# Patient Record
Sex: Female | Born: 1955
Health system: Southern US, Community
[De-identification: ages and names within clinical notes are randomized; demographics above are authoritative.]

## PROBLEM LIST (undated history)

## (undated) DIAGNOSIS — Z803 Family history of malignant neoplasm of breast: Secondary | ICD-10-CM

## (undated) DIAGNOSIS — Q211 Atrial septal defect: Secondary | ICD-10-CM

## (undated) DIAGNOSIS — J321 Chronic frontal sinusitis: Secondary | ICD-10-CM

## (undated) DIAGNOSIS — L409 Psoriasis, unspecified: Secondary | ICD-10-CM

## (undated) DIAGNOSIS — I10 Essential (primary) hypertension: Secondary | ICD-10-CM

## (undated) DIAGNOSIS — E2839 Other primary ovarian failure: Secondary | ICD-10-CM

## (undated) DIAGNOSIS — Z923 Personal history of irradiation: Secondary | ICD-10-CM

## (undated) DIAGNOSIS — K5010 Crohn's disease of large intestine without complications: Secondary | ICD-10-CM

## (undated) DIAGNOSIS — G4730 Sleep apnea, unspecified: Secondary | ICD-10-CM

## (undated) DIAGNOSIS — N952 Postmenopausal atrophic vaginitis: Secondary | ICD-10-CM

## (undated) DIAGNOSIS — F32A Depression, unspecified: Secondary | ICD-10-CM

## (undated) DIAGNOSIS — F909 Attention-deficit hyperactivity disorder, unspecified type: Secondary | ICD-10-CM

## (undated) DIAGNOSIS — K589 Irritable bowel syndrome without diarrhea: Secondary | ICD-10-CM

## (undated) DIAGNOSIS — F324 Major depressive disorder, single episode, in partial remission: Secondary | ICD-10-CM

## (undated) DIAGNOSIS — R42 Dizziness and giddiness: Secondary | ICD-10-CM

## (undated) DIAGNOSIS — I2510 Atherosclerotic heart disease of native coronary artery without angina pectoris: Secondary | ICD-10-CM

## (undated) DIAGNOSIS — Z853 Personal history of malignant neoplasm of breast: Secondary | ICD-10-CM

## (undated) DIAGNOSIS — E785 Hyperlipidemia, unspecified: Secondary | ICD-10-CM

## (undated) DIAGNOSIS — G8929 Other chronic pain: Secondary | ICD-10-CM

## (undated) DIAGNOSIS — Q2112 Patent foramen ovale: Secondary | ICD-10-CM

## (undated) DIAGNOSIS — K219 Gastro-esophageal reflux disease without esophagitis: Secondary | ICD-10-CM

## (undated) DIAGNOSIS — R0602 Shortness of breath: Secondary | ICD-10-CM

## (undated) DIAGNOSIS — I700 Atherosclerosis of aorta: Secondary | ICD-10-CM

## (undated) DIAGNOSIS — R931 Abnormal findings on diagnostic imaging of heart and coronary circulation: Secondary | ICD-10-CM

## (undated) DIAGNOSIS — M8580 Other specified disorders of bone density and structure, unspecified site: Secondary | ICD-10-CM

## (undated) DIAGNOSIS — R059 Cough, unspecified: Secondary | ICD-10-CM

## (undated) DIAGNOSIS — I4891 Unspecified atrial fibrillation: Secondary | ICD-10-CM

## (undated) DIAGNOSIS — G4733 Obstructive sleep apnea (adult) (pediatric): Secondary | ICD-10-CM

## (undated) DIAGNOSIS — D649 Anemia, unspecified: Secondary | ICD-10-CM

## (undated) DIAGNOSIS — J8489 Other specified interstitial pulmonary diseases: Secondary | ICD-10-CM

## (undated) DIAGNOSIS — J849 Interstitial pulmonary disease, unspecified: Secondary | ICD-10-CM

## (undated) DIAGNOSIS — M545 Low back pain, unspecified: Secondary | ICD-10-CM

## (undated) DIAGNOSIS — E039 Hypothyroidism, unspecified: Secondary | ICD-10-CM

## (undated) DIAGNOSIS — R202 Paresthesia of skin: Secondary | ICD-10-CM

## (undated) DIAGNOSIS — K220 Achalasia of cardia: Secondary | ICD-10-CM

## (undated) DIAGNOSIS — E7800 Pure hypercholesterolemia, unspecified: Secondary | ICD-10-CM

## (undated) DIAGNOSIS — J479 Bronchiectasis, uncomplicated: Secondary | ICD-10-CM

## (undated) DIAGNOSIS — M5416 Radiculopathy, lumbar region: Secondary | ICD-10-CM

## (undated) DIAGNOSIS — C50919 Malignant neoplasm of unspecified site of unspecified female breast: Secondary | ICD-10-CM

## (undated) DIAGNOSIS — F439 Reaction to severe stress, unspecified: Secondary | ICD-10-CM

## (undated) DIAGNOSIS — F419 Anxiety disorder, unspecified: Secondary | ICD-10-CM

## (undated) HISTORY — DX: Obstructive sleep apnea (adult) (pediatric): G47.33

## (undated) HISTORY — DX: Other specified interstitial pulmonary diseases: J84.89

## (undated) HISTORY — DX: Postmenopausal atrophic vaginitis: N95.2

## (undated) HISTORY — DX: Irritable bowel syndrome without diarrhea: K58.9

## (undated) HISTORY — DX: Reaction to severe stress, unspecified: F43.9

## (undated) HISTORY — DX: Bronchiectasis, uncomplicated: J47.9

## (undated) HISTORY — DX: Depression, unspecified: F32.A

## (undated) HISTORY — DX: Interstitial pulmonary disease, unspecified: J84.9

## (undated) HISTORY — PX: FACIAL COSMETIC SURGERY: SHX629

## (undated) HISTORY — DX: Other specified disorders of bone density and structure, unspecified site: M85.80

## (undated) HISTORY — PX: BREAST BIOPSY: SHX20

## (undated) HISTORY — DX: Low back pain, unspecified: M54.50

## (undated) HISTORY — DX: Gastro-esophageal reflux disease without esophagitis: K21.9

## (undated) HISTORY — DX: Family history of malignant neoplasm of breast: Z80.3

## (undated) HISTORY — DX: Anemia, unspecified: D64.9

## (undated) HISTORY — PX: BREAST LUMPECTOMY: SHX2

## (undated) HISTORY — DX: Chronic frontal sinusitis: J32.1

## (undated) HISTORY — DX: Radiculopathy, lumbar region: M54.16

## (undated) HISTORY — DX: Shortness of breath: R06.02

## (undated) HISTORY — DX: Other primary ovarian failure: E28.39

## (undated) HISTORY — DX: Achalasia of cardia: K22.0

## (undated) HISTORY — DX: Hyperlipidemia, unspecified: E78.5

## (undated) HISTORY — DX: Cough, unspecified: R05.9

## (undated) HISTORY — DX: Major depressive disorder, single episode, in partial remission: F32.4

## (undated) HISTORY — DX: Other chronic pain: G89.29

## (undated) HISTORY — DX: Essential (primary) hypertension: I10

## (undated) HISTORY — DX: Psoriasis, unspecified: L40.9

## (undated) HISTORY — DX: Crohn's disease of large intestine without complications: K50.10

## (undated) HISTORY — DX: Paresthesia of skin: R20.2

## (undated) HISTORY — DX: Pure hypercholesterolemia, unspecified: E78.00

## (undated) HISTORY — DX: Hypothyroidism, unspecified: E03.9

## (undated) HISTORY — PX: TUBAL LIGATION: SHX77

## (undated) HISTORY — PX: REDUCTION MAMMAPLASTY: SUR839

## (undated) HISTORY — DX: Malignant neoplasm of unspecified site of unspecified female breast: C50.919

## (undated) HISTORY — DX: Unspecified atrial fibrillation: I48.91

## (undated) HISTORY — DX: Personal history of malignant neoplasm of breast: Z85.3

## (undated) HISTORY — DX: Dizziness and giddiness: R42

---
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
## 1998-09-25 ENCOUNTER — Encounter: Payer: Self-pay | Admitting: Pulmonary Disease

## 1998-09-25 ENCOUNTER — Ambulatory Visit (HOSPITAL_COMMUNITY): Admission: RE | Admit: 1998-09-25 | Discharge: 1998-09-25 | Payer: Self-pay | Admitting: Pulmonary Disease

## 1998-09-28 ENCOUNTER — Ambulatory Visit (HOSPITAL_COMMUNITY): Admission: RE | Admit: 1998-09-28 | Discharge: 1998-09-28 | Payer: Self-pay | Admitting: Pulmonary Disease

## 1999-01-10 ENCOUNTER — Encounter: Payer: Self-pay | Admitting: Pulmonary Disease

## 1999-01-10 ENCOUNTER — Encounter (INDEPENDENT_AMBULATORY_CARE_PROVIDER_SITE_OTHER): Payer: Self-pay

## 1999-01-10 ENCOUNTER — Ambulatory Visit (HOSPITAL_COMMUNITY): Admission: RE | Admit: 1999-01-10 | Discharge: 1999-01-10 | Payer: Self-pay | Admitting: Pulmonary Disease

## 1999-04-23 HISTORY — PX: BREAST LUMPECTOMY: SHX2

## 2000-01-16 ENCOUNTER — Encounter: Admission: RE | Admit: 2000-01-16 | Discharge: 2000-01-16 | Payer: Self-pay | Admitting: Gynecology

## 2000-01-16 ENCOUNTER — Encounter: Payer: Self-pay | Admitting: Gynecology

## 2000-01-21 ENCOUNTER — Other Ambulatory Visit: Admission: RE | Admit: 2000-01-21 | Discharge: 2000-01-21 | Payer: Self-pay | Admitting: Gynecology

## 2000-01-21 ENCOUNTER — Encounter: Payer: Self-pay | Admitting: Gynecology

## 2000-01-21 ENCOUNTER — Encounter: Admission: RE | Admit: 2000-01-21 | Discharge: 2000-01-21 | Payer: Self-pay | Admitting: Gynecology

## 2000-01-21 ENCOUNTER — Encounter (INDEPENDENT_AMBULATORY_CARE_PROVIDER_SITE_OTHER): Payer: Self-pay | Admitting: *Deleted

## 2000-01-24 ENCOUNTER — Ambulatory Visit (HOSPITAL_BASED_OUTPATIENT_CLINIC_OR_DEPARTMENT_OTHER): Admission: RE | Admit: 2000-01-24 | Discharge: 2000-01-24 | Payer: Self-pay

## 2000-01-30 ENCOUNTER — Encounter: Admission: RE | Admit: 2000-01-30 | Discharge: 2000-04-29 | Payer: Self-pay | Admitting: Radiation Oncology

## 2000-05-30 ENCOUNTER — Ambulatory Visit (HOSPITAL_COMMUNITY): Admission: RE | Admit: 2000-05-30 | Discharge: 2000-05-30 | Payer: Self-pay | Admitting: Gynecology

## 2000-07-10 ENCOUNTER — Encounter: Admission: RE | Admit: 2000-07-10 | Discharge: 2000-07-10 | Payer: Self-pay

## 2000-11-18 ENCOUNTER — Other Ambulatory Visit: Admission: RE | Admit: 2000-11-18 | Discharge: 2000-11-18 | Payer: Self-pay | Admitting: Gynecology

## 2001-03-27 ENCOUNTER — Encounter: Admission: RE | Admit: 2001-03-27 | Discharge: 2001-03-27 | Payer: Self-pay | Admitting: Family Medicine

## 2001-03-27 ENCOUNTER — Encounter: Payer: Self-pay | Admitting: Family Medicine

## 2002-01-18 ENCOUNTER — Other Ambulatory Visit: Admission: RE | Admit: 2002-01-18 | Discharge: 2002-01-18 | Payer: Self-pay | Admitting: Gynecology

## 2002-04-01 ENCOUNTER — Encounter: Payer: Self-pay | Admitting: Family Medicine

## 2002-04-01 ENCOUNTER — Encounter: Admission: RE | Admit: 2002-04-01 | Discharge: 2002-04-01 | Payer: Self-pay | Admitting: Family Medicine

## 2003-01-24 ENCOUNTER — Other Ambulatory Visit: Admission: RE | Admit: 2003-01-24 | Discharge: 2003-01-24 | Payer: Self-pay | Admitting: Gynecology

## 2003-04-04 ENCOUNTER — Encounter: Admission: RE | Admit: 2003-04-04 | Discharge: 2003-04-04 | Payer: Self-pay | Admitting: Gynecology

## 2003-07-18 ENCOUNTER — Encounter: Admission: RE | Admit: 2003-07-18 | Discharge: 2003-07-18 | Payer: Self-pay | Admitting: Family Medicine

## 2004-01-27 ENCOUNTER — Other Ambulatory Visit: Admission: RE | Admit: 2004-01-27 | Discharge: 2004-01-27 | Payer: Self-pay | Admitting: Gynecology

## 2004-04-06 ENCOUNTER — Ambulatory Visit (HOSPITAL_BASED_OUTPATIENT_CLINIC_OR_DEPARTMENT_OTHER): Admission: RE | Admit: 2004-04-06 | Discharge: 2004-04-06 | Payer: Self-pay | Admitting: Family Medicine

## 2004-04-17 ENCOUNTER — Encounter: Admission: RE | Admit: 2004-04-17 | Discharge: 2004-04-17 | Payer: Self-pay | Admitting: Family Medicine

## 2004-04-18 ENCOUNTER — Encounter: Admission: RE | Admit: 2004-04-18 | Discharge: 2004-04-18 | Payer: Self-pay | Admitting: Family Medicine

## 2005-02-06 ENCOUNTER — Other Ambulatory Visit: Admission: RE | Admit: 2005-02-06 | Discharge: 2005-02-06 | Payer: Self-pay | Admitting: Gynecology

## 2005-04-29 ENCOUNTER — Encounter: Admission: RE | Admit: 2005-04-29 | Discharge: 2005-04-29 | Payer: Self-pay | Admitting: Family Medicine

## 2005-10-04 ENCOUNTER — Ambulatory Visit: Payer: Self-pay | Admitting: Internal Medicine

## 2006-03-31 ENCOUNTER — Other Ambulatory Visit: Admission: RE | Admit: 2006-03-31 | Discharge: 2006-03-31 | Payer: Self-pay | Admitting: Gynecology

## 2006-05-23 ENCOUNTER — Ambulatory Visit (HOSPITAL_COMMUNITY): Admission: RE | Admit: 2006-05-23 | Discharge: 2006-05-23 | Payer: Self-pay | Admitting: Gastroenterology

## 2006-06-03 ENCOUNTER — Encounter: Admission: RE | Admit: 2006-06-03 | Discharge: 2006-06-03 | Payer: Self-pay | Admitting: Gynecology

## 2007-02-25 ENCOUNTER — Encounter: Admission: RE | Admit: 2007-02-25 | Discharge: 2007-02-25 | Payer: Self-pay | Admitting: Family Medicine

## 2007-06-10 ENCOUNTER — Encounter: Admission: RE | Admit: 2007-06-10 | Discharge: 2007-06-10 | Payer: Self-pay | Admitting: Family Medicine

## 2008-06-10 ENCOUNTER — Encounter: Admission: RE | Admit: 2008-06-10 | Discharge: 2008-06-10 | Payer: Self-pay | Admitting: Gynecology

## 2009-06-12 ENCOUNTER — Encounter: Admission: RE | Admit: 2009-06-12 | Discharge: 2009-06-12 | Payer: Self-pay | Admitting: Family Medicine

## 2010-05-13 ENCOUNTER — Encounter: Payer: Self-pay | Admitting: Gynecology

## 2010-06-07 ENCOUNTER — Other Ambulatory Visit: Payer: Self-pay | Admitting: Gynecology

## 2010-06-21 ENCOUNTER — Other Ambulatory Visit: Payer: Self-pay | Admitting: Family Medicine

## 2010-06-21 DIAGNOSIS — Z1231 Encounter for screening mammogram for malignant neoplasm of breast: Secondary | ICD-10-CM

## 2010-07-11 ENCOUNTER — Ambulatory Visit: Payer: Self-pay

## 2010-08-30 ENCOUNTER — Ambulatory Visit
Admission: RE | Admit: 2010-08-30 | Discharge: 2010-08-30 | Disposition: A | Discharge status: Home / Self Care | Payer: BC Managed Care – PPO | Source: Ambulatory Visit | Attending: Family Medicine | Admitting: Family Medicine

## 2010-08-30 DIAGNOSIS — Z1231 Encounter for screening mammogram for malignant neoplasm of breast: Secondary | ICD-10-CM

## 2010-09-03 ENCOUNTER — Other Ambulatory Visit: Payer: Self-pay | Admitting: Gynecology

## 2010-09-07 NOTE — Op Note (Signed)
[place]  Patient:    Amber Ross, Amber Ross                       MRN: [id number] Proc. Date: 01/24/00 Adm. Date:  [id number] Attending:  Gennie Alma CC:         Gretta Cool, M.D.  Desma Maxim, M.D.  Fabio Asa, M.D.   Operative Report  CCS# [id number]  PREOPERATIVE DIAGNOSIS:  Intraductal carcinoma of left breast associated with abnormal calcifications--12 oclock radial.  POSTOPERATIVE DIAGNOSIS:  Intraductal carcinoma of left breast associated with abnormal calcifications--12 oclock radial.  OPERATION PERFORMED:  Needle localized left breast biopsy.  SURGEON:  Milus Mallick, M.D.  ANESTHESIA:  Local infiltration with 1% Xylocaine 25 cc and monitored anesthesia care.  DESCRIPTION OF PROCEDURE:  Under adequate perioperative intravenous sedation, the patients left breast was prepped and draped in the usual fashion.  There was a skinny wire protruding from the 12 oclock radial of the left breast having been placed there by Dr. Fabio Asa for the purposes of localizing some abnormal calcifications that had been biopsied by her and found to contain intraductal carcinoma.  Just adjacent to the wire was a large core needle tract of the previous biopsy.  An elliptical incision was made to include the wire and the needle tract and it was approximately 6 cm in length. Bleeders were electrocoagulated.  Superior and inferior skin flaps were developed using scalpel.  Next, a segment of breast tissue was excised to include the wire and surrounding tissue.  Bleeders were electrocoagulated and in one incidence suture ligated with 3-0 chromic catgut.  The specimen was sent for speciman mammography which confirmed the presence of all the calcifications within the biopsy site.  Hemostasis was ascertained. Subcuticular layer was reapproximated with continuous suture of 5-0 Vicryl. Half-inch Steri-Strips were applied to the skin,  sterile dressing was applied. Estimated blood loss for this procedure was approximately 100 cc.  The patient tolerated the procedure well and left the operating room in satisfactory condition. DD:  01/24/00 TD:  01/25/00 Job: [id number]

## 2010-09-07 NOTE — Op Note (Signed)
[facility]  Patient:    Amber Ross, Amber Ross                       MRN: [id number] Proc. Date: 05/30/00 Adm. Date:  [id number] Disc. Date: [id number] Attending:  Katrina Stack CC:         Desma Maxim, M.D.   Operative Report  PREOPERATIVE DIAGNOSES: 1. Desires sterilization. 2. Breast cancer, no longer candidate for birth control pills.  POSTOPERATIVE DIAGNOSES: 1. Desires sterilization. 2. Breast cancer, no longer candidate for birth control pills.  PROCEDURE:  Filches clip sterilization.  SURGEON:  Gretta Cool, M.D.  ANESTHESIA:  IV sedation plus topical and local infiltration with Marcaine 0.5%.  DESCRIPTION OF PROCEDURE:  Under excellent IV sedation with local infiltration and application as above the patients abdomen was prepped and draped as a sterile field.  Allen stirrups were used.  Her bladder was emptied.  Hulka tenaculum was applied to her cervix and patient prepped and draped for laparoscopy.  The skin was then infiltrated with Marcaine 0.5%.  The infiltration was taken to the level of the peritoneum.  Veress cannula was then introduced and adequate pneumoperitoneum obtained with nitrous oxide. The abdominal peritoneum was then entered by the laparoscope trocar and pelvic organs visualized.  There were some significant adhesions over the right ovary, filmy in nature.  No evidence of endometriosis, ovarian disease, or any other intra-abdominal pathology was identified.  At this point the tubes were traced to the fimbriated end.  They were then grasped approximately 1 cm lateral to the uterine insertion and filches clip band sterilization clip applied across the mesosalpinx so that the full width of fallopian tube was included in the clip.  Photographs were made.  At this point the gas was allowed to escape and the incision closed with deep suture of 5-0 Dexon, skin closed with Steri-Strips.  At the end of the procedure  sponge and lap counts were correct.  No complications.  Patient returned to recovery room in excellent condition.DD:  05/30/00 TD:  06/01/00 Job: [id number]

## 2010-09-07 NOTE — Procedures (Signed)
NAME:  Amber Ross, Amber Ross                ACCOUNT NO.:  [id number]   MEDICAL RECORD NO.:  [id number]          PATIENT TYPE:  OUT   LOCATION:  SLEEP CENTER                 FACILITY:  [facility]   PHYSICIAN:  Clinton D. Maple Hudson, M.D. DATE OF BIRTH:  March 11, 1956   DATE OF STUDY:  04/06/2004                              NOCTURNAL POLYSOMNOGRAM   STUDY DATE:  April 06, 2004   REFERRING PHYSICIAN:  Donia Guiles, M.D.   INDICATION FOR STUDY:  Hypersomnia with sleep apnea.   NECK SIZE:  14-1/2 inches   BODY MASS INDEX:  28.7   WEIGHT:  190 pounds   EPWORTH SLEEPINESS SCORE:  12/24   SLEEP ARCHITECTURE:  Total sleep time 302 minutes with sleep efficiency 67%.  Stage I was 17%, stage II 77%, stages III and IV were absent, REM was 7% of  total sleep time.  Sleep latency was 19 minutes, REM latency was 405  minutes, awake after sleep onset 129 minutes, arousal index 51.2 which is  increased.   RESPIRATORY DATA:  Split-study protocol.  RDI 7.3/hr indicating mild  obstructive sleep apnea/hypopnea syndrome before CPAP.  This reflected 15  hypopneas before CPAP.  Events were not positional.  REM RDI was 5.9/hr.  CPAP was titrated to 9 CWP, RDI 2.3/hr using a small Respironics ComfortGel  Mask.  The technician suggested use of a chin strap or full face mask  because of leaks during REM but it may be that a lower pressure would be a  better approach, 8 CWP gave an RDI of 2.7/hr.   OXYGEN DATA:  Moderate snoring with oxygen desaturation to a nadir of 92%  during events.  Mean oxygen saturation through the study was 97%.   CARDIAC DATA:  Normal sinus rhythm.   MOVEMENT/PARASOMNIA:  A total of 93 limb jerks were recorded of which 33  were associated with arousal or awakening for a periodic limb movement with  arousal index of 6.5/hr which is mildly increased and possibly clinically  significant.   IMPRESSION/RECOMMENDATION:  1.  Very mild obstructive sleep apnea/hypopnea syndrome, respiratory  disturbance index 7.3/hr reflecting nonpositional hypopneas with normal      oxygenation.  Continuous positive airway pressure was titrated to 9 CWP,      respiratory disturbance index of 2.3/hr.  There were a few air leaks      reported at this pressure and it may be best to start at home with 8 CWP      if continuous positive airway pressure therapy is to be utilized.  The      technician used a small Respironics ComfortGel Mask.  2.  Periodic limb movement with arousal, PLMA 6.5/hr.                                                           Joni Fears D. Maple Hudson, M.D.  Diplomate, [employer] Board   CDY/MEDQ  D:  04/15/2004 56:43:32  T:  04/15/2004 18:45:46  Job:  [id number] 

## 2011-09-09 ENCOUNTER — Other Ambulatory Visit: Payer: Self-pay | Admitting: Family Medicine

## 2011-09-09 DIAGNOSIS — Z1231 Encounter for screening mammogram for malignant neoplasm of breast: Secondary | ICD-10-CM

## 2011-09-11 ENCOUNTER — Other Ambulatory Visit: Payer: Self-pay | Admitting: Gynecology

## 2011-09-17 ENCOUNTER — Ambulatory Visit
Admission: RE | Admit: 2011-09-17 | Discharge: 2011-09-17 | Disposition: A | Discharge status: Home / Self Care | Payer: BC Managed Care – PPO | Source: Ambulatory Visit | Attending: Family Medicine | Admitting: Family Medicine

## 2011-09-17 DIAGNOSIS — Z1231 Encounter for screening mammogram for malignant neoplasm of breast: Secondary | ICD-10-CM

## 2012-11-20 ENCOUNTER — Other Ambulatory Visit: Payer: Self-pay

## 2012-11-20 DIAGNOSIS — Z1231 Encounter for screening mammogram for malignant neoplasm of breast: Secondary | ICD-10-CM

## 2012-12-09 ENCOUNTER — Ambulatory Visit
Admission: RE | Admit: 2012-12-09 | Discharge: 2012-12-09 | Disposition: A | Discharge status: Home / Self Care | Payer: BC Managed Care – PPO | Source: Ambulatory Visit

## 2012-12-09 DIAGNOSIS — Z1231 Encounter for screening mammogram for malignant neoplasm of breast: Secondary | ICD-10-CM

## 2013-11-17 ENCOUNTER — Ambulatory Visit (INDEPENDENT_AMBULATORY_CARE_PROVIDER_SITE_OTHER)
Admission: RE | Admit: 2013-11-17 | Discharge: 2013-11-17 | Disposition: A | Discharge status: Home / Self Care | Payer: BC Managed Care – PPO | Source: Ambulatory Visit | Attending: Internal Medicine | Admitting: Internal Medicine

## 2013-11-17 ENCOUNTER — Encounter: Payer: Self-pay | Admitting: Internal Medicine

## 2013-11-17 ENCOUNTER — Ambulatory Visit (INDEPENDENT_AMBULATORY_CARE_PROVIDER_SITE_OTHER): Payer: BC Managed Care – PPO | Admitting: Internal Medicine

## 2013-11-17 ENCOUNTER — Encounter (INDEPENDENT_AMBULATORY_CARE_PROVIDER_SITE_OTHER): Payer: Self-pay

## 2013-11-17 VITALS — BP 126/90 | HR 80 | Temp 98.2°F | Ht 68.0 in | Wt 187.0 lb

## 2013-11-17 DIAGNOSIS — R059 Cough, unspecified: Secondary | ICD-10-CM

## 2013-11-17 DIAGNOSIS — R05 Cough: Secondary | ICD-10-CM

## 2013-11-17 DIAGNOSIS — R058 Other specified cough: Secondary | ICD-10-CM

## 2013-11-17 MED ORDER — FAMOTIDINE 20 MG PO TABS: ORAL_TABLET | ORAL | Status: DC

## 2013-11-17 MED ORDER — PANTOPRAZOLE SODIUM 40 MG PO TBEC: 40.0000 mg | DELAYED_RELEASE_TABLET | Freq: Every day | ORAL | Status: DC

## 2013-11-17 MED ORDER — GABAPENTIN 100 MG PO CAPS: 100.0000 mg | ORAL_CAPSULE | Freq: Three times a day (TID) | ORAL | Status: DC

## 2013-11-17 NOTE — Patient Instructions (Addendum)
Neurontin (gabapentin) 100 mg three times daily  with meals   Pantoprazole (protonix) 40 mg   Take 30-60 min before first meal of the day and Pepcid 20 mg one bedtime until return to office - this is the best way to tell whether stomach acid is contributing to your problem.    For drainage/ tickle take chlortrimeton (chlorpheniramine) 4 mg every 4 hours available over the counter (may cause drowsiness)   GERD (REFLUX)  is an extremely common cause of respiratory symptoms, many times with no significant heartburn at all.    It can be treated with medication, but also with lifestyle changes including avoidance of late meals, excessive alcohol, smoking cessation, and avoid fatty foods, chocolate, peppermint, colas, red wine, and acidic juices such as orange juice.  NO MINT OR MENTHOL PRODUCTS SO NO COUGH DROPS  USE SUGARLESS CANDY INSTEAD (jolley ranchers or Stover's)  NO OIL BASED VITAMINS - use powdered substitutes.    Please remember to go to the x-ray department downstairs for your tests - we will call you with the results when they are available.  Please schedule a follow up office visit in 6 weeks, call sooner if needed

## 2013-11-17 NOTE — Progress Notes (Signed)
Subjective:    Patient ID: Amber Ross, female    DOB: Nov 14, 1955   MRN: [id number]  HPI  68 yowf quit smoking 1991 started cough in 1995 and ever since daily eval in pulmonary clinic around 2005 rx for UACS "never better"even transiently referred back to pulmonary clinic by Dr Derrill Memo on 11/17/2013     11/17/2013 1st [facility] office visit/ Amber Ross  Chief Complaint  Patient presents with  . Pulmonary Consult    Self referral. Pt seen here approx 10 yrs ago. She c/o cough for the past 22 yrs. She states that cough is non prod and "constant" but seems worse when she is working during the day.    dry cough is daily x 22 y, not present while supine or sleep Not limited by breathing from desired activities   ? Hb from excessive cough, also urinary incontinence    Kouffman Reflux v Neurogenic Cough Differentiator Reflux Comments  Do you awaken from a sound sleep coughing violently?                            With trouble breathing? no   Do you have choking episodes when you cannot  Get enough air, gasping for air ?              Yes   Do you usually cough when you lie down into  The bed, or when you just lie down to rest ?                          no   Do you usually cough after meals or eating?         no   Do you cough when (or after) you bend over?    no   GERD SCORE     Kouffman Reflux v Neurogenic Cough Differentiator Neurogenic   Do you more-or-less cough all day long? yes   Does change of temperature make you cough? yes   Does laughing or chuckling cause you to cough? yes   Do fumes (perfume, automobile fumes, burned  Toast, etc.,) cause you to cough ?      yes   Does speaking, singing, or talking on the phone cause you to cough   ?               Yes    Neurogenic/Airway score       No obvious other patterns in day to day or daytime variabilty or assoc  cp or chest tightness, subjective wheeze overt sinus  symptoms. No unusual exp hx or h/o childhood pna/ asthma or  knowledge of premature birth.  Sleeping ok without nocturnal  or early am exacerbation  of respiratory  c/o's or need for noct saba. Also denies any obvious fluctuation of symptoms with weather or environmental changes or other aggravating or alleviating factors except as outlined above   Current Medications, Allergies, Complete Past Medical History, Past Surgical History, Family History, and Social History were reviewed in [employer] record.           Review of Systems  Constitutional: Negative for fever, chills and unexpected weight change.  HENT: Negative for congestion, dental problem, ear pain, nosebleeds, postnasal drip, rhinorrhea, sinus pressure, sneezing, sore throat, trouble swallowing and voice change.   Eyes: Negative for visual disturbance.  Respiratory: Positive for cough. Negative for choking and shortness  of breath.   Cardiovascular: Negative for chest pain and leg swelling.  Gastrointestinal: Negative for vomiting, abdominal pain and diarrhea.  Genitourinary: Negative for difficulty urinating.       Acid heartburn  Musculoskeletal: Negative for arthralgias.  Skin: Negative for rash.  Neurological: Negative for tremors, syncope and headaches.  Hematological: Does not bruise/bleed easily.       Objective:   Physical Exam  amb wm   Wt Readings from Last 3 Encounters:  11/17/13 187 lb (84.823 kg)      HEENT: nl dentition, turbinates, and orophanx. Nl external ear canals without cough reflex   NECK :  without JVD/Nodes/TM/ nl carotid upstrokes bilaterally   LUNGS: no acc muscle use, clear to A and P bilaterally without cough on insp or exp maneuvers   CV:  RRR  no s3 or murmur or increase in P2, no edema   ABD:  soft and nontender with nl excursion in the supine position. No bruits or organomegaly, bowel sounds nl  MS:  warm without deformities, calf tenderness, cyanosis or clubbing  SKIN: warm and dry without lesions    NEURO:   alert, approp, no deficits    CXR  11/17/2013 :  No active cardiopulmonary disease.        Assessment & Plan:

## 2013-11-17 NOTE — Progress Notes (Signed)
Quick Note:  Spoke with pt and notified of results per Dr. Wert. Pt verbalized understanding and denied any questions.  ______ 

## 2013-11-18 NOTE — Assessment & Plan Note (Signed)
The most common causes of chronic cough in immunocompetent adults include the following: upper airway cough syndrome (UACS), previously referred to as postnasal drip syndrome (PNDS), which is caused by variety of rhinosinus conditions; (2) asthma; (3) GERD; (4) chronic bronchitis from cigarette smoking or other inhaled environmental irritants; (5) nonasthmatic eosinophilic bronchitis; and (6) bronchiectasis.   These conditions, singly or in combination, have accounted for up to 94% of the causes of chronic cough in prospective studies.   Other conditions have constituted no >6% of the causes in prospective studies These have included bronchogenic carcinoma, chronic interstitial pneumonia, sarcoidosis, left ventricular failure, ACEI-induced cough, and aspiration from a condition associated with pharyngeal dysfunction.    Chronic cough is often simultaneously caused by more than one condition. A single cause has been found from 38 to 82% of the time, multiple causes from 18 to 62%. Multiply caused cough has been the result of three diseases up to 42% of the time.       Based on hx and exam, this is most likely:  Classic Upper airway cough syndrome, so named because it's frequently impossible to sort out how much is  CR/sinusitis with freq throat clearing (which can be related to primary GERD)   vs  causing  secondary (" extra esophageal")  GERD from wide swings in gastric pressure that occur with throat clearing, often  promoting self use of mint and menthol lozenges that reduce the lower esophageal sphincter tone and exacerbate the problem further in a cyclical fashion.   These are the same pts (now being labeled as having "irritable larynx syndrome" by some cough centers) who not infrequently have a history of having failed to tolerate ace inhibitors,  dry powder inhalers or biphosphonates or report having atypical reflux symptoms that don't respond to standard doses of PPI , and are easily confused as  having aecopd or asthma flares by even experienced allergists/ pulmonologists.   The first step is to maximize acid suppression and eliminate cyclical coughing with neurontin  then regroup in 4 weeks.  See instructions for specific recommendations which were reviewed directly with the patient who was given a copy with highlighter outlining the key components.

## 2013-12-15 ENCOUNTER — Other Ambulatory Visit: Payer: Self-pay

## 2013-12-15 DIAGNOSIS — Z1231 Encounter for screening mammogram for malignant neoplasm of breast: Secondary | ICD-10-CM

## 2013-12-29 ENCOUNTER — Ambulatory Visit: Payer: BC Managed Care – PPO | Admitting: Internal Medicine

## 2013-12-30 ENCOUNTER — Ambulatory Visit: Payer: BC Managed Care – PPO

## 2014-01-04 ENCOUNTER — Ambulatory Visit
Admission: RE | Admit: 2014-01-04 | Discharge: 2014-01-04 | Disposition: A | Discharge status: Home / Self Care | Payer: BC Managed Care – PPO | Source: Ambulatory Visit

## 2014-01-04 DIAGNOSIS — Z1231 Encounter for screening mammogram for malignant neoplasm of breast: Secondary | ICD-10-CM

## 2015-02-20 ENCOUNTER — Other Ambulatory Visit: Payer: Self-pay

## 2015-02-20 DIAGNOSIS — Z1231 Encounter for screening mammogram for malignant neoplasm of breast: Secondary | ICD-10-CM

## 2015-03-24 ENCOUNTER — Ambulatory Visit
Admission: RE | Admit: 2015-03-24 | Discharge: 2015-03-24 | Disposition: A | Discharge status: Home / Self Care | Payer: BLUE CROSS/BLUE SHIELD | Source: Ambulatory Visit

## 2015-03-24 DIAGNOSIS — Z1231 Encounter for screening mammogram for malignant neoplasm of breast: Secondary | ICD-10-CM

## 2015-08-10 ENCOUNTER — Ambulatory Visit (INDEPENDENT_AMBULATORY_CARE_PROVIDER_SITE_OTHER): Payer: BLUE CROSS/BLUE SHIELD | Admitting: Physician Assistant

## 2015-08-10 VITALS — BP 130/80 | HR 80 | Temp 97.8°F | Resp 16 | Ht 66.75 in | Wt 153.0 lb

## 2015-08-10 DIAGNOSIS — Z853 Personal history of malignant neoplasm of breast: Secondary | ICD-10-CM | POA: Diagnosis not present

## 2015-08-10 DIAGNOSIS — D509 Iron deficiency anemia, unspecified: Secondary | ICD-10-CM

## 2015-08-10 DIAGNOSIS — Z130 Encounter for screening for diseases of the blood and blood-forming organs and certain disorders involving the immune mechanism: Secondary | ICD-10-CM

## 2015-08-10 LAB — POCT CBC
GRANULOCYTE PERCENT: 61.8 % (ref 37–80)
HCT, POC: 41.5 % (ref 37.7–47.9)
Hemoglobin: 14.6 g/dL (ref 12.2–16.2)
Lymph, poc: 2.1 (ref 0.6–3.4)
MCH, POC: 29.1 pg (ref 27–31.2)
MCHC: 35.1 g/dL (ref 31.8–35.4)
MCV: 83 fL (ref 80–97)
MID (cbc): 0.4 (ref 0–0.9)
MPV: 9.6 fL (ref 0–99.8)
PLATELET COUNT, POC: 134 10*3/uL — AB (ref 142–424)
POC Granulocyte: 4.1 (ref 2–6.9)
POC LYMPH %: 31.6 % (ref 10–50)
POC MID %: 6.6 %M (ref 0–12)
RBC: 5 M/uL (ref 4.04–5.48)
RDW, POC: 13.9 %
WBC: 6.7 10*3/uL (ref 4.6–10.2)

## 2015-08-10 NOTE — Progress Notes (Signed)
   Amber Ross  MRN: [id number] DOB: 03-16-1956  Subjective:  Pt presents to clinic for lab work prior to an elective face lift.  She needs to have a CBC prior to her surgery.  Surgery is schedule for 5/8.  Patient Active Problem List   Diagnosis Date Noted  . History of breast cancer in female 08/10/2015  . Upper airway cough syndrome 11/17/2013    Current Outpatient Prescriptions on File Prior to Visit  Medication Sig Dispense Refill  . venlafaxine XR (EFFEXOR XR) 150 MG 24 hr capsule 1 capsule with food     No current facility-administered medications on file prior to visit.    No Known Allergies  Review of Systems  Constitutional: Negative for fever and chills.   Objective:  BP 130/80 mmHg  Pulse 80  Temp(Src) 97.8 F (36.6 C)  Resp 16  Ht 5' 6.75" (1.695 m)  Wt 153 lb (69.4 kg)  BMI 24.16 kg/m2  Physical Exam  Constitutional: She is oriented to person, place, and time and well-developed, well-nourished, and in no distress.  HENT:  Head: Normocephalic and atraumatic.  Right Ear: Hearing and external ear normal.  Left Ear: Hearing and external ear normal.  Eyes: Conjunctivae are normal.  Neck: Normal range of motion.  Pulmonary/Chest: Effort normal.  Neurological: She is alert and oriented to person, place, and time. Gait normal.  Skin: Skin is warm and dry.  Psychiatric: Mood, memory, affect and judgment normal.  Vitals reviewed.  Results for orders placed or performed in visit on 08/10/15  POCT CBC  Result Value Ref Range   WBC 6.7 4.6 - 10.2 K/uL   Lymph, poc 2.1 0.6 - 3.4   POC LYMPH PERCENT 31.6 10 - 50 %L   MID (cbc) 0.4 0 - 0.9   POC MID % 6.6 0 - 12 %M   POC Granulocyte 4.1 2 - 6.9   Granulocyte percent 61.8 37 - 80 %G   RBC 5.00 4.04 - 5.48 M/uL   Hemoglobin 14.6 12.2 - 16.2 g/dL   HCT, POC 41.5 37.7 - 47.9 %   MCV 83.0 80 - 97 fL   MCH, POC 29.1 27 - 31.2 pg   MCHC 35.1 31.8 - 35.4 g/dL   RDW, POC 13.9 %   Platelet Count, POC 134 (A)  142 - 424 K/uL   MPV 9.6 0 - 99.8 fL    Assessment and Plan :  Iron deficiency anemia, unspecified  History of breast cancer in female  Screening for deficiency anemia - Plan: POCT CBC   Gave copy of lab to patient as well as faxed to Dr Stephanie Coup.  Windell Hummingbird PA-C  [facility] 08/10/2015 9:28 AM

## 2015-08-10 NOTE — Patient Instructions (Signed)
     IF you received an x-ray today, you will receive an invoice from [facility]. Please contact [facility] at [phone] with questions or concerns regarding your invoice.   IF you received labwork today, you will receive an invoice from [facility]. Please contact [facility] at [phone] with questions or concerns regarding your invoice.   Our billing staff will not be able to assist you with questions regarding bills from these companies.  You will be contacted with the lab results as soon as they are available. The fastest way to get your results is to activate your My Chart account. Instructions are located on the last page of this paperwork. If you have not heard from us regarding the results in 2 weeks, please contact this office.      

## 2016-05-03 ENCOUNTER — Other Ambulatory Visit: Payer: Self-pay | Admitting: Gynecology

## 2016-05-03 DIAGNOSIS — Z1231 Encounter for screening mammogram for malignant neoplasm of breast: Secondary | ICD-10-CM

## 2016-05-10 ENCOUNTER — Ambulatory Visit: Payer: BLUE CROSS/BLUE SHIELD

## 2016-05-31 ENCOUNTER — Ambulatory Visit
Admission: RE | Admit: 2016-05-31 | Discharge: 2016-05-31 | Disposition: A | Discharge status: Home / Self Care | Payer: BLUE CROSS/BLUE SHIELD | Source: Ambulatory Visit | Attending: Gynecology | Admitting: Gynecology

## 2016-05-31 DIAGNOSIS — Z1231 Encounter for screening mammogram for malignant neoplasm of breast: Secondary | ICD-10-CM

## 2017-03-04 DIAGNOSIS — R6884 Jaw pain: Secondary | ICD-10-CM | POA: Diagnosis not present

## 2017-03-04 DIAGNOSIS — R1013 Epigastric pain: Secondary | ICD-10-CM | POA: Diagnosis not present

## 2017-03-04 DIAGNOSIS — K219 Gastro-esophageal reflux disease without esophagitis: Secondary | ICD-10-CM | POA: Diagnosis not present

## 2017-05-27 ENCOUNTER — Other Ambulatory Visit: Payer: Self-pay | Admitting: Gynecology

## 2017-05-27 DIAGNOSIS — Z1231 Encounter for screening mammogram for malignant neoplasm of breast: Secondary | ICD-10-CM

## 2017-06-13 ENCOUNTER — Ambulatory Visit
Admission: RE | Admit: 2017-06-13 | Discharge: 2017-06-13 | Disposition: A | Discharge status: Home / Self Care | Payer: BLUE CROSS/BLUE SHIELD | Source: Ambulatory Visit | Attending: Gynecology | Admitting: Gynecology

## 2017-06-13 DIAGNOSIS — Z1231 Encounter for screening mammogram for malignant neoplasm of breast: Secondary | ICD-10-CM | POA: Diagnosis not present

## 2017-06-13 HISTORY — DX: Personal history of irradiation: Z92.3

## 2017-06-24 DIAGNOSIS — Z1211 Encounter for screening for malignant neoplasm of colon: Secondary | ICD-10-CM | POA: Diagnosis not present

## 2017-06-24 DIAGNOSIS — K5900 Constipation, unspecified: Secondary | ICD-10-CM | POA: Diagnosis not present

## 2017-07-01 DIAGNOSIS — M9903 Segmental and somatic dysfunction of lumbar region: Secondary | ICD-10-CM | POA: Diagnosis not present

## 2017-07-01 DIAGNOSIS — M9901 Segmental and somatic dysfunction of cervical region: Secondary | ICD-10-CM | POA: Diagnosis not present

## 2017-07-01 DIAGNOSIS — M9902 Segmental and somatic dysfunction of thoracic region: Secondary | ICD-10-CM | POA: Diagnosis not present

## 2017-07-01 DIAGNOSIS — M47812 Spondylosis without myelopathy or radiculopathy, cervical region: Secondary | ICD-10-CM | POA: Diagnosis not present

## 2017-07-02 DIAGNOSIS — M9902 Segmental and somatic dysfunction of thoracic region: Secondary | ICD-10-CM | POA: Diagnosis not present

## 2017-07-02 DIAGNOSIS — M9903 Segmental and somatic dysfunction of lumbar region: Secondary | ICD-10-CM | POA: Diagnosis not present

## 2017-07-02 DIAGNOSIS — M9901 Segmental and somatic dysfunction of cervical region: Secondary | ICD-10-CM | POA: Diagnosis not present

## 2017-07-02 DIAGNOSIS — M47812 Spondylosis without myelopathy or radiculopathy, cervical region: Secondary | ICD-10-CM | POA: Diagnosis not present

## 2017-07-03 DIAGNOSIS — M9901 Segmental and somatic dysfunction of cervical region: Secondary | ICD-10-CM | POA: Diagnosis not present

## 2017-07-03 DIAGNOSIS — M9903 Segmental and somatic dysfunction of lumbar region: Secondary | ICD-10-CM | POA: Diagnosis not present

## 2017-07-03 DIAGNOSIS — M47812 Spondylosis without myelopathy or radiculopathy, cervical region: Secondary | ICD-10-CM | POA: Diagnosis not present

## 2017-07-03 DIAGNOSIS — M9902 Segmental and somatic dysfunction of thoracic region: Secondary | ICD-10-CM | POA: Diagnosis not present

## 2017-07-07 DIAGNOSIS — M9901 Segmental and somatic dysfunction of cervical region: Secondary | ICD-10-CM | POA: Diagnosis not present

## 2017-07-07 DIAGNOSIS — M9903 Segmental and somatic dysfunction of lumbar region: Secondary | ICD-10-CM | POA: Diagnosis not present

## 2017-07-07 DIAGNOSIS — M9902 Segmental and somatic dysfunction of thoracic region: Secondary | ICD-10-CM | POA: Diagnosis not present

## 2017-07-07 DIAGNOSIS — M47812 Spondylosis without myelopathy or radiculopathy, cervical region: Secondary | ICD-10-CM | POA: Diagnosis not present

## 2017-07-08 DIAGNOSIS — M9902 Segmental and somatic dysfunction of thoracic region: Secondary | ICD-10-CM | POA: Diagnosis not present

## 2017-07-08 DIAGNOSIS — M47812 Spondylosis without myelopathy or radiculopathy, cervical region: Secondary | ICD-10-CM | POA: Diagnosis not present

## 2017-07-08 DIAGNOSIS — M9901 Segmental and somatic dysfunction of cervical region: Secondary | ICD-10-CM | POA: Diagnosis not present

## 2017-07-08 DIAGNOSIS — M9903 Segmental and somatic dysfunction of lumbar region: Secondary | ICD-10-CM | POA: Diagnosis not present

## 2017-07-09 DIAGNOSIS — M47812 Spondylosis without myelopathy or radiculopathy, cervical region: Secondary | ICD-10-CM | POA: Diagnosis not present

## 2017-07-09 DIAGNOSIS — M9903 Segmental and somatic dysfunction of lumbar region: Secondary | ICD-10-CM | POA: Diagnosis not present

## 2017-07-09 DIAGNOSIS — M9901 Segmental and somatic dysfunction of cervical region: Secondary | ICD-10-CM | POA: Diagnosis not present

## 2017-07-09 DIAGNOSIS — M9902 Segmental and somatic dysfunction of thoracic region: Secondary | ICD-10-CM | POA: Diagnosis not present

## 2017-07-14 DIAGNOSIS — M9903 Segmental and somatic dysfunction of lumbar region: Secondary | ICD-10-CM | POA: Diagnosis not present

## 2017-07-14 DIAGNOSIS — M9902 Segmental and somatic dysfunction of thoracic region: Secondary | ICD-10-CM | POA: Diagnosis not present

## 2017-07-14 DIAGNOSIS — M9901 Segmental and somatic dysfunction of cervical region: Secondary | ICD-10-CM | POA: Diagnosis not present

## 2017-07-14 DIAGNOSIS — M47812 Spondylosis without myelopathy or radiculopathy, cervical region: Secondary | ICD-10-CM | POA: Diagnosis not present

## 2017-07-16 DIAGNOSIS — M9901 Segmental and somatic dysfunction of cervical region: Secondary | ICD-10-CM | POA: Diagnosis not present

## 2017-07-16 DIAGNOSIS — M47812 Spondylosis without myelopathy or radiculopathy, cervical region: Secondary | ICD-10-CM | POA: Diagnosis not present

## 2017-07-16 DIAGNOSIS — M9902 Segmental and somatic dysfunction of thoracic region: Secondary | ICD-10-CM | POA: Diagnosis not present

## 2017-07-16 DIAGNOSIS — M9903 Segmental and somatic dysfunction of lumbar region: Secondary | ICD-10-CM | POA: Diagnosis not present

## 2017-07-17 DIAGNOSIS — L309 Dermatitis, unspecified: Secondary | ICD-10-CM | POA: Diagnosis not present

## 2017-07-21 DIAGNOSIS — M9902 Segmental and somatic dysfunction of thoracic region: Secondary | ICD-10-CM | POA: Diagnosis not present

## 2017-07-21 DIAGNOSIS — M9903 Segmental and somatic dysfunction of lumbar region: Secondary | ICD-10-CM | POA: Diagnosis not present

## 2017-07-21 DIAGNOSIS — M9901 Segmental and somatic dysfunction of cervical region: Secondary | ICD-10-CM | POA: Diagnosis not present

## 2017-07-21 DIAGNOSIS — M47812 Spondylosis without myelopathy or radiculopathy, cervical region: Secondary | ICD-10-CM | POA: Diagnosis not present

## 2017-07-23 DIAGNOSIS — M9902 Segmental and somatic dysfunction of thoracic region: Secondary | ICD-10-CM | POA: Diagnosis not present

## 2017-07-23 DIAGNOSIS — M9903 Segmental and somatic dysfunction of lumbar region: Secondary | ICD-10-CM | POA: Diagnosis not present

## 2017-07-23 DIAGNOSIS — M47812 Spondylosis without myelopathy or radiculopathy, cervical region: Secondary | ICD-10-CM | POA: Diagnosis not present

## 2017-07-23 DIAGNOSIS — M9901 Segmental and somatic dysfunction of cervical region: Secondary | ICD-10-CM | POA: Diagnosis not present

## 2017-07-28 DIAGNOSIS — M9902 Segmental and somatic dysfunction of thoracic region: Secondary | ICD-10-CM | POA: Diagnosis not present

## 2017-07-28 DIAGNOSIS — M47812 Spondylosis without myelopathy or radiculopathy, cervical region: Secondary | ICD-10-CM | POA: Diagnosis not present

## 2017-07-28 DIAGNOSIS — M9901 Segmental and somatic dysfunction of cervical region: Secondary | ICD-10-CM | POA: Diagnosis not present

## 2017-07-28 DIAGNOSIS — M9903 Segmental and somatic dysfunction of lumbar region: Secondary | ICD-10-CM | POA: Diagnosis not present

## 2017-07-30 DIAGNOSIS — M47812 Spondylosis without myelopathy or radiculopathy, cervical region: Secondary | ICD-10-CM | POA: Diagnosis not present

## 2017-07-30 DIAGNOSIS — M9902 Segmental and somatic dysfunction of thoracic region: Secondary | ICD-10-CM | POA: Diagnosis not present

## 2017-07-30 DIAGNOSIS — M9901 Segmental and somatic dysfunction of cervical region: Secondary | ICD-10-CM | POA: Diagnosis not present

## 2017-07-30 DIAGNOSIS — M9903 Segmental and somatic dysfunction of lumbar region: Secondary | ICD-10-CM | POA: Diagnosis not present

## 2017-08-04 DIAGNOSIS — Z1211 Encounter for screening for malignant neoplasm of colon: Secondary | ICD-10-CM | POA: Diagnosis not present

## 2017-08-04 DIAGNOSIS — K5900 Constipation, unspecified: Secondary | ICD-10-CM | POA: Diagnosis not present

## 2017-11-28 DIAGNOSIS — Z13220 Encounter for screening for lipoid disorders: Secondary | ICD-10-CM | POA: Diagnosis not present

## 2017-11-28 DIAGNOSIS — Z79899 Other long term (current) drug therapy: Secondary | ICD-10-CM | POA: Diagnosis not present

## 2017-11-28 DIAGNOSIS — Z0000 Encounter for general adult medical examination without abnormal findings: Secondary | ICD-10-CM | POA: Diagnosis not present

## 2018-01-08 DIAGNOSIS — Z780 Asymptomatic menopausal state: Secondary | ICD-10-CM | POA: Diagnosis not present

## 2018-01-08 DIAGNOSIS — Z01419 Encounter for gynecological examination (general) (routine) without abnormal findings: Secondary | ICD-10-CM | POA: Diagnosis not present

## 2018-01-08 DIAGNOSIS — Z130 Encounter for screening for diseases of the blood and blood-forming organs and certain disorders involving the immune mechanism: Secondary | ICD-10-CM | POA: Diagnosis not present

## 2018-01-08 DIAGNOSIS — Z1389 Encounter for screening for other disorder: Secondary | ICD-10-CM | POA: Diagnosis not present

## 2018-02-25 DIAGNOSIS — R8290 Unspecified abnormal findings in urine: Secondary | ICD-10-CM | POA: Diagnosis not present

## 2018-03-05 DIAGNOSIS — R8290 Unspecified abnormal findings in urine: Secondary | ICD-10-CM | POA: Diagnosis not present

## 2018-03-12 ENCOUNTER — Other Ambulatory Visit: Payer: Self-pay | Admitting: Physician Assistant

## 2018-03-12 ENCOUNTER — Ambulatory Visit
Admission: RE | Admit: 2018-03-12 | Discharge: 2018-03-12 | Disposition: A | Discharge status: Home / Self Care | Payer: BLUE CROSS/BLUE SHIELD | Source: Ambulatory Visit | Attending: Physician Assistant | Admitting: Physician Assistant

## 2018-03-12 DIAGNOSIS — S52552A Other extraarticular fracture of lower end of left radius, initial encounter for closed fracture: Secondary | ICD-10-CM | POA: Diagnosis not present

## 2018-03-12 DIAGNOSIS — W19XXXA Unspecified fall, initial encounter: Secondary | ICD-10-CM

## 2018-03-12 DIAGNOSIS — S52592A Other fractures of lower end of left radius, initial encounter for closed fracture: Secondary | ICD-10-CM | POA: Diagnosis not present

## 2018-03-26 DIAGNOSIS — S52552A Other extraarticular fracture of lower end of left radius, initial encounter for closed fracture: Secondary | ICD-10-CM | POA: Diagnosis not present

## 2018-03-26 DIAGNOSIS — N390 Urinary tract infection, site not specified: Secondary | ICD-10-CM | POA: Diagnosis not present

## 2018-04-07 DIAGNOSIS — R110 Nausea: Secondary | ICD-10-CM | POA: Diagnosis not present

## 2018-04-07 DIAGNOSIS — R634 Abnormal weight loss: Secondary | ICD-10-CM | POA: Diagnosis not present

## 2018-06-05 ENCOUNTER — Other Ambulatory Visit: Payer: Self-pay | Admitting: Family Medicine

## 2018-06-05 ENCOUNTER — Encounter: Payer: Self-pay | Admitting: Plastic Surgery

## 2018-06-05 ENCOUNTER — Ambulatory Visit: Payer: BLUE CROSS/BLUE SHIELD | Admitting: Plastic Surgery

## 2018-06-05 VITALS — BP 154/99 | HR 75 | Temp 98.6°F | Ht 66.75 in | Wt 149.5 lb

## 2018-06-05 DIAGNOSIS — N6489 Other specified disorders of breast: Secondary | ICD-10-CM

## 2018-06-05 DIAGNOSIS — Z1231 Encounter for screening mammogram for malignant neoplasm of breast: Secondary | ICD-10-CM

## 2018-06-05 DIAGNOSIS — Z853 Personal history of malignant neoplasm of breast: Secondary | ICD-10-CM | POA: Diagnosis not present

## 2018-06-05 NOTE — Progress Notes (Signed)
Patient ID: Amber Ross, female    DOB: Oct 06, 1955, 63 y.o.   MRN: [id number]   Chief Complaint  Patient presents with  . Advice Only    for breast asymmetry after cancer  . Breast Problem    The patient is a 63 year old female here for evaluation for breast reconstruction for asymmetry.  She underwent a partial mastectomy on the left 20 years ago.  She had postop radiation.  She has a 74 D on the right and a B cup on the left.  She is finding it difficult to pad her bra.  She is 5 feet 6-1/2 inches tall and weighs 150 pounds.  Her mammogram is due this month and she is going to try and get that upstairs.  She has significant loss of volume in the left upper pole and a mild amount in the right upper pole that is consistent with age.  She is finding it difficult to fit into bras without padding due to the significant size discrepancy.   Review of Systems  Constitutional: Negative.   HENT: Negative.   Eyes: Negative.   Respiratory: Negative.   Cardiovascular: Negative.   Gastrointestinal: Negative.   Endocrine: Negative.   Genitourinary: Negative.   Musculoskeletal: Positive for back pain and neck pain.  Skin: Negative.  Negative for color change and wound.  Neurological: Negative.   Hematological: Negative.   Psychiatric/Behavioral: Negative.     Past Medical History:  Diagnosis Date  . Breast cancer ([place])   . OSA (obstructive sleep apnea)    mild; not on CPAP  . OSA (obstructive sleep apnea)   . Personal history of radiation therapy     Past Surgical History:  Procedure Laterality Date  . BREAST BIOPSY    . BREAST LUMPECTOMY        Current Outpatient Medications:  .  venlafaxine XR (EFFEXOR XR) 150 MG 24 hr capsule, 1 capsule with food, Disp: , Rfl:  .  amphetamine-dextroamphetamine (ADDERALL) 10 MG tablet, Take 10 mg by mouth 2 (two) times daily with a meal., Disp: , Rfl:    Objective:   Vitals:   06/05/18 1312  BP: (!) 154/99  Pulse: 75  Temp: 98.6 F  (37 C)  SpO2: 100%    Physical Exam Vitals signs and nursing note reviewed.  Constitutional:      Appearance: Normal appearance.  HENT:     Head: Normocephalic and atraumatic.     Right Ear: External ear normal.     Left Ear: External ear normal.     Nose: Nose normal.     Mouth/Throat:     Mouth: Mucous membranes are moist.  Eyes:     Extraocular Movements: Extraocular movements intact.  Cardiovascular:     Rate and Rhythm: Normal rate and regular rhythm.  Pulmonary:     Effort: Pulmonary effort is normal.  Abdominal:     General: Abdomen is flat. There is no distension.     Tenderness: There is no abdominal tenderness.  Neurological:     General: No focal deficit present.     Mental Status: She is alert.  Psychiatric:        Mood and Affect: Mood normal.        Thought Content: Thought content normal.        Judgment: Judgment normal.     Assessment & Plan:  History of breast cancer in female  Postoperative breast asymmetry We discussed that options for achieving symmetry.  Due to the radiation the safest option is to do nothing on the left with a mastopexy reduction on the right.  The patient would like to do a reduction on the right and a list on the left breast for symmetry.   [place], DO

## 2018-06-24 ENCOUNTER — Ambulatory Visit
Admission: RE | Admit: 2018-06-24 | Discharge: 2018-06-24 | Disposition: A | Discharge status: Home / Self Care | Payer: BLUE CROSS/BLUE SHIELD | Source: Ambulatory Visit | Attending: Family Medicine | Admitting: Family Medicine

## 2018-06-24 DIAGNOSIS — Z1231 Encounter for screening mammogram for malignant neoplasm of breast: Secondary | ICD-10-CM | POA: Diagnosis not present

## 2018-07-15 ENCOUNTER — Encounter: Payer: BLUE CROSS/BLUE SHIELD | Admitting: Plastic Surgery

## 2018-07-30 ENCOUNTER — Encounter (HOSPITAL_BASED_OUTPATIENT_CLINIC_OR_DEPARTMENT_OTHER): Payer: Self-pay

## 2018-07-30 ENCOUNTER — Ambulatory Visit (HOSPITAL_BASED_OUTPATIENT_CLINIC_OR_DEPARTMENT_OTHER): Admit: 2018-07-30 | Payer: BLUE CROSS/BLUE SHIELD | Admitting: Plastic Surgery

## 2018-07-30 SURGERY — MAMMOPLASTY, REDUCTION
Anesthesia: General | Site: Breast | Laterality: Right

## 2018-08-07 ENCOUNTER — Encounter: Payer: BLUE CROSS/BLUE SHIELD | Admitting: Plastic Surgery

## 2018-09-10 DIAGNOSIS — L989 Disorder of the skin and subcutaneous tissue, unspecified: Secondary | ICD-10-CM | POA: Diagnosis not present

## 2018-09-15 ENCOUNTER — Other Ambulatory Visit: Payer: Self-pay | Admitting: Family Medicine

## 2018-09-15 DIAGNOSIS — C44722 Squamous cell carcinoma of skin of right lower limb, including hip: Secondary | ICD-10-CM | POA: Diagnosis not present

## 2018-09-15 DIAGNOSIS — L989 Disorder of the skin and subcutaneous tissue, unspecified: Secondary | ICD-10-CM | POA: Diagnosis not present

## 2018-09-18 ENCOUNTER — Telehealth: Payer: Self-pay | Admitting: Plastic Surgery

## 2018-09-18 NOTE — Telephone Encounter (Signed)

## 2018-09-21 ENCOUNTER — Ambulatory Visit (INDEPENDENT_AMBULATORY_CARE_PROVIDER_SITE_OTHER): Payer: BLUE CROSS/BLUE SHIELD | Admitting: Plastic Surgery

## 2018-09-21 ENCOUNTER — Other Ambulatory Visit: Payer: Self-pay

## 2018-09-21 ENCOUNTER — Encounter: Payer: Self-pay | Admitting: Plastic Surgery

## 2018-09-21 VITALS — BP 122/82 | HR 75 | Temp 98.1°F | Ht 67.5 in | Wt 147.0 lb

## 2018-09-21 DIAGNOSIS — N6489 Other specified disorders of breast: Secondary | ICD-10-CM

## 2018-09-21 MED ORDER — ONDANSETRON HCL 4 MG PO TABS: 4.0000 mg | ORAL_TABLET | Freq: Three times a day (TID) | ORAL | 0 refills | Status: DC | PRN

## 2018-09-21 MED ORDER — CEPHALEXIN 500 MG PO CAPS: 500.0000 mg | ORAL_CAPSULE | Freq: Four times a day (QID) | ORAL | 0 refills | Status: DC

## 2018-09-21 MED ORDER — HYDROCODONE-ACETAMINOPHEN 5-325 MG PO TABS: 1.0000 | ORAL_TABLET | Freq: Four times a day (QID) | ORAL | 0 refills | Status: DC | PRN

## 2018-09-21 NOTE — Progress Notes (Signed)
Patient ID: Amber Ross, female    DOB: [birth year], 63 y.o.   MRN: [id number]   Chief Complaint  Patient presents with  . Breast Problem    The patient is a 63 yrs old wf here for preoperative history and physical for breast reduction.  She had a left partial mastectomy 20 years ago and radiation.  She is now a 28 D on the right and a B on the left.  It is difficult to fit into a bra and clothes due to the asymmetry.  She had loss of volume on the upper pole of the left breast.She is 5 feet 6 inches tall and weighs 150 pounds.  Her last mammogram was this year and was negative.     Review of Systems  Constitutional: Negative.  Negative for activity change and appetite change.  HENT: Negative.   Eyes: Negative.   Respiratory: Negative for chest tightness and shortness of breath.   Cardiovascular: Negative.  Negative for leg swelling.  Gastrointestinal: Negative.  Negative for abdominal pain.  Endocrine: Negative.   Genitourinary: Negative.   Musculoskeletal: Negative.   Skin: Negative.   Neurological: Negative.   Hematological: Negative.   Psychiatric/Behavioral: Negative.     Past Medical History:  Diagnosis Date  . Breast cancer ([place])   . OSA (obstructive sleep apnea)    mild; not on CPAP  . OSA (obstructive sleep apnea)   . Personal history of radiation therapy     Past Surgical History:  Procedure Laterality Date  . BREAST BIOPSY    . BREAST LUMPECTOMY        Current Outpatient Medications:  .  amphetamine-dextroamphetamine (ADDERALL) 10 MG tablet, Take 2 tablets by mouth in the morning, and 1 tablet by mouth in the evening., Disp: , Rfl:  .  cephALEXin (KEFLEX) 500 MG capsule, Take 1 capsule (500 mg total) by mouth 4 (four) times daily for 5 days., Disp: 20 capsule, Rfl: 0 .  Cholecalciferol (VITAMIN D) 50 MCG (2000 UT) tablet, Take 2,000 Units by mouth daily., Disp: , Rfl:  .  HYDROcodone-acetaminophen (NORCO) 5-325 MG tablet, Take 1 tablet by mouth every 6  (six) hours as needed for up to 5 days for moderate pain., Disp: 20 tablet, Rfl: 0 .  ondansetron (ZOFRAN) 4 MG tablet, Take 1 tablet (4 mg total) by mouth every 8 (eight) hours as needed for up to 5 days., Disp: 15 tablet, Rfl: 0 .  venlafaxine XR (EFFEXOR XR) 150 MG 24 hr capsule, 1 capsule with food, Disp: , Rfl:    Objective:   Vitals:   09/21/18 0804  BP: 122/82  Pulse: 75  Temp: 98.1 F (36.7 C)  SpO2: 90%    Physical Exam Vitals signs and nursing note reviewed.  Constitutional:      Appearance: Normal appearance.  HENT:     Head: Normocephalic and atraumatic.  Cardiovascular:     Rate and Rhythm: Normal rate.  Pulmonary:     Effort: Pulmonary effort is normal. No respiratory distress.  Abdominal:     General: There is no distension.     Tenderness: There is no abdominal tenderness.  Skin:    General: Skin is warm.  Neurological:     General: No focal deficit present.     Mental Status: She is alert and oriented to person, place, and time.  Psychiatric:        Mood and Affect: Mood normal.        Behavior:  Behavior normal.     Assessment & Plan:  Postoperative breast asymmetry Plan for a right breast reduction and a left breast mastopexy.  The risk that can be encountered with breast reduction were discussed and include the following but not limited to these:  Breast asymmetry, fluid accumulation, firmness of the breast, inability to breast feed, loss of nipple or areola, skin loss, decrease or no nipple sensation, fat necrosis of the breast tissue, bleeding, infection, healing delay.  There are risks of anesthesia, changes to skin sensation and injury to nerves or blood vessels.  The muscle can be temporarily or permanently injured.  You may have an allergic reaction to tape, suture, glue, blood products which can result in skin discoloration, swelling, pain, skin lesions, poor healing.  Any of these can lead to the need for revisonal surgery or stage procedures.  A  reduction has potential to interfere with diagnostic procedures.  Nipple or breast piercing can increase risks of infection.  This procedure is best done when the breast is fully developed.  Changes in the breast will continue to occur over time.  Pregnancy can alter the outcomes of previous breast reduction surgery, weight gain and weigh loss can also effect the long term appearance.   Prescription sent to pharmacy.   [place], DO

## 2018-09-21 NOTE — H&P (View-Only) (Signed)
Patient ID: CADYN RODGER, female    DOB: 02-11-1956, 63 y.o.   MRN: [id number]   Chief Complaint  Patient presents with  . Breast Problem    The patient is a 63 yrs old wf here for preoperative history and physical for breast reduction.  She had a left partial mastectomy 20 years ago and radiation.  She is now a 32 D on the right and a B on the left.  It is difficult to fit into a bra and clothes due to the asymmetry.  She had loss of volume on the upper pole of the left breast.She is 64 feet 6 inches tall and weighs 150 pounds.  Her last mammogram was this year and was negative.     Review of Systems  Constitutional: Negative.  Negative for activity change and appetite change.  HENT: Negative.   Eyes: Negative.   Respiratory: Negative for chest tightness and shortness of breath.   Cardiovascular: Negative.  Negative for leg swelling.  Gastrointestinal: Negative.  Negative for abdominal pain.  Endocrine: Negative.   Genitourinary: Negative.   Musculoskeletal: Negative.   Skin: Negative.   Neurological: Negative.   Hematological: Negative.   Psychiatric/Behavioral: Negative.     Past Medical History:  Diagnosis Date  . Breast cancer ([place])   . OSA (obstructive sleep apnea)    mild; not on CPAP  . OSA (obstructive sleep apnea)   . Personal history of radiation therapy     Past Surgical History:  Procedure Laterality Date  . BREAST BIOPSY    . BREAST LUMPECTOMY        Current Outpatient Medications:  .  amphetamine-dextroamphetamine (ADDERALL) 10 MG tablet, Take 2 tablets by mouth in the morning, and 1 tablet by mouth in the evening., Disp: , Rfl:  .  cephALEXin (KEFLEX) 500 MG capsule, Take 1 capsule (500 mg total) by mouth 4 (four) times daily for 5 days., Disp: 20 capsule, Rfl: 0 .  Cholecalciferol (VITAMIN D) 50 MCG (2000 UT) tablet, Take 2,000 Units by mouth daily., Disp: , Rfl:  .  HYDROcodone-acetaminophen (NORCO) 5-325 MG tablet, Take 1 tablet by mouth every 6  (six) hours as needed for up to 5 days for moderate pain., Disp: 20 tablet, Rfl: 0 .  ondansetron (ZOFRAN) 4 MG tablet, Take 1 tablet (4 mg total) by mouth every 8 (eight) hours as needed for up to 5 days., Disp: 15 tablet, Rfl: 0 .  venlafaxine XR (EFFEXOR XR) 150 MG 24 hr capsule, 1 capsule with food, Disp: , Rfl:    Objective:   Vitals:   09/21/18 0804  BP: 122/82  Pulse: 75  Temp: 98.1 F (36.7 C)  SpO2: 90%    Physical Exam Vitals signs and nursing note reviewed.  Constitutional:      Appearance: Normal appearance.  HENT:     Head: Normocephalic and atraumatic.  Cardiovascular:     Rate and Rhythm: Normal rate.  Pulmonary:     Effort: Pulmonary effort is normal. No respiratory distress.  Abdominal:     General: There is no distension.     Tenderness: There is no abdominal tenderness.  Skin:    General: Skin is warm.  Neurological:     General: No focal deficit present.     Mental Status: She is alert and oriented to person, place, and time.  Psychiatric:        Mood and Affect: Mood normal.        Behavior:  Behavior normal.     Assessment & Plan:  Postoperative breast asymmetry Plan for a right breast reduction and a left breast mastopexy.  The risk that can be encountered with breast reduction were discussed and include the following but not limited to these:  Breast asymmetry, fluid accumulation, firmness of the breast, inability to breast feed, loss of nipple or areola, skin loss, decrease or no nipple sensation, fat necrosis of the breast tissue, bleeding, infection, healing delay.  There are risks of anesthesia, changes to skin sensation and injury to nerves or blood vessels.  The muscle can be temporarily or permanently injured.  You may have an allergic reaction to tape, suture, glue, blood products which can result in skin discoloration, swelling, pain, skin lesions, poor healing.  Any of these can lead to the need for revisonal surgery or stage procedures.  A  reduction has potential to interfere with diagnostic procedures.  Nipple or breast piercing can increase risks of infection.  This procedure is best done when the breast is fully developed.  Changes in the breast will continue to occur over time.  Pregnancy can alter the outcomes of previous breast reduction surgery, weight gain and weigh loss can also effect the long term appearance.   Prescription sent to pharmacy.   [place], DO

## 2018-09-22 ENCOUNTER — Encounter: Payer: BLUE CROSS/BLUE SHIELD | Admitting: Plastic Surgery

## 2018-09-23 ENCOUNTER — Encounter (HOSPITAL_BASED_OUTPATIENT_CLINIC_OR_DEPARTMENT_OTHER): Payer: Self-pay | Admitting: *Deleted

## 2018-09-23 ENCOUNTER — Other Ambulatory Visit: Payer: Self-pay

## 2018-09-24 ENCOUNTER — Other Ambulatory Visit (HOSPITAL_COMMUNITY)
Admission: RE | Admit: 2018-09-24 | Discharge: 2018-09-24 | Disposition: A | Discharge status: Home / Self Care | Payer: BC Managed Care – PPO | Source: Ambulatory Visit | Attending: Plastic Surgery | Admitting: Plastic Surgery

## 2018-09-24 DIAGNOSIS — Z1159 Encounter for screening for other viral diseases: Secondary | ICD-10-CM | POA: Diagnosis not present

## 2018-09-25 LAB — NOVEL CORONAVIRUS, NAA (HOSP ORDER, SEND-OUT TO REF LAB; TAT 18-24 HRS): SARS-CoV-2, NAA: NOT DETECTED

## 2018-09-28 ENCOUNTER — Ambulatory Visit (HOSPITAL_BASED_OUTPATIENT_CLINIC_OR_DEPARTMENT_OTHER): Payer: BC Managed Care – PPO | Admitting: Anesthesiology

## 2018-09-28 ENCOUNTER — Encounter (HOSPITAL_BASED_OUTPATIENT_CLINIC_OR_DEPARTMENT_OTHER)
Admission: RE | Disposition: A | Discharge status: Home / Self Care | Payer: Self-pay | Source: Home / Self Care | Attending: Plastic Surgery

## 2018-09-28 ENCOUNTER — Ambulatory Visit (HOSPITAL_BASED_OUTPATIENT_CLINIC_OR_DEPARTMENT_OTHER)
Admission: RE | Admit: 2018-09-28 | Discharge: 2018-09-28 | Disposition: A | Discharge status: Home / Self Care | Payer: BC Managed Care – PPO | Attending: Plastic Surgery | Admitting: Plastic Surgery

## 2018-09-28 ENCOUNTER — Other Ambulatory Visit: Payer: Self-pay

## 2018-09-28 ENCOUNTER — Encounter (HOSPITAL_BASED_OUTPATIENT_CLINIC_OR_DEPARTMENT_OTHER): Payer: Self-pay | Admitting: Anesthesiology

## 2018-09-28 DIAGNOSIS — Z923 Personal history of irradiation: Secondary | ICD-10-CM | POA: Insufficient documentation

## 2018-09-28 DIAGNOSIS — Z853 Personal history of malignant neoplasm of breast: Secondary | ICD-10-CM | POA: Diagnosis not present

## 2018-09-28 DIAGNOSIS — N6489 Other specified disorders of breast: Secondary | ICD-10-CM | POA: Diagnosis not present

## 2018-09-28 DIAGNOSIS — Z79899 Other long term (current) drug therapy: Secondary | ICD-10-CM | POA: Insufficient documentation

## 2018-09-28 DIAGNOSIS — G4733 Obstructive sleep apnea (adult) (pediatric): Secondary | ICD-10-CM | POA: Insufficient documentation

## 2018-09-28 DIAGNOSIS — N651 Disproportion of reconstructed breast: Secondary | ICD-10-CM | POA: Diagnosis not present

## 2018-09-28 DIAGNOSIS — Z87891 Personal history of nicotine dependence: Secondary | ICD-10-CM | POA: Insufficient documentation

## 2018-09-28 DIAGNOSIS — N6011 Diffuse cystic mastopathy of right breast: Secondary | ICD-10-CM | POA: Diagnosis not present

## 2018-09-28 DIAGNOSIS — F419 Anxiety disorder, unspecified: Secondary | ICD-10-CM | POA: Diagnosis not present

## 2018-09-28 HISTORY — PX: BREAST REDUCTION SURGERY: SHX8

## 2018-09-28 HISTORY — PX: MASTOPEXY: SHX5358

## 2018-09-28 HISTORY — DX: Attention-deficit hyperactivity disorder, unspecified type: F90.9

## 2018-09-28 SURGERY — MAMMOPLASTY, REDUCTION
Anesthesia: General | Site: Breast | Laterality: Right

## 2018-09-28 MED ORDER — MIDAZOLAM HCL 2 MG/2ML IJ SOLN
1.0000 mg | INTRAMUSCULAR | Status: DC | PRN
  Administered 2018-09-28: 2 mg via INTRAVENOUS

## 2018-09-28 MED ORDER — HYDROMORPHONE HCL 1 MG/ML IJ SOLN: 0.2500 mg | INTRAMUSCULAR | Status: DC | PRN

## 2018-09-28 MED ORDER — LIDOCAINE-EPINEPHRINE 1 %-1:100000 IJ SOLN
INTRAMUSCULAR | Status: DC | PRN
  Administered 2018-09-28: 12 mL

## 2018-09-28 MED ORDER — LIDOCAINE-EPINEPHRINE (PF) 1 %-1:200000 IJ SOLN
INTRAMUSCULAR | Status: AC
  Filled 2018-09-28: qty 90

## 2018-09-28 MED ORDER — PHENYLEPHRINE 40 MCG/ML (10ML) SYRINGE FOR IV PUSH (FOR BLOOD PRESSURE SUPPORT)
PREFILLED_SYRINGE | INTRAVENOUS | Status: AC
  Filled 2018-09-28: qty 10

## 2018-09-28 MED ORDER — BUPIVACAINE-EPINEPHRINE 0.25% -1:200000 IJ SOLN
INTRAMUSCULAR | Status: DC | PRN
  Administered 2018-09-28: 12 mL

## 2018-09-28 MED ORDER — ACETAMINOPHEN 650 MG RE SUPP: 650.0000 mg | RECTAL | Status: DC | PRN

## 2018-09-28 MED ORDER — LABETALOL HCL 5 MG/ML IV SOLN
INTRAVENOUS | Status: AC
  Filled 2018-09-28: qty 4

## 2018-09-28 MED ORDER — EPHEDRINE SULFATE-NACL 50-0.9 MG/10ML-% IV SOSY
PREFILLED_SYRINGE | INTRAVENOUS | Status: DC | PRN
  Administered 2018-09-28: 5 mg via INTRAVENOUS
  Administered 2018-09-28: 10 mg via INTRAVENOUS
  Administered 2018-09-28: 15 mg via INTRAVENOUS
  Administered 2018-09-28 (×2): 10 mg via INTRAVENOUS

## 2018-09-28 MED ORDER — SODIUM CHLORIDE 0.9 % IV SOLN
INTRAVENOUS | Status: AC
  Filled 2018-09-28: qty 500000

## 2018-09-28 MED ORDER — LACTATED RINGERS IV SOLN
INTRAVENOUS | Status: DC
  Administered 2018-09-28 (×2): via INTRAVENOUS

## 2018-09-28 MED ORDER — ACETAMINOPHEN 325 MG PO TABS: 650.0000 mg | ORAL_TABLET | ORAL | Status: DC | PRN

## 2018-09-28 MED ORDER — MEPERIDINE HCL 25 MG/ML IJ SOLN: 6.2500 mg | INTRAMUSCULAR | Status: DC | PRN

## 2018-09-28 MED ORDER — DEXAMETHASONE SODIUM PHOSPHATE 4 MG/ML IJ SOLN
INTRAMUSCULAR | Status: DC | PRN
  Administered 2018-09-28: 10 mg via INTRAVENOUS

## 2018-09-28 MED ORDER — FENTANYL CITRATE (PF) 100 MCG/2ML IJ SOLN
INTRAMUSCULAR | Status: AC
  Filled 2018-09-28: qty 2

## 2018-09-28 MED ORDER — ROCURONIUM BROMIDE 10 MG/ML (PF) SYRINGE
PREFILLED_SYRINGE | INTRAVENOUS | Status: AC
  Filled 2018-09-28: qty 10

## 2018-09-28 MED ORDER — EPHEDRINE 5 MG/ML INJ
INTRAVENOUS | Status: AC
  Filled 2018-09-28: qty 10

## 2018-09-28 MED ORDER — OXYCODONE HCL 5 MG/5ML PO SOLN: 5.0000 mg | Freq: Once | ORAL | Status: AC | PRN

## 2018-09-28 MED ORDER — BUPIVACAINE-EPINEPHRINE (PF) 0.25% -1:200000 IJ SOLN
INTRAMUSCULAR | Status: AC
  Filled 2018-09-28: qty 90

## 2018-09-28 MED ORDER — OXYCODONE HCL 5 MG PO TABS
ORAL_TABLET | ORAL | Status: AC
  Filled 2018-09-28: qty 1

## 2018-09-28 MED ORDER — ONDANSETRON HCL 4 MG/2ML IJ SOLN
INTRAMUSCULAR | Status: AC
  Filled 2018-09-28: qty 2

## 2018-09-28 MED ORDER — FENTANYL CITRATE (PF) 100 MCG/2ML IJ SOLN
50.0000 ug | INTRAMUSCULAR | Status: DC | PRN
  Administered 2018-09-28 (×2): 50 ug via INTRAVENOUS

## 2018-09-28 MED ORDER — MIDAZOLAM HCL 2 MG/2ML IJ SOLN
INTRAMUSCULAR | Status: AC
  Filled 2018-09-28: qty 2

## 2018-09-28 MED ORDER — CEFAZOLIN SODIUM-DEXTROSE 2-4 GM/100ML-% IV SOLN
2.0000 g | INTRAVENOUS | Status: AC
  Administered 2018-09-28: 2 mg via INTRAVENOUS

## 2018-09-28 MED ORDER — PROMETHAZINE HCL 25 MG/ML IJ SOLN: 6.2500 mg | INTRAMUSCULAR | Status: DC | PRN

## 2018-09-28 MED ORDER — CEFAZOLIN SODIUM-DEXTROSE 2-4 GM/100ML-% IV SOLN
INTRAVENOUS | Status: AC
  Filled 2018-09-28: qty 100

## 2018-09-28 MED ORDER — DEXAMETHASONE SODIUM PHOSPHATE 10 MG/ML IJ SOLN
INTRAMUSCULAR | Status: AC
  Filled 2018-09-28: qty 1

## 2018-09-28 MED ORDER — OXYCODONE HCL 5 MG PO TABS: 5.0000 mg | ORAL_TABLET | ORAL | Status: DC | PRN

## 2018-09-28 MED ORDER — ONDANSETRON HCL 4 MG/2ML IJ SOLN
INTRAMUSCULAR | Status: DC | PRN
  Administered 2018-09-28: 4 mg via INTRAVENOUS

## 2018-09-28 MED ORDER — SODIUM CHLORIDE 0.9% FLUSH: 3.0000 mL | INTRAVENOUS | Status: DC | PRN

## 2018-09-28 MED ORDER — LIDOCAINE 2% (20 MG/ML) 5 ML SYRINGE
INTRAMUSCULAR | Status: AC
  Filled 2018-09-28: qty 5

## 2018-09-28 MED ORDER — SODIUM CHLORIDE 0.9% FLUSH: 3.0000 mL | Freq: Two times a day (BID) | INTRAVENOUS | Status: DC

## 2018-09-28 MED ORDER — OXYCODONE HCL 5 MG PO TABS
5.0000 mg | ORAL_TABLET | Freq: Once | ORAL | Status: AC | PRN
  Administered 2018-09-28: 5 mg via ORAL

## 2018-09-28 MED ORDER — LIDOCAINE-EPINEPHRINE 1 %-1:100000 IJ SOLN
INTRAMUSCULAR | Status: AC
  Filled 2018-09-28: qty 1

## 2018-09-28 MED ORDER — SCOPOLAMINE 1 MG/3DAYS TD PT72: 1.0000 | MEDICATED_PATCH | Freq: Once | TRANSDERMAL | Status: DC | PRN

## 2018-09-28 MED ORDER — SODIUM CHLORIDE 0.9 % IV SOLN: 250.0000 mL | INTRAVENOUS | Status: DC | PRN

## 2018-09-28 MED ORDER — PHENYLEPHRINE 40 MCG/ML (10ML) SYRINGE FOR IV PUSH (FOR BLOOD PRESSURE SUPPORT)
PREFILLED_SYRINGE | INTRAVENOUS | Status: DC | PRN
  Administered 2018-09-28 (×2): 80 ug via INTRAVENOUS
  Administered 2018-09-28: 120 ug via INTRAVENOUS

## 2018-09-28 MED ORDER — LIDOCAINE 2% (20 MG/ML) 5 ML SYRINGE
INTRAMUSCULAR | Status: DC | PRN
  Administered 2018-09-28: 80 mg via INTRAVENOUS

## 2018-09-28 MED ORDER — BUPIVACAINE-EPINEPHRINE 0.25% -1:200000 IJ SOLN
INTRAMUSCULAR | Status: AC
  Filled 2018-09-28: qty 1

## 2018-09-28 MED ORDER — ROCURONIUM BROMIDE 50 MG/5ML IV SOSY
PREFILLED_SYRINGE | INTRAVENOUS | Status: DC | PRN
  Administered 2018-09-28: 50 mg via INTRAVENOUS
  Administered 2018-09-28: 10 mg via INTRAVENOUS

## 2018-09-28 MED ORDER — KETOROLAC TROMETHAMINE 30 MG/ML IJ SOLN
INTRAMUSCULAR | Status: AC
  Filled 2018-09-28: qty 1

## 2018-09-28 MED ORDER — SUGAMMADEX SODIUM 200 MG/2ML IV SOLN
INTRAVENOUS | Status: DC | PRN
  Administered 2018-09-28: 200 mg via INTRAVENOUS

## 2018-09-28 MED ORDER — PROPOFOL 10 MG/ML IV BOLUS
INTRAVENOUS | Status: DC | PRN
  Administered 2018-09-28: 50 mg via INTRAVENOUS
  Administered 2018-09-28: 150 mg via INTRAVENOUS

## 2018-09-28 MED ORDER — SODIUM CHLORIDE 0.9 % IV SOLN
INTRAVENOUS | Status: DC | PRN
  Administered 2018-09-28: 500 mL

## 2018-09-28 SURGICAL SUPPLY — 80 items
ADH SKN CLS APL DERMABOND .7 (GAUZE/BANDAGES/DRESSINGS) ×6
APL PRP STRL LF DISP 70% ISPRP (MISCELLANEOUS) ×2
BAG DECANTER FOR FLEXI CONT (MISCELLANEOUS) ×4 IMPLANT
BINDER BREAST LRG (GAUZE/BANDAGES/DRESSINGS) IMPLANT
BINDER BREAST MEDIUM (GAUZE/BANDAGES/DRESSINGS) IMPLANT
BINDER BREAST XLRG (GAUZE/BANDAGES/DRESSINGS) ×2 IMPLANT
BINDER BREAST XXLRG (GAUZE/BANDAGES/DRESSINGS) IMPLANT
BIOPATCH RED 1 DISK 7.0 (GAUZE/BANDAGES/DRESSINGS) IMPLANT
BIOPATCH RED 1IN DISK 7.0MM (GAUZE/BANDAGES/DRESSINGS)
BLADE HEX COATED 2.75 (ELECTRODE) ×4 IMPLANT
BLADE KNIFE PERSONA 10 (BLADE) ×4 IMPLANT
BLADE SURG 10 STRL SS (BLADE) ×6 IMPLANT
BLADE SURG 15 STRL LF DISP TIS (BLADE) ×2 IMPLANT
BLADE SURG 15 STRL SS (BLADE) ×4
BNDG GAUZE ELAST 4 BULKY (GAUZE/BANDAGES/DRESSINGS) ×8 IMPLANT
CANISTER SUCT 1200ML W/VALVE (MISCELLANEOUS) ×4 IMPLANT
CHLORAPREP W/TINT 26 (MISCELLANEOUS) ×4 IMPLANT
CLOSURE WOUND 1/2 X4 (GAUZE/BANDAGES/DRESSINGS) ×1
COVER BACK TABLE REUSABLE LG (DRAPES) ×4 IMPLANT
COVER MAYO STAND REUSABLE (DRAPES) ×4 IMPLANT
COVER WAND RF STERILE (DRAPES) IMPLANT
DECANTER SPIKE VIAL GLASS SM (MISCELLANEOUS) IMPLANT
DERMABOND ADVANCED (GAUZE/BANDAGES/DRESSINGS) ×6
DERMABOND ADVANCED .7 DNX12 (GAUZE/BANDAGES/DRESSINGS) IMPLANT
DRAIN CHANNEL 19F RND (DRAIN) IMPLANT
DRAPE LAPAROSCOPIC ABDOMINAL (DRAPES) ×4 IMPLANT
DRSG PAD ABDOMINAL 8X10 ST (GAUZE/BANDAGES/DRESSINGS) ×8 IMPLANT
DRSG TEGADERM 2-3/8X2-3/4 SM (GAUZE/BANDAGES/DRESSINGS) IMPLANT
ELECT BLADE 4.0 EZ CLEAN MEGAD (MISCELLANEOUS)
ELECT REM PT RETURN 9FT ADLT (ELECTROSURGICAL) ×4
ELECTRODE BLDE 4.0 EZ CLN MEGD (MISCELLANEOUS) IMPLANT
ELECTRODE REM PT RTRN 9FT ADLT (ELECTROSURGICAL) ×2 IMPLANT
EVACUATOR SILICONE 100CC (DRAIN) IMPLANT
GAUZE SPONGE 4X4 12PLY STRL (GAUZE/BANDAGES/DRESSINGS) IMPLANT
GAUZE SPONGE 4X4 12PLY STRL LF (GAUZE/BANDAGES/DRESSINGS) ×4 IMPLANT
GLOVE BIO SURGEON STRL SZ 6.5 (GLOVE) ×9 IMPLANT
GLOVE BIO SURGEONS STRL SZ 6.5 (GLOVE) ×3
GLOVE BIOGEL M STRL SZ7.5 (GLOVE) ×2 IMPLANT
GLOVE BIOGEL PI IND STRL 8 (GLOVE) IMPLANT
GLOVE BIOGEL PI INDICATOR 8 (GLOVE) ×2
GOWN STRL REUS W/ TWL LRG LVL3 (GOWN DISPOSABLE) ×6 IMPLANT
GOWN STRL REUS W/ TWL XL LVL3 (GOWN DISPOSABLE) IMPLANT
GOWN STRL REUS W/TWL LRG LVL3 (GOWN DISPOSABLE) ×12
GOWN STRL REUS W/TWL XL LVL3 (GOWN DISPOSABLE) ×4
NDL HYPO 25X1 1.5 SAFETY (NEEDLE) ×2 IMPLANT
NDL SAFETY ECLIPSE 18X1.5 (NEEDLE) IMPLANT
NEEDLE HYPO 18GX1.5 SHARP (NEEDLE)
NEEDLE HYPO 25X1 1.5 SAFETY (NEEDLE) ×4 IMPLANT
NS IRRIG 1000ML POUR BTL (IV SOLUTION) ×4 IMPLANT
PACK BASIN DAY SURGERY FS (CUSTOM PROCEDURE TRAY) ×4 IMPLANT
PAD ALCOHOL SWAB (MISCELLANEOUS) IMPLANT
PENCIL BUTTON HOLSTER BLD 10FT (ELECTRODE) ×4 IMPLANT
PIN SAFETY STERILE (MISCELLANEOUS) IMPLANT
SLEEVE SCD COMPRESS KNEE MED (MISCELLANEOUS) ×4 IMPLANT
SPONGE LAP 18X18 RF (DISPOSABLE) ×10 IMPLANT
STRIP CLOSURE SKIN 1/2X4 (GAUZE/BANDAGES/DRESSINGS) ×3 IMPLANT
STRIP SUTURE WOUND CLOSURE 1/2 (SUTURE) ×8 IMPLANT
SUT MNCRL AB 4-0 PS2 18 (SUTURE) ×10 IMPLANT
SUT MON AB 3-0 SH 27 (SUTURE) ×4
SUT MON AB 3-0 SH27 (SUTURE) ×2 IMPLANT
SUT MON AB 5-0 PS2 18 (SUTURE) ×8 IMPLANT
SUT PDS 3-0 CT2 (SUTURE)
SUT PDS AB 2-0 CT2 27 (SUTURE) IMPLANT
SUT PDS II 3-0 CT2 27 ABS (SUTURE) IMPLANT
SUT SILK 3 0 PS 1 (SUTURE) IMPLANT
SUT VIC AB 3-0 SH 27 (SUTURE)
SUT VIC AB 3-0 SH 27X BRD (SUTURE) IMPLANT
SUT VICRYL 4-0 PS2 18IN ABS (SUTURE) IMPLANT
SYR 3ML 23GX1 SAFETY (SYRINGE) ×2 IMPLANT
SYR 50ML LL SCALE MARK (SYRINGE) IMPLANT
SYR BULB IRRIGATION 50ML (SYRINGE) ×4 IMPLANT
SYR CONTROL 10ML LL (SYRINGE) ×4 IMPLANT
TAPE MEASURE VINYL STERILE (MISCELLANEOUS) ×4 IMPLANT
TOWEL GREEN STERILE FF (TOWEL DISPOSABLE) ×8 IMPLANT
TUBE CONNECTING 20'X1/4 (TUBING) ×1
TUBE CONNECTING 20X1/4 (TUBING) ×3 IMPLANT
TUBING INFILTRATION IT-10001 (TUBING) IMPLANT
TUBING SET GRADUATE ASPIR 12FT (MISCELLANEOUS) IMPLANT
UNDERPAD 30X30 (UNDERPADS AND DIAPERS) ×8 IMPLANT
YANKAUER SUCT BULB TIP NO VENT (SUCTIONS) ×4 IMPLANT

## 2018-09-28 NOTE — Discharge Instructions (Addendum)
5mg  oxycodone given at 3:00pm  INSTRUCTIONS FOR AFTER BREAST SURGERY   You are getting ready to undergo breast surgery.  You will likely have some questions about what to expect following your operation.  The following information will help you and your family understand what to expect when you are discharged from the hospital.  Following these guidelines will help ensure a smooth recovery and reduce risks of complications.   Postoperative instructions include information on: diet, wound care, medications and physical activity.  AFTER SURGERY Expect to go home after the procedure.  In some cases, you may need to spend one night in the hospital for observation.  DIET Breast surgery does not require a specific diet.  However, I have to mention that the healthier you eat the better your body can start healing. It is important to increasing your protein intake.  This means limiting the foods with sugar and carbohydrates.  Focus on vegetables and some meat.  If you have any liposuction during your procedure be sure to drink water.  If your urine is bright yellow, then it is concentrated, and you need to drink more water.  As a general rule after surgery, you should have 8 ounces of water every hour while awake.  If you find you are persistently nauseated or unable to take in liquids let us know.  NO TOBACCO USE or EXPOSURE.  This will slow your healing process and increase the risk of a wound.  WOUND CARE You can shower the day after surgery if you don't have a drain.  Use fragrance free soap.  Dial, [place] and [place] are usually mild on the skin.  If you have steri-strips / tape directly attached to your skin leave them in place. It is OK to get these wet.  No baths, pools or hot tubs for two weeks. We close your incision to leave the smallest and best-looking scar. No ointment or creams on your incisions until given the go ahead.  Especially not Neosporin (Too many skin reactions with this one).  A few  weeks after surgery you can use Mederma and start massaging the scar. We ask you to wear your binder or sports bra for the first 6 weeks around the clock, including while sleeping. This provides added comfort and helps reduce the fluid accumulation at the surgery site.  ACTIVITY No heavy lifting until cleared by the doctor.  This usually means no more than a half-gallon of milk.  It is OK to walk and climb stairs. In fact, moving your legs is very important to decrease your risk of a blood clot.  It will also help keep you from getting deconditioned.  Every 1 to 2 hours get up and walk for 5 minutes. This will help with a quicker recovery back to normal.  Let pain be your guide so you don't do too much.  NO, you cannot do the spring cleaning and don't plan on taking care of anyone else.  This is your time for TLC.  You will be more comfortable if you sleep and rest with your head elevated either with a few pillows under you or in a recliner.  No stomach sleeping for a few months.  WORK Everyone returns to work at different times. As a rough guide, most people take at least 1 - 2 weeks off prior to returning to work. If you need documentation for your job, bring the forms to your postoperative follow up visit.  DRIVING Arrange for someone to  bring you home from the hospital.  You may be able to drive a few days after surgery but not while taking any narcotics or valium.  BOWEL MOVEMENTS Constipation can occur after anesthesia and while taking pain medication.  It is important to stay ahead for your comfort.  We recommend taking Milk of Magnesia (2 tablespoons; twice a day) while taking the pain pills.  SEROMA This is fluid your body tried to put in the surgical site.  This is normal but if it creates tight skinny skin let us know.  It usually decreases in a few weeks.  WHEN TO CALL Call your surgeon's office if any of the following occur:  Fever 101 degrees F or greater  Excessive bleeding or  fluid from the incision site.  Pain that increases over time without aid from the medications  Redness, warmth, or pus draining from incision sites  Persistent nausea or inability to take in liquids  Severe misshapen area that underwent the operation.  Here are some resources:  1. Plastic surgery website: https://www.plasticsurgery.org/for-medical-professionals/education-and-resources/publications/breast-reconstruction-magazine 2. Breast Reconstruction Awareness Campaign:  HotelLives.co.nz 3. Plastic surgery Implant information:  https://www.plasticsurgery.org/patient-safety/breast-implant-safety    Post Anesthesia Home Care Instructions  Activity: Get plenty of rest for the remainder of the day. A responsible individual must stay with you for 24 hours following the procedure.  For the next 24 hours, DO NOT: -Drive a car -Paediatric nurse -Drink alcoholic beverages -Take any medication unless instructed by your physician -Make any legal decisions or sign important papers.  Meals: Start with liquid foods such as gelatin or soup. Progress to regular foods as tolerated. Avoid greasy, spicy, heavy foods. If nausea and/or vomiting occur, drink only clear liquids until the nausea and/or vomiting subsides. Call your physician if vomiting continues.  Special Instructions/Symptoms: Your throat may feel dry or sore from the anesthesia or the breathing tube placed in your throat during surgery. If this causes discomfort, gargle with warm salt water. The discomfort should disappear within 24 hours.  If you had a scopolamine patch placed behind your ear for the management of post- operative nausea and/or vomiting:  1. The medication in the patch is effective for 72 hours, after which it should be removed.  Wrap patch in a tissue and discard in the trash. Wash hands thoroughly with soap and water. 2. You may remove the patch earlier than 72 hours if you experience unpleasant  side effects which may include dry mouth, dizziness or visual disturbances. 3. Avoid touching the patch. Wash your hands with soap and water after contact with the patch.

## 2018-09-28 NOTE — Interval H&P Note (Signed)
History and Physical Interval Note:  09/28/2018 12:01 PM  Amber Ross  has presented today for surgery, with the diagnosis of History Of Breast Cancer In Female, Postoperative breast asymmetry.  The various methods of treatment have been discussed with the patient and family. After consideration of risks, benefits and other options for treatment, the patient has consented to  Procedure(s) with comments: MAMMARY REDUCTION  (BREAST) (Right) MASTOPEXY (Left) - Total case time is 3 hours as a surgical intervention.  The patient's history has been reviewed, patient examined, no change in status, stable for surgery.  I have reviewed the patient's chart and labs.  Questions were answered to the patient's satisfaction.     Loel Lofty Lennie Vasco

## 2018-09-28 NOTE — Anesthesia Preprocedure Evaluation (Signed)
Anesthesia Evaluation    Airway Mallampati: II  TM Distance: >3 FB Neck ROM: Full    Dental no notable dental hx.    Pulmonary sleep apnea , former smoker,    Pulmonary exam normal breath sounds clear to auscultation       Cardiovascular Normal cardiovascular exam Rhythm:Regular Rate:Normal     Neuro/Psych Anxiety    GI/Hepatic   Endo/Other    Renal/GU      Musculoskeletal   Abdominal   Peds  Hematology   Anesthesia Other Findings   Reproductive/Obstetrics                             Anesthesia Physical Anesthesia Plan  ASA: II  Anesthesia Plan: General   Post-op Pain Management:    Induction: Intravenous  PONV Risk Score and Plan: 3 and Ondansetron, Dexamethasone and Midazolam  Airway Management Planned: Oral ETT and LMA  Additional Equipment:   Intra-op Plan:   Post-operative Plan: Extubation in OR  Informed Consent: I have reviewed the patients History and Physical, chart, labs and discussed the procedure including the risks, benefits and alternatives for the proposed anesthesia with the patient or authorized representative who has indicated his/her understanding and acceptance.     Dental advisory given  Plan Discussed with: CRNA  Anesthesia Plan Comments:         Anesthesia Quick Evaluation

## 2018-09-28 NOTE — Op Note (Signed)
Breast Reduction Op note:    DATE OF PROCEDURE: 09/28/2018  LOCATION: [place]  SURGEON: Lyndee Leo Sanger Norva Bowe, DO  PREOPERATIVE DIAGNOSIS 1. Breast asymmetry due to breast cancer treatment of the left breast 2. History of radiated left breast  POSTOPERATIVE DIAGNOSIS 1. Breast asymmetry due to breast cancer treatment of the left breast 2. History of radiated left breast  PROCEDURES 1. Right breast reduction.  Right reduction 210g 2. Left breast mastopexy  COMPLICATIONS: None.  DRAINS: none  INDICATIONS FOR PROCEDURE Amber Ross is a 63 y.o. year-old female born on 1955-08-08,with a history of breast asymmetry due to treatment of a left breast cancer and radiation.   MRN: [id number]  CONSENT Informed consent was obtained directly from the patient. The risks, benefits and alternatives were fully discussed. Specific risks including but not limited to bleeding, infection, hematoma, seroma, scarring, pain, nipple necrosis, asymmetry, poor cosmetic results, and need for further surgery were discussed. The patient had ample opportunity to have her questions answered to her satisfaction.  DESCRIPTION OF PROCEDURE  Patient was brought into the operating room and placed in a supine position.  SCDs were placed and appropriate padding was performed.  Antibiotics were given. The patient underwent general anesthesia and the chest was prepped and draped in a sterile fashion.  A timeout was performed and all information was confirmed to be correct.  Right side: Preoperative markings were confirmed.  Incision lines were injected with 1% Xylocaine with epinephrine.  After waiting for vasoconstriction, the marked lines were incised.  A Wise-pattern superomedial breast reduction was performed by de-epithelializing the pedicle, using bovie to create the superomedial pedicle, and removing breast tissue from the superior, lateral, and inferior portions of the breast.  Care  was taken to not undermine the breast pedicle. Hemostasis was achieved.  The nipple was gently rotated into position and the soft tissue closed with 4-0 Monocryl.   The pocket was irrigated and hemostasis confirmed.  The deep tissues were approximated with 3-0 monocryl sutures and the skin was closed with deep dermal and subcuticular 4-0 Monocryl sutures.  The nipple and skin flaps had good capillary refill at the end of the procedure.    Left side: Preoperative markings were confirmed.  Incision lines were injected with 1% Xylocaine with epinephrine.  After waiting for vasoconstriction, the marked lines were incised.  A semicircular incision was made at the left breast and all around the nipple areola.  The semicircular area was de-epithelialized.   The bovie was used to obtain hemostasis.  Care was taken to not undermine the areaola.  The nipple was gently raised into position.  The tissue was closed with 4-0 Monocryl.  The vertical limb was then de-epithelialized and brought together.  The was closed with the 4-0 and then 5-0 Monocryl. The patient was sat upright and size and shape symmetry was confirmed.  The deep tissues were approximated with 4-0 monocryl sutures and the skin was closed with deep dermal and subcuticular 5-0 Monocryl sutures.  Dermabond was applied.  A breast binder and ABDs were placed.  The nipple and skin flaps had good capillary refill at the end of the procedure.  The patient tolerated the procedure well. The patient was allowed to wake from anesthesia and taken to the recovery room in satisfactory condition

## 2018-09-28 NOTE — Transfer of Care (Signed)
Immediate Anesthesia Transfer of Care Note  Patient: Amber Ross  Procedure(s) Performed: MAMMARY REDUCTION  (BREAST) (Right Breast) MASTOPEXY (Left Breast)  Patient Location: PACU  Anesthesia Type:General  Level of Consciousness: awake and alert   Airway & Oxygen Therapy: Patient Spontanous Breathing and Patient connected to nasal cannula oxygen  Post-op Assessment: Report given to RN and Post -op Vital signs reviewed and stable  Post vital signs: Reviewed and stable  Last Vitals:  Vitals Value Taken Time  BP 133/63 09/28/2018  2:10 PM  Temp    Pulse 39 09/28/2018  2:13 PM  Resp 20 09/28/2018  2:13 PM  SpO2 85 % 09/28/2018  2:13 PM  Vitals shown include unvalidated device data.  Last Pain:  Vitals:   09/28/18 1031  TempSrc: Oral  PainSc: 0-No pain         Complications: No apparent anesthesia complications

## 2018-09-28 NOTE — Anesthesia Postprocedure Evaluation (Signed)
Anesthesia Post Note  Patient: Amber Ross  Procedure(s) Performed: MAMMARY REDUCTION  (BREAST) (Right Breast) MASTOPEXY (Left Breast)     Patient location during evaluation: PACU Anesthesia Type: General Level of consciousness: awake and alert Pain management: pain level controlled Vital Signs Assessment: post-procedure vital signs reviewed and stable Respiratory status: spontaneous breathing, nonlabored ventilation and respiratory function stable Cardiovascular status: blood pressure returned to baseline and stable Postop Assessment: no apparent nausea or vomiting Anesthetic complications: no    Last Vitals:  Vitals:   09/28/18 1440 09/28/18 1459  BP:  (!) 142/81  Pulse: 86 85  Resp: (!) 21 16  Temp:  37.2 C  SpO2: 97% 95%    Last Pain:  Vitals:   09/28/18 1459  TempSrc: Oral  PainSc: 3                  Lynda Rainwater

## 2018-09-28 NOTE — Anesthesia Procedure Notes (Signed)
Procedure Name: Intubation Date/Time: 09/28/2018 12:28 PM Performed by: Eulas Post, Dvid Pendry W, CRNA Pre-anesthesia Checklist: Patient identified, Emergency Drugs available, Suction available and Patient being monitored Patient Re-evaluated:Patient Re-evaluated prior to induction Oxygen Delivery Method: Circle system utilized Preoxygenation: Pre-oxygenation with 100% oxygen Induction Type: IV induction Ventilation: Mask ventilation without difficulty Laryngoscope Size: Miller and 2 Grade View: Grade I Tube type: Oral Tube size: 7.0 mm Number of attempts: 1 Airway Equipment and Method: Stylet and Oral airway Placement Confirmation: ETT inserted through vocal cords under direct vision,  positive ETCO2 and breath sounds checked- equal and bilateral Secured at: 22 cm Tube secured with: Tape Dental Injury: Teeth and Oropharynx as per pre-operative assessment

## 2018-09-29 ENCOUNTER — Encounter (HOSPITAL_BASED_OUTPATIENT_CLINIC_OR_DEPARTMENT_OTHER): Payer: Self-pay | Admitting: Plastic Surgery

## 2018-09-29 MED ORDER — ONDANSETRON HCL 4 MG PO TABS: 4.0000 mg | ORAL_TABLET | Freq: Three times a day (TID) | ORAL | 0 refills | Status: AC | PRN

## 2018-09-29 MED ORDER — HYDROCODONE-ACETAMINOPHEN 5-325 MG PO TABS: 1.0000 | ORAL_TABLET | Freq: Four times a day (QID) | ORAL | 0 refills | Status: DC | PRN

## 2018-09-29 MED ORDER — CEPHALEXIN 500 MG PO CAPS: 500.0000 mg | ORAL_CAPSULE | Freq: Four times a day (QID) | ORAL | 0 refills | Status: AC

## 2018-09-29 NOTE — Addendum Note (Signed)
Addended by: Wallace Going on: 09/29/2018 03:31 PM   Modules accepted: Orders

## 2018-10-06 ENCOUNTER — Other Ambulatory Visit: Payer: Self-pay

## 2018-10-06 ENCOUNTER — Ambulatory Visit (INDEPENDENT_AMBULATORY_CARE_PROVIDER_SITE_OTHER): Payer: BC Managed Care – PPO | Admitting: Plastic Surgery

## 2018-10-06 ENCOUNTER — Encounter: Payer: Self-pay | Admitting: Plastic Surgery

## 2018-10-06 VITALS — BP 137/78 | HR 77 | Temp 98.3°F

## 2018-10-06 DIAGNOSIS — N6489 Other specified disorders of breast: Secondary | ICD-10-CM

## 2018-10-06 NOTE — Progress Notes (Signed)
The patient is a 63 year old female here for follow-up after undergoing breast reduction and breast symmetry surgery.  She is extremely pleased with her results.  She has a little bit of swelling in both breasts a little bit more on the right.  But overall she looks great.  The incisions are healing nicely.  There is a little bit of bruising as expected.  Nipple areole is alive and healthy.  Pictures were taken with the patient's permission and placed in the chart.  Follow-up in 1 month.  Leave the Steri-Strips on and let them come off on their own.

## 2018-10-22 DIAGNOSIS — C44722 Squamous cell carcinoma of skin of right lower limb, including hip: Secondary | ICD-10-CM | POA: Diagnosis not present

## 2018-11-05 ENCOUNTER — Telehealth: Payer: Self-pay | Admitting: Plastic Surgery

## 2018-11-05 NOTE — Telephone Encounter (Signed)

## 2018-11-06 ENCOUNTER — Ambulatory Visit (INDEPENDENT_AMBULATORY_CARE_PROVIDER_SITE_OTHER): Payer: BC Managed Care – PPO | Admitting: Plastic Surgery

## 2018-11-06 ENCOUNTER — Other Ambulatory Visit: Payer: Self-pay

## 2018-11-06 ENCOUNTER — Encounter: Payer: BC Managed Care – PPO | Admitting: Plastic Surgery

## 2018-11-06 ENCOUNTER — Encounter: Payer: Self-pay | Admitting: Plastic Surgery

## 2018-11-06 VITALS — BP 150/89 | HR 88 | Temp 97.8°F | Ht 67.5 in | Wt 151.2 lb

## 2018-11-06 DIAGNOSIS — N6489 Other specified disorders of breast: Secondary | ICD-10-CM

## 2018-11-06 DIAGNOSIS — Z853 Personal history of malignant neoplasm of breast: Secondary | ICD-10-CM

## 2018-11-06 NOTE — Progress Notes (Signed)
   Subjective:    Patient ID: Amber Ross, female    DOB: 1955-09-04, 62 y.o.   MRN: [id number]  The patient is a 63 year old female here upon her breast surgery.  She underwent symmetry surgery.  She is extremely pleased with her results.  She feels so much better and close.  All incisions are healing well.  There is no sign of seroma, hematoma or infection.   Review of Systems  Constitutional: Negative.  Negative for appetite change.  HENT: Negative.   Eyes: Negative.   Respiratory: Negative for chest tightness and shortness of breath.   Cardiovascular: Negative for chest pain.  Gastrointestinal: Negative for abdominal distention.  Genitourinary: Negative.   Musculoskeletal: Negative.   Psychiatric/Behavioral: Negative.        Objective:   Physical Exam Vitals signs and nursing note reviewed.  Constitutional:      Appearance: Normal appearance.  HENT:     Head: Normocephalic and atraumatic.  Cardiovascular:     Rate and Rhythm: Normal rate.     Pulses: Normal pulses.  Pulmonary:     Effort: Pulmonary effort is normal.  Neurological:     General: No focal deficit present.     Mental Status: She is alert. Mental status is at baseline.  Psychiatric:        Mood and Affect: Mood normal.        Behavior: Behavior normal.         Assessment & Plan:     ICD-10-CM   1. History of breast cancer in female  Z69.3   2. Postoperative breast asymmetry  N64.89     Sports bra as able and follow-up as needed. Pictures were obtained of the patient and placed in the chart with the patient's or guardian's permission.

## 2018-12-02 DIAGNOSIS — N952 Postmenopausal atrophic vaginitis: Secondary | ICD-10-CM | POA: Diagnosis not present

## 2018-12-02 DIAGNOSIS — N9411 Superficial (introital) dyspareunia: Secondary | ICD-10-CM | POA: Diagnosis not present

## 2018-12-07 DIAGNOSIS — Z79899 Other long term (current) drug therapy: Secondary | ICD-10-CM | POA: Diagnosis not present

## 2018-12-07 DIAGNOSIS — E7800 Pure hypercholesterolemia, unspecified: Secondary | ICD-10-CM | POA: Diagnosis not present

## 2018-12-09 DIAGNOSIS — Z0000 Encounter for general adult medical examination without abnormal findings: Secondary | ICD-10-CM | POA: Diagnosis not present

## 2018-12-15 DIAGNOSIS — R350 Frequency of micturition: Secondary | ICD-10-CM | POA: Diagnosis not present

## 2018-12-15 DIAGNOSIS — R8290 Unspecified abnormal findings in urine: Secondary | ICD-10-CM | POA: Diagnosis not present

## 2019-04-01 ENCOUNTER — Other Ambulatory Visit: Payer: Self-pay | Admitting: Family Medicine

## 2019-04-01 DIAGNOSIS — R030 Elevated blood-pressure reading, without diagnosis of hypertension: Secondary | ICD-10-CM | POA: Diagnosis not present

## 2019-04-01 DIAGNOSIS — I10 Essential (primary) hypertension: Secondary | ICD-10-CM

## 2019-04-19 ENCOUNTER — Other Ambulatory Visit: Payer: BC Managed Care – PPO

## 2019-04-26 ENCOUNTER — Encounter (INDEPENDENT_AMBULATORY_CARE_PROVIDER_SITE_OTHER): Payer: Self-pay

## 2019-04-26 ENCOUNTER — Ambulatory Visit
Admission: RE | Admit: 2019-04-26 | Discharge: 2019-04-26 | Disposition: A | Discharge status: Home / Self Care | Payer: BC Managed Care – PPO | Source: Ambulatory Visit | Attending: Family Medicine | Admitting: Family Medicine

## 2019-04-26 DIAGNOSIS — I10 Essential (primary) hypertension: Secondary | ICD-10-CM | POA: Diagnosis not present

## 2019-05-17 DIAGNOSIS — B349 Viral infection, unspecified: Secondary | ICD-10-CM | POA: Diagnosis not present

## 2019-05-17 DIAGNOSIS — R5383 Other fatigue: Secondary | ICD-10-CM | POA: Diagnosis not present

## 2019-05-17 DIAGNOSIS — U071 COVID-19: Secondary | ICD-10-CM

## 2019-05-17 HISTORY — DX: COVID-19: U07.1

## 2019-05-18 DIAGNOSIS — U071 COVID-19: Secondary | ICD-10-CM | POA: Diagnosis not present

## 2019-08-31 ENCOUNTER — Other Ambulatory Visit: Payer: Self-pay | Admitting: Family Medicine

## 2019-08-31 DIAGNOSIS — Z1231 Encounter for screening mammogram for malignant neoplasm of breast: Secondary | ICD-10-CM

## 2019-10-13 ENCOUNTER — Other Ambulatory Visit: Payer: Self-pay

## 2019-10-13 ENCOUNTER — Ambulatory Visit
Admission: RE | Admit: 2019-10-13 | Discharge: 2019-10-13 | Disposition: A | Discharge status: Home / Self Care | Payer: BC Managed Care – PPO | Source: Ambulatory Visit | Attending: Family Medicine | Admitting: Family Medicine

## 2019-10-13 DIAGNOSIS — Z780 Asymptomatic menopausal state: Secondary | ICD-10-CM | POA: Diagnosis not present

## 2019-10-13 DIAGNOSIS — Z01419 Encounter for gynecological examination (general) (routine) without abnormal findings: Secondary | ICD-10-CM | POA: Diagnosis not present

## 2019-10-13 DIAGNOSIS — Z6825 Body mass index (BMI) 25.0-25.9, adult: Secondary | ICD-10-CM | POA: Diagnosis not present

## 2019-10-13 DIAGNOSIS — Z130 Encounter for screening for diseases of the blood and blood-forming organs and certain disorders involving the immune mechanism: Secondary | ICD-10-CM | POA: Diagnosis not present

## 2019-10-13 DIAGNOSIS — Z1231 Encounter for screening mammogram for malignant neoplasm of breast: Secondary | ICD-10-CM

## 2019-10-13 DIAGNOSIS — Z124 Encounter for screening for malignant neoplasm of cervix: Secondary | ICD-10-CM | POA: Diagnosis not present

## 2019-10-13 DIAGNOSIS — Z1151 Encounter for screening for human papillomavirus (HPV): Secondary | ICD-10-CM | POA: Diagnosis not present

## 2019-11-09 ENCOUNTER — Other Ambulatory Visit: Payer: Self-pay

## 2019-11-09 ENCOUNTER — Other Ambulatory Visit: Payer: Self-pay | Admitting: Family Medicine

## 2019-11-09 ENCOUNTER — Ambulatory Visit
Admission: RE | Admit: 2019-11-09 | Discharge: 2019-11-09 | Disposition: A | Discharge status: Home / Self Care | Payer: BC Managed Care – PPO | Source: Ambulatory Visit | Attending: Family Medicine | Admitting: Family Medicine

## 2019-11-09 DIAGNOSIS — R05 Cough: Secondary | ICD-10-CM | POA: Diagnosis not present

## 2019-11-09 DIAGNOSIS — R059 Cough, unspecified: Secondary | ICD-10-CM

## 2019-11-09 DIAGNOSIS — R300 Dysuria: Secondary | ICD-10-CM | POA: Diagnosis not present

## 2019-12-13 ENCOUNTER — Other Ambulatory Visit: Payer: Self-pay | Admitting: Family Medicine

## 2019-12-13 DIAGNOSIS — E2839 Other primary ovarian failure: Secondary | ICD-10-CM

## 2020-03-29 ENCOUNTER — Other Ambulatory Visit: Payer: Self-pay

## 2020-03-29 ENCOUNTER — Ambulatory Visit
Admission: RE | Admit: 2020-03-29 | Discharge: 2020-03-29 | Disposition: A | Discharge status: Home / Self Care | Payer: BC Managed Care – PPO | Source: Ambulatory Visit | Attending: Family Medicine | Admitting: Family Medicine

## 2020-03-29 DIAGNOSIS — E2839 Other primary ovarian failure: Secondary | ICD-10-CM

## 2020-06-15 ENCOUNTER — Other Ambulatory Visit: Payer: Self-pay | Admitting: Physician Assistant

## 2020-06-15 ENCOUNTER — Ambulatory Visit
Admission: RE | Admit: 2020-06-15 | Discharge: 2020-06-15 | Disposition: A | Discharge status: Home / Self Care | Payer: BC Managed Care – PPO | Source: Ambulatory Visit | Attending: Physician Assistant | Admitting: Physician Assistant

## 2020-06-15 DIAGNOSIS — R0602 Shortness of breath: Secondary | ICD-10-CM

## 2020-06-15 DIAGNOSIS — E7800 Pure hypercholesterolemia, unspecified: Secondary | ICD-10-CM | POA: Diagnosis not present

## 2020-07-06 ENCOUNTER — Encounter: Payer: Self-pay | Admitting: Family Medicine

## 2020-07-11 ENCOUNTER — Encounter: Payer: Self-pay | Admitting: Cardiology

## 2020-07-11 ENCOUNTER — Ambulatory Visit: Payer: BC Managed Care – PPO | Admitting: Cardiology

## 2020-07-11 ENCOUNTER — Other Ambulatory Visit: Payer: Self-pay

## 2020-07-11 VITALS — BP 143/91 | HR 81 | Ht 67.0 in | Wt 172.2 lb

## 2020-07-11 DIAGNOSIS — I10 Essential (primary) hypertension: Secondary | ICD-10-CM | POA: Diagnosis not present

## 2020-07-11 DIAGNOSIS — R0609 Other forms of dyspnea: Secondary | ICD-10-CM

## 2020-07-11 DIAGNOSIS — R0600 Dyspnea, unspecified: Secondary | ICD-10-CM | POA: Diagnosis not present

## 2020-07-11 DIAGNOSIS — E7800 Pure hypercholesterolemia, unspecified: Secondary | ICD-10-CM | POA: Diagnosis not present

## 2020-07-11 MED ORDER — METOPROLOL TARTRATE 100 MG PO TABS: ORAL_TABLET | ORAL | 0 refills | Status: DC

## 2020-07-11 NOTE — Progress Notes (Signed)
Cardiology Office Note    Date:  07/11/2020   ID:  Geoffrey, Mankin 1955/06/04, MRN [id number]  PCP:  Mayra Neer, MD  Cardiologist:  Fransico Him, MD   Chief Complaint  Patient presents with   New Patient (Initial Visit)    DOE    History of Present Illness:  Amber Ross is a 65 y.o. female who is being seen today for the evaluation of SOB at the request of Mayra Neer, MD.  This is a 65yo female with a hx of ADHD, breast CA, remote COVID 16, HLD, HTN, GERD, OSA on PAP who is referred for evaluation of SOB. She was dx with COVID 19 in January but had been having SOB prior to that starting last summer but over the past 3 months has worsened.  The SOB is mainly exertional and sometimes when she is talking.  She has not had any chest pain or pressure but has noticed that her heart will race when she gets SOB but no skipped beats.  She has some problems with dizziness if she stands up too fast and sometimes feels like she may pass out.  She has been sedentary over the past 2 years and has been working from home and has gained weight.  Her last LDL was elevated in Feb at 132 and HLD 62, CBC was normal and TSH was normal.   Past Medical History:  Diagnosis Date   ADHD    Atrophic vaginitis    Breast cancer ([place])    left breast cancer 20 yrs ago   Cough    COVID 05/17/2019   Depression    Elevated cholesterol    Elevated cholesterol    Estrogen deficiency    Gastric reflux    Hypertension    OSA on CPAP    Osteopenia    Personal history of radiation therapy    Psoriasis    SOB (shortness of breath)     Past Surgical History:  Procedure Laterality Date   BREAST BIOPSY     BREAST LUMPECTOMY     BREAST REDUCTION SURGERY Right 09/28/2018   Procedure: MAMMARY REDUCTION  (BREAST);  Surgeon: Wallace Going, DO;  Location: [facility];  Service: Plastics;  Laterality: Right;   CESAREAN SECTION     FACIAL COSMETIC  SURGERY     MASTOPEXY Left 09/28/2018   Procedure: MASTOPEXY;  Surgeon: Wallace Going, DO;  Location: [facility];  Service: Plastics;  Laterality: Left;    Current Medications: Current Meds  Medication Sig   amphetamine-dextroamphetamine (ADDERALL) 10 MG tablet Take 2 tablets by mouth in the morning, and 1 tablet by mouth in the evening.   Cholecalciferol (VITAMIN D3) 125 MCG (5000 UT) CAPS Take by mouth.   lisinopril-hydrochlorothiazide (ZESTORETIC) 20-12.5 MG tablet Take 1 tablet by mouth daily.   Multiple Vitamins-Minerals (HAIR/SKIN/NAILS) CAPS Take by mouth.   omeprazole (PRILOSEC) 20 MG capsule Take 20 mg by mouth daily.   venlafaxine XR (EFFEXOR-XR) 150 MG 24 hr capsule 1 capsule with food   vitamin B-12 (CYANOCOBALAMIN) 1000 MCG tablet Take 1,000 mcg by mouth daily.    Allergies:   Patient has no known allergies.   Social History   Socioeconomic History   Marital status: Divorced    Spouse name: Not on file   Number of children: Not on file   Years of education: Not on file   Highest education level: Not on file  Occupational History  Occupation: Surveyor, mining   Tobacco Use   Smoking status: Former Smoker    Packs/day: 0.25    Years: 15.00    Pack years: 3.75    Types: Cigarettes    Quit date: 04/22/1989    Years since quitting: 31.2   Smokeless tobacco: Never Used  Substance and Sexual Activity   Alcohol use: Yes    Comment: 1-2 times per month   Drug use: No   Sexual activity: Not on file  Other Topics Concern   Not on file  Social History Narrative   Not on file   Social Determinants of Health   Financial Resource Strain: Not on file  Food Insecurity: Not on file  Transportation Needs: Not on file  Physical Activity: Not on file  Stress: Not on file  Social Connections: Not on file     Family History:  The patient's family history is not on file.   ROS:   Please see the history of present illness.     ROS All other systems reviewed and are negative.  No flowsheet data found.     PHYSICAL EXAM:   VS:  BP (!) 143/91    Pulse 81    Ht 5\' 7"  (1.702 m)    Wt 172 lb 3.2 oz (78.1 kg)    SpO2 99%    BMI 26.97 kg/m    GEN: Well nourished, well developed, in no acute distress  HEENT: normal  Neck: no JVD, carotid bruits, or masses Cardiac: RRR; no murmurs, rubs, or gallops,no edema.  Intact distal pulses bilaterally.  Respiratory:  clear to auscultation bilaterally, normal work of breathing GI: soft, nontender, nondistended, + BS MS: no deformity or atrophy  Skin: warm and dry, no rash Neuro:  Alert and Oriented x 3, Strength and sensation are intact Psych: euthymic mood, full affect  Wt Readings from Last 3 Encounters:  07/11/20 172 lb 3.2 oz (78.1 kg)  11/06/18 151 lb 3.2 oz (68.6 kg)  09/28/18 145 lb 4.5 oz (65.9 kg)      Studies/Labs Reviewed:   EKG:  EKG is not ordered today.  The ekg done at PCP office showed NSR with no ST changes  Recent Labs: No results found for requested labs within last 8760 hours.   Lipid Panel No results found for: CHOL, TRIG, HDL, CHOLHDL, VLDL, LDLCALC, LDLDIRECT  Additional studies/ records that were reviewed today include:  OV notes and labs from PCP    ASSESSMENT:    1. DOE (dyspnea on exertion)   2. Primary hypertension   3. Pure hypercholesterolemia      PLAN:  In order of problems listed above:  1. DOE -suspect this is due to weight gain and sedentary state over the past 2 years and possible some post COVID syndrome -she does have CRFs including HTN, HLD and remote tobacco use -EKG at PCP office was personally reviewed and showed NSR with no ST changes -Need to consider DOE as anginal equivalent so will get a coronary CTA to define coronary anatomy -check 2D echo to assess LVF  2.  HTN -BP borderline controlled on exam today -continue Lisinopril-HCT 20-12.5mg  daily  3. HLD -LDL goal preferably < 100 -Last LDL was  134 but has been has high as the 170's -if she is found to have CAD then will need LDL  < 70 -currently managing with diet     Medication Adjustments/Labs and Tests Ordered: Current medicines are reviewed at length with the patient  today.  Concerns regarding medicines are outlined above.  Medication changes, Labs and Tests ordered today are listed in the Patient Instructions below.  There are no Patient Instructions on file for this visit.   Signed, Fransico Him, MD  07/11/2020 10:36 AM    [facility] [address], [place], [place]  [address] Phone: [phone]; Fax: [phone]

## 2020-07-11 NOTE — Patient Instructions (Addendum)
Medication Instructions:  Your physician recommends that you continue on your current medications as directed. Please refer to the Current Medication list given to you today.  *If you need a refill on your cardiac medications before your next appointment, please call your pharmacy*  Labs: You will need a BMET prior to your CT scan.  Testing/Procedures: Your physician has requested that you have an echocardiogram. Echocardiography is a painless test that uses sound waves to create images of your heart. It provides your doctor with information about the size and shape of your heart and how well your heart's chambers and valves are working. This procedure takes approximately one hour. There are no restrictions for this procedure.  Your physician has requested that you have a coronary CTA scan. Please see below for further instructions.  Follow-Up: At [facility], you and your health needs are our priority.  As part of our continuing mission to provide you with exceptional heart care, we have created designated Provider Care Teams.  These Care Teams include your primary Cardiologist (physician) and Advanced Practice Providers (APPs -  Physician Assistants and Nurse Practitioners) who all work together to provide you with the care you need, when you need it.  Follow up with Dr. Radford Pax as needed based on results of testing.    Other Instructions Your cardiac CT will be scheduled at:  [facility] [address] [place], [place] [address] [phone]  Please arrive at the [facility] main entrance (entrance A) of [facility] 30 minutes prior to test start time. Proceed to the [facility] Radiology Department (first floor) to check-in and test prep.  Please follow these instructions carefully (unless otherwise directed):  On the Night Before the Test: . Be sure to Drink plenty of water. . Do not consume any caffeinated/decaffeinated beverages or chocolate 12 hours  prior to your test. . Do not take any antihistamines 12 hours prior to your test.  On the Day of the Test: . Drink plenty of water until 1 hour prior to the test. . Do not eat any food 4 hours prior to the test. . You may take your regular medications prior to the test.  . Take metoprolol (Lopressor) two hours prior to test. . HOLD Hydrochlorothiazide morning of the test. . FEMALES- please wear underwire-free bra if available      After the Test: . Drink plenty of water. . After receiving IV contrast, you may experience a mild flushed feeling. This is normal. . On occasion, you may experience a mild rash up to 24 hours after the test. This is not dangerous. If this occurs, you can take Benadryl 25 mg and increase your fluid intake. . If you experience trouble breathing, this can be serious. If it is severe call 911 IMMEDIATELY. If it is mild, please call our office. . If you take any of these medications: Glipizide/Metformin, Avandament, Glucavance, please do not take 48 hours after completing test unless otherwise instructed.   Once we have confirmed authorization from your insurance company, we will call you to set up a date and time for your test. Based on how quickly your insurance processes prior authorizations requests, please allow up to 4 weeks to be contacted for scheduling your Cardiac CT appointment. Be advised that routine Cardiac CT appointments could be scheduled as many as 8 weeks after your provider has ordered it.  For non-scheduling related questions, please contact the cardiac imaging nurse navigator should you have any questions/concerns: Clarise Cruz  Juleen China, Cardiac Imaging Nurse Navigator Gordy Clement, Cardiac Imaging Nurse Navigator [facility] Direct Office Dial: [phone]   For scheduling needs, including cancellations and rescheduling, please call [place], [phone].

## 2020-07-24 DIAGNOSIS — F322 Major depressive disorder, single episode, severe without psychotic features: Secondary | ICD-10-CM | POA: Diagnosis not present

## 2020-08-07 ENCOUNTER — Ambulatory Visit (HOSPITAL_COMMUNITY): Payer: BC Managed Care – PPO | Attending: Cardiology

## 2020-08-07 ENCOUNTER — Other Ambulatory Visit: Payer: Self-pay

## 2020-08-07 DIAGNOSIS — R0609 Other forms of dyspnea: Secondary | ICD-10-CM

## 2020-08-07 DIAGNOSIS — R0600 Dyspnea, unspecified: Secondary | ICD-10-CM | POA: Diagnosis not present

## 2020-08-07 LAB — ECHOCARDIOGRAM COMPLETE
Area-P 1/2: 3.65 cm2
P 1/2 time: 1265 msec
S' Lateral: 2.8 cm

## 2020-08-08 ENCOUNTER — Telehealth: Payer: Self-pay

## 2020-08-08 ENCOUNTER — Telehealth (HOSPITAL_COMMUNITY): Payer: Self-pay | Admitting: *Deleted

## 2020-08-08 ENCOUNTER — Other Ambulatory Visit: Payer: BC Managed Care – PPO | Admitting: *Deleted

## 2020-08-08 DIAGNOSIS — I10 Essential (primary) hypertension: Secondary | ICD-10-CM | POA: Diagnosis not present

## 2020-08-08 DIAGNOSIS — R0609 Other forms of dyspnea: Secondary | ICD-10-CM

## 2020-08-08 DIAGNOSIS — R0600 Dyspnea, unspecified: Secondary | ICD-10-CM

## 2020-08-08 LAB — BASIC METABOLIC PANEL
BUN/Creatinine Ratio: 17 (ref 12–28)
BUN: 15 mg/dL (ref 8–27)
CO2: 24 mmol/L (ref 20–29)
Calcium: 9.9 mg/dL (ref 8.7–10.3)
Chloride: 99 mmol/L (ref 96–106)
Creatinine, Ser: 0.9 mg/dL (ref 0.57–1.00)
Glucose: 115 mg/dL — ABNORMAL HIGH (ref 65–99)
Potassium: 3.8 mmol/L (ref 3.5–5.2)
Sodium: 139 mmol/L (ref 134–144)
eGFR: 71 mL/min/{1.73_m2} (ref >59)

## 2020-08-08 NOTE — Telephone Encounter (Signed)
The patient has been notified of the result and verbalized understanding.  All questions (if any) were answered. Antonieta Iba, RN 08/08/2020 3:02 PM

## 2020-08-08 NOTE — Telephone Encounter (Signed)
Attempted to call patient regarding upcoming cardiac CT appointment. °Left message on voicemail with name and callback number ° °Starr Urias RN Navigator Cardiac Imaging °[facility] °[phone] Office °[phone] Cell ° °

## 2020-08-08 NOTE — Telephone Encounter (Signed)
-----   Message from [name], MD sent at 08/07/2020  2:30 PM EDT ----- Please have her come in for BNP

## 2020-08-09 ENCOUNTER — Other Ambulatory Visit (HOSPITAL_COMMUNITY): Payer: Self-pay | Admitting: *Deleted

## 2020-08-09 ENCOUNTER — Telehealth (HOSPITAL_COMMUNITY): Payer: Self-pay | Admitting: *Deleted

## 2020-08-09 MED ORDER — METOPROLOL TARTRATE 100 MG PO TABS: ORAL_TABLET | ORAL | 0 refills | Status: DC

## 2020-08-09 NOTE — Telephone Encounter (Signed)
Pt returning call regarding upcoming cardiac imaging study; pt verbalizes understanding of appt date/time, parking situation and where to check in, pre-test NPO status and medications ordered, and verified current allergies; name and call back number provided for further questions should they arise  Gordy Clement RN Navigator Cardiac Imaging [facility] [phone] office [phone] cell  Pt to take 100mg  metoprolol tartrate 2 hours prior to cardiac CT scan.

## 2020-08-10 ENCOUNTER — Encounter: Payer: Self-pay | Admitting: Cardiology

## 2020-08-10 ENCOUNTER — Encounter (HOSPITAL_COMMUNITY): Payer: Self-pay

## 2020-08-10 ENCOUNTER — Ambulatory Visit (HOSPITAL_COMMUNITY)
Admission: RE | Admit: 2020-08-10 | Discharge: 2020-08-10 | Disposition: A | Discharge status: Home / Self Care | Payer: BC Managed Care – PPO | Source: Ambulatory Visit | Attending: Cardiology | Admitting: Cardiology

## 2020-08-10 ENCOUNTER — Other Ambulatory Visit: Payer: Self-pay

## 2020-08-10 DIAGNOSIS — R931 Abnormal findings on diagnostic imaging of heart and coronary circulation: Secondary | ICD-10-CM | POA: Insufficient documentation

## 2020-08-10 DIAGNOSIS — R0600 Dyspnea, unspecified: Secondary | ICD-10-CM | POA: Diagnosis not present

## 2020-08-10 DIAGNOSIS — R0609 Other forms of dyspnea: Secondary | ICD-10-CM

## 2020-08-10 DIAGNOSIS — Z006 Encounter for examination for normal comparison and control in clinical research program: Secondary | ICD-10-CM

## 2020-08-10 DIAGNOSIS — Q211 Atrial septal defect: Secondary | ICD-10-CM | POA: Diagnosis not present

## 2020-08-10 DIAGNOSIS — Q2112 Patent foramen ovale: Secondary | ICD-10-CM | POA: Insufficient documentation

## 2020-08-10 DIAGNOSIS — I700 Atherosclerosis of aorta: Secondary | ICD-10-CM | POA: Diagnosis not present

## 2020-08-10 HISTORY — DX: Patent foramen ovale: Q21.12

## 2020-08-10 HISTORY — DX: Atherosclerosis of aorta: I70.0

## 2020-08-10 HISTORY — DX: Atrial septal defect: Q21.1

## 2020-08-10 HISTORY — DX: Abnormal findings on diagnostic imaging of heart and coronary circulation: R93.1

## 2020-08-10 MED ORDER — IOHEXOL 350 MG/ML SOLN
95.0000 mL | Freq: Once | INTRAVENOUS | Status: AC | PRN
  Administered 2020-08-10: 95 mL via INTRAVENOUS

## 2020-08-10 MED ORDER — NITROGLYCERIN 0.4 MG SL SUBL
0.8000 mg | SUBLINGUAL_TABLET | Freq: Once | SUBLINGUAL | Status: AC
  Administered 2020-08-10: 0.8 mg via SUBLINGUAL

## 2020-08-10 MED ORDER — NITROGLYCERIN 0.4 MG SL SUBL
SUBLINGUAL_TABLET | SUBLINGUAL | Status: AC
  Filled 2020-08-10: qty 2

## 2020-08-10 NOTE — Research (Signed)
IDENTIFY Informed Consent                  Subject Name: Amber Ross   Subject met inclusion and exclusion criteria.  The informed consent form, study requirements and expectations were reviewed with the subject and questions and concerns were addressed prior to the signing of the consent form.  The subject verbalized understanding of the trial requirements.  The subject agreed to participate in the IDENTIFY trial and signed the informed consent at 08:15AM on 08/10/20.  The informed consent was obtained prior to performance of any protocol-specific procedures for the subject.  A copy of the signed informed consent was given to the subject and a copy was placed in the subject's medical record.  Sallye Ober , Research Assistant

## 2020-08-11 ENCOUNTER — Telehealth: Payer: Self-pay | Admitting: Cardiology

## 2020-08-11 DIAGNOSIS — E7800 Pure hypercholesterolemia, unspecified: Secondary | ICD-10-CM

## 2020-08-11 NOTE — Telephone Encounter (Signed)
[name], MD  08/10/2020 5:11 PM EDT      Very mild coronary Ca with minimal plaque in the pLAD and small hole between the 2 upper chambers of her heart called a PFO >>nothing needs to be done about that at this time as right side of heart is normal on echo. Please get copy of last FLP   The patient has been notified of the result and verbalized understanding.  All questions (if any) were answered. Amber Iba, RN 08/11/2020 4:31 PM   Patient's most recent FLP (from [facility]):  Cholesterol, total 211.000 m 06/15/2020 HDL 62.000 mg 06/15/2020 LDL 132.000 m 06/15/2020 Triglycerides 101.000 m 06/15/2020

## 2020-08-11 NOTE — Telephone Encounter (Signed)
Patient was returning call for results 

## 2020-08-15 MED ORDER — ATORVASTATIN CALCIUM 20 MG PO TABS: 20.0000 mg | ORAL_TABLET | Freq: Every day | ORAL | 3 refills | Status: DC

## 2020-08-15 NOTE — Addendum Note (Signed)
Addended by: Antonieta Iba on: 08/15/2020 02:49 PM   Modules accepted: Orders

## 2020-08-15 NOTE — Telephone Encounter (Signed)
The patient has been notified of the result and verbalized understanding.  All questions (if any) were answered. Antonieta Iba, RN 08/15/2020 11:05 AM  Rx has been sent in for atorvastatin 20 mg daily. Repeat labs have been scheduled.

## 2020-10-03 ENCOUNTER — Other Ambulatory Visit: Payer: BC Managed Care – PPO | Admitting: *Deleted

## 2020-10-03 ENCOUNTER — Other Ambulatory Visit: Payer: Self-pay

## 2020-10-03 DIAGNOSIS — E7800 Pure hypercholesterolemia, unspecified: Secondary | ICD-10-CM | POA: Diagnosis not present

## 2020-10-03 LAB — LIPID PANEL
Chol/HDL Ratio: 4.4 ratio (ref 0.0–4.4)
Cholesterol, Total: 214 mg/dL — ABNORMAL HIGH (ref 100–199)
HDL: 49 mg/dL (ref >39)
LDL Chol Calc (NIH): 148 mg/dL — ABNORMAL HIGH (ref 0–99)
Triglycerides: 95 mg/dL (ref 0–149)
VLDL Cholesterol Cal: 17 mg/dL (ref 5–40)

## 2020-10-03 LAB — ALT: ALT: 10 IU/L (ref 0–32)

## 2020-11-22 ENCOUNTER — Telehealth: Payer: Self-pay

## 2020-11-22 DIAGNOSIS — Z006 Encounter for examination for normal comparison and control in clinical research program: Secondary | ICD-10-CM

## 2020-11-22 NOTE — Telephone Encounter (Addendum)
I have attempted without success to contact this patient by phone for her Identify 90 day follow up phone call. I left a message for patient to return my phone call with my name and callback number. An e-mail was also sent to patient.  

## 2020-12-13 DIAGNOSIS — E7800 Pure hypercholesterolemia, unspecified: Secondary | ICD-10-CM | POA: Diagnosis not present

## 2020-12-13 DIAGNOSIS — I10 Essential (primary) hypertension: Secondary | ICD-10-CM | POA: Diagnosis not present

## 2020-12-13 DIAGNOSIS — Z0000 Encounter for general adult medical examination without abnormal findings: Secondary | ICD-10-CM | POA: Diagnosis not present

## 2020-12-19 ENCOUNTER — Telehealth: Payer: Self-pay

## 2020-12-19 DIAGNOSIS — E7800 Pure hypercholesterolemia, unspecified: Secondary | ICD-10-CM

## 2020-12-19 MED ORDER — ATORVASTATIN CALCIUM 40 MG PO TABS: 40.0000 mg | ORAL_TABLET | Freq: Every day | ORAL | 3 refills | Status: DC

## 2020-12-19 NOTE — Telephone Encounter (Signed)
-----   Message from [name], MD sent at 12/16/2020  2:10 PM EDT ----- LDL goal < 70 - increase Atorvastatin to '40mg'$  daily and repeat FLP and ALT in 6 weeks

## 2020-12-19 NOTE — Telephone Encounter (Signed)
The patient has been notified of the result and verbalized understanding.  All questions (if any) were answered. Antonieta Iba, RN 12/19/2020 11:36 AM  Rx has been sent in or atorvastatin 40 daily. Repeat lab work has been scheduled.

## 2021-02-07 ENCOUNTER — Other Ambulatory Visit: Payer: Self-pay | Admitting: Family Medicine

## 2021-02-07 DIAGNOSIS — Z1231 Encounter for screening mammogram for malignant neoplasm of breast: Secondary | ICD-10-CM

## 2021-02-16 ENCOUNTER — Other Ambulatory Visit: Payer: BC Managed Care – PPO

## 2021-02-20 ENCOUNTER — Other Ambulatory Visit: Payer: BC Managed Care – PPO

## 2021-03-06 ENCOUNTER — Other Ambulatory Visit: Payer: Self-pay

## 2021-03-06 ENCOUNTER — Other Ambulatory Visit: Payer: BC Managed Care – PPO | Admitting: *Deleted

## 2021-03-06 ENCOUNTER — Ambulatory Visit
Admission: RE | Admit: 2021-03-06 | Discharge: 2021-03-06 | Disposition: A | Discharge status: Home / Self Care | Payer: BC Managed Care – PPO | Source: Ambulatory Visit | Attending: Family Medicine | Admitting: Family Medicine

## 2021-03-06 DIAGNOSIS — Z1231 Encounter for screening mammogram for malignant neoplasm of breast: Secondary | ICD-10-CM

## 2021-03-06 DIAGNOSIS — E7800 Pure hypercholesterolemia, unspecified: Secondary | ICD-10-CM

## 2021-03-06 LAB — LIPID PANEL
Chol/HDL Ratio: 2.4 ratio (ref 0.0–4.4)
Cholesterol, Total: 159 mg/dL (ref 100–199)
HDL: 67 mg/dL (ref >39)
LDL Chol Calc (NIH): 75 mg/dL (ref 0–99)
Triglycerides: 96 mg/dL (ref 0–149)
VLDL Cholesterol Cal: 17 mg/dL (ref 5–40)

## 2021-03-06 LAB — ALT: ALT: 16 IU/L (ref 0–32)

## 2021-03-07 ENCOUNTER — Telehealth: Payer: Self-pay

## 2021-03-07 DIAGNOSIS — E7800 Pure hypercholesterolemia, unspecified: Secondary | ICD-10-CM

## 2021-03-07 NOTE — Telephone Encounter (Signed)
Patient notified via [facility]. Labs have been ordered.

## 2021-03-07 NOTE — Telephone Encounter (Signed)
-----   Message from [name], MD sent at 03/07/2021 10:49 AM EST ----- LDL is near goal.  Please encourage her to cut back on carbs and sugars as well as fatty foods and eat more fruits and vegetables.  Repeat labs in 4 months

## 2021-10-01 ENCOUNTER — Other Ambulatory Visit: Payer: Self-pay

## 2021-10-10 ENCOUNTER — Other Ambulatory Visit: Payer: Self-pay

## 2021-10-10 MED ORDER — ATORVASTATIN CALCIUM 40 MG PO TABS: 40.0000 mg | ORAL_TABLET | Freq: Every day | ORAL | 0 refills | Status: AC

## 2021-12-14 DIAGNOSIS — Z0000 Encounter for general adult medical examination without abnormal findings: Secondary | ICD-10-CM | POA: Diagnosis not present

## 2021-12-14 DIAGNOSIS — I10 Essential (primary) hypertension: Secondary | ICD-10-CM | POA: Diagnosis not present

## 2021-12-14 DIAGNOSIS — E7800 Pure hypercholesterolemia, unspecified: Secondary | ICD-10-CM | POA: Diagnosis not present

## 2022-03-31 DIAGNOSIS — M25511 Pain in right shoulder: Secondary | ICD-10-CM | POA: Diagnosis not present

## 2022-05-02 IMAGING — CT CT HEART MORP W/ CTA COR W/ SCORE W/ CA W/CM &/OR W/O CM
4 of 7 series · 8 of 20 positions shown, 9 images · IV contrast (APPLIED)
Comparison: None.
COMPARISON: None.

Addendum:
EXAM:
OVER-READ INTERPRETATION  CT CHEST

The following report is an over-read performed by radiologist Dr.
Ginjiro Furu [REDACTED] on 08/10/2020. This
over-read does not include interpretation of cardiac or coronary
anatomy or pathology. The coronary calcium score/coronary CTA
interpretation by the cardiologist is attached.
CLINICAL DATA: Chest pain
Cardiac CTA
MEDICATIONS:
Sub lingual nitro. 4mg x 2
TECHNIQUE: The patient was scanned on a Siemens [REDACTED]ice scanner. Gantry
rotation speed was 250 msecs. Collimation was 0.6 mm. A 100 kV
prospective scan was triggered in the ascending thoracic aorta at
35-75% of the R-R interval. Average HR during the scan was bpm. The
3D data set was interpreted on a dedicated work station using MPR,
MIP and VRT modes. A total of 80cc of contrast was used.

[Series 6: best diast 74 % · axial · 0.39mm/px · z∈[+1203,+1244]mm · 2 of 313 slices shown]
[im 105/313  vessel]
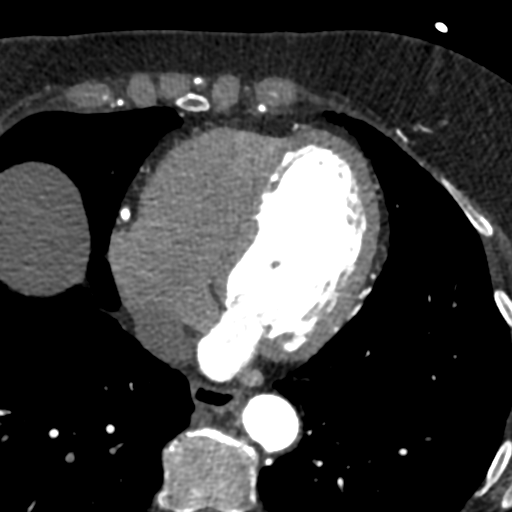
[im 209/313  vessel]
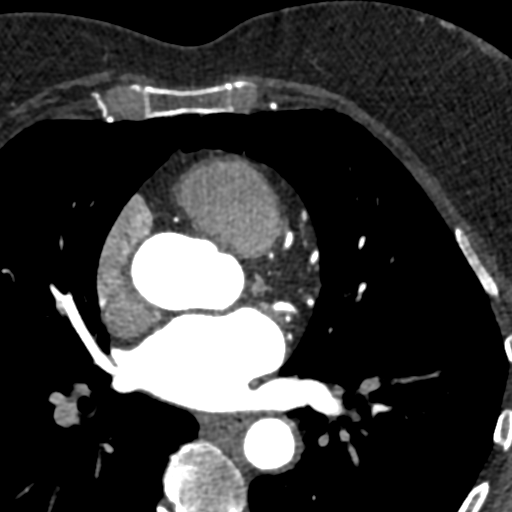

[Series 7: best syst · axial · 0.39mm/px · z∈[+1203,+1244]mm · 2 of 313 slices shown, 3 images]
[im 105/313  vessel]
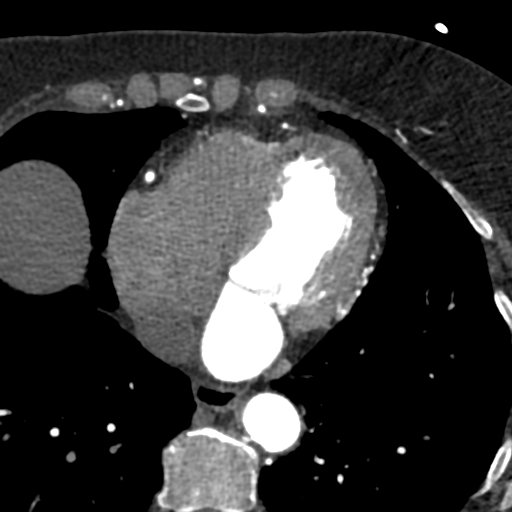
[im 105/313  lung]
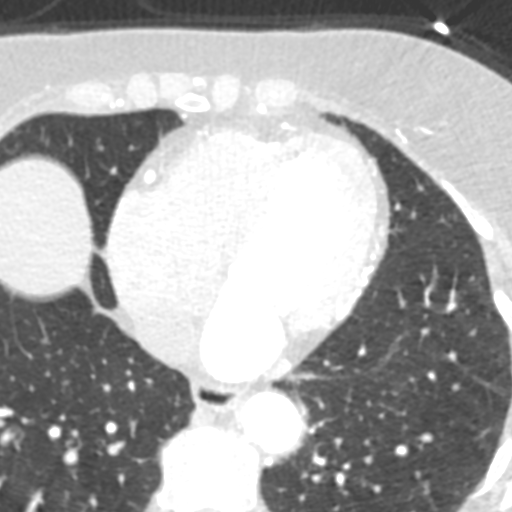
[im 209/313  vessel]
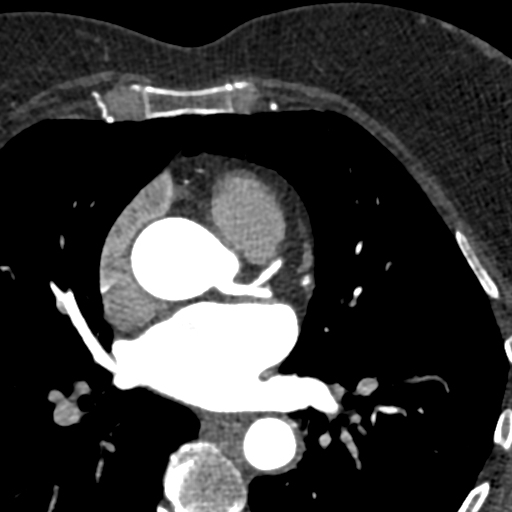

[Series 8: ts diast sharp 74 % · axial · 0.39mm/px · z∈[+1203,+1244]mm · 2 of 313 slices shown]
[im 105/313  lung]
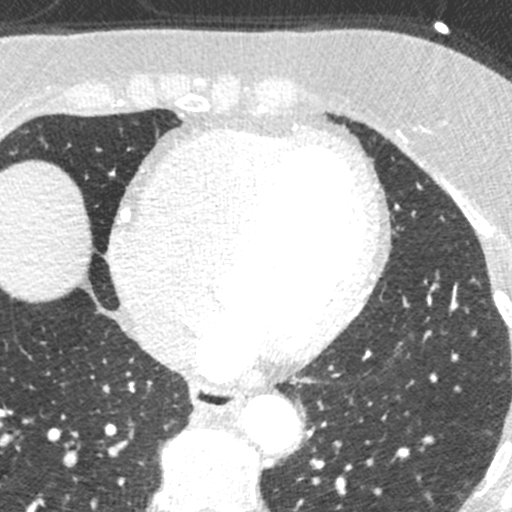
[im 209/313  lung]
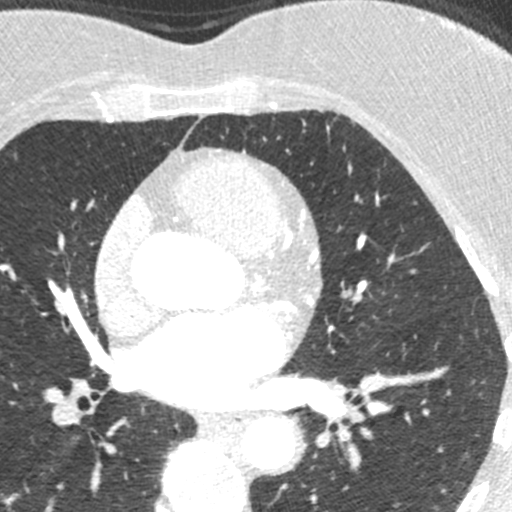

[Series 9: ts syst sharp · axial · 0.39mm/px · z∈[+1203,+1244]mm · 2 of 313 slices shown]
[im 105/313  lung]
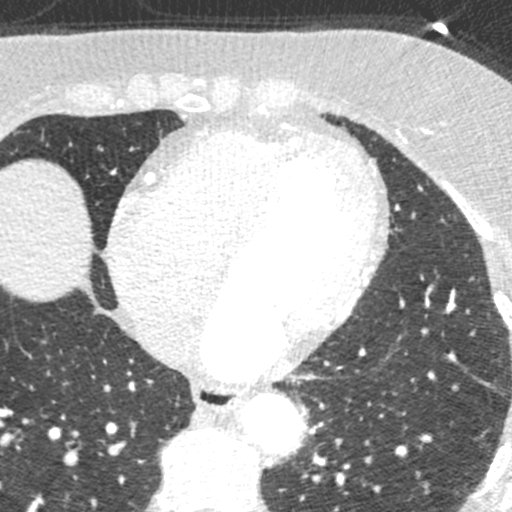
[im 209/313  lung]
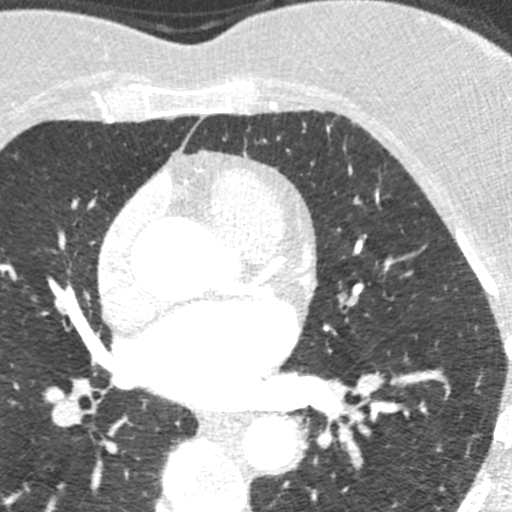

[8 of 20 positions shown; findings below may reference images not displayed]

FINDINGS: Atherosclerotic calcifications in the thoracic aorta. Within the
visualized portions of the thorax there are no suspicious appearing
pulmonary nodules or masses, there is no acute consolidative
airspace disease, no pleural effusions, no pneumothorax and no
lymphadenopathy. Visualized portions of the upper abdomen are
unremarkable. There are no aggressive appearing lytic or blastic
lesions noted in the visualized portions of the skeleton.
IMPRESSION: 1.  Aortic Atherosclerosis (D3BPI-6CZ.Z).
FINDINGS: Non-cardiac: See separate report from [REDACTED].

No LA appendage thrombus. Suspect small PFO. The pulmonary veins
drain normally to the left atrium.

Calcium Score: 2.6 Agatston units.

Coronary Arteries: Right dominant with no anomalies

LM: No plaque or stenosis.

LAD system: Calcified plaque proximal LAD, minimal stenosis.

Circumflex system: No plaque or stenosis.

RCA system: No plaque or stenosis.
IMPRESSION: 1. Coronary artery calcium score 2.6 Agatston units. This places the
patient in the 54th percentile, suggesting intermediate risk for
future cardiac events.

2.  Possible small PFO.

3.  No obstructive CAD.

Prince Sibanda Cishe

*** End of Addendum ***
EXAM:
OVER-READ INTERPRETATION  CT CHEST

The following report is an over-read performed by radiologist Dr.
Ginjiro Furu [REDACTED] on 08/10/2020. This
over-read does not include interpretation of cardiac or coronary
anatomy or pathology. The coronary calcium score/coronary CTA
interpretation by the cardiologist is attached.
FINDINGS: Atherosclerotic calcifications in the thoracic aorta. Within the
visualized portions of the thorax there are no suspicious appearing
pulmonary nodules or masses, there is no acute consolidative
airspace disease, no pleural effusions, no pneumothorax and no
lymphadenopathy. Visualized portions of the upper abdomen are
unremarkable. There are no aggressive appearing lytic or blastic
lesions noted in the visualized portions of the skeleton.
IMPRESSION: 1.  Aortic Atherosclerosis (D3BPI-6CZ.Z).

## 2022-05-17 ENCOUNTER — Other Ambulatory Visit: Payer: Self-pay | Admitting: Family Medicine

## 2022-05-17 DIAGNOSIS — Z1231 Encounter for screening mammogram for malignant neoplasm of breast: Secondary | ICD-10-CM

## 2022-07-09 ENCOUNTER — Ambulatory Visit: Payer: BC Managed Care – PPO

## 2022-10-09 ENCOUNTER — Ambulatory Visit
Admission: RE | Admit: 2022-10-09 | Discharge: 2022-10-09 | Disposition: A | Discharge status: Home / Self Care | Payer: BC Managed Care – PPO | Source: Ambulatory Visit | Attending: Family Medicine | Admitting: Family Medicine

## 2022-10-09 DIAGNOSIS — Z1231 Encounter for screening mammogram for malignant neoplasm of breast: Secondary | ICD-10-CM | POA: Diagnosis not present

## 2022-12-20 DIAGNOSIS — E7800 Pure hypercholesterolemia, unspecified: Secondary | ICD-10-CM | POA: Diagnosis not present

## 2022-12-20 DIAGNOSIS — Z23 Encounter for immunization: Secondary | ICD-10-CM | POA: Diagnosis not present

## 2022-12-20 DIAGNOSIS — I10 Essential (primary) hypertension: Secondary | ICD-10-CM | POA: Diagnosis not present

## 2022-12-20 DIAGNOSIS — Z0000 Encounter for general adult medical examination without abnormal findings: Secondary | ICD-10-CM | POA: Diagnosis not present

## 2022-12-20 DIAGNOSIS — I2510 Atherosclerotic heart disease of native coronary artery without angina pectoris: Secondary | ICD-10-CM | POA: Diagnosis not present

## 2022-12-26 ENCOUNTER — Other Ambulatory Visit: Payer: Self-pay | Admitting: Family Medicine

## 2022-12-26 DIAGNOSIS — E2839 Other primary ovarian failure: Secondary | ICD-10-CM

## 2022-12-26 DIAGNOSIS — M8580 Other specified disorders of bone density and structure, unspecified site: Secondary | ICD-10-CM

## 2023-12-11 ENCOUNTER — Other Ambulatory Visit: Payer: Self-pay | Admitting: Family Medicine

## 2023-12-11 DIAGNOSIS — Z1231 Encounter for screening mammogram for malignant neoplasm of breast: Secondary | ICD-10-CM

## 2024-01-01 ENCOUNTER — Ambulatory Visit
Admission: RE | Admit: 2024-01-01 | Discharge: 2024-01-01 | Disposition: A | Discharge status: Home / Self Care | Source: Ambulatory Visit | Attending: Family Medicine | Admitting: Family Medicine

## 2024-01-01 DIAGNOSIS — Z1231 Encounter for screening mammogram for malignant neoplasm of breast: Secondary | ICD-10-CM

## 2024-01-06 ENCOUNTER — Other Ambulatory Visit: Payer: Self-pay | Admitting: Family Medicine

## 2024-01-06 DIAGNOSIS — R928 Other abnormal and inconclusive findings on diagnostic imaging of breast: Secondary | ICD-10-CM

## 2024-01-13 ENCOUNTER — Ambulatory Visit
Admission: RE | Admit: 2024-01-13 | Discharge: 2024-01-13 | Disposition: A | Discharge status: Home / Self Care | Source: Ambulatory Visit | Attending: Family Medicine | Admitting: Family Medicine

## 2024-01-13 ENCOUNTER — Other Ambulatory Visit: Payer: Self-pay | Admitting: Family Medicine

## 2024-01-13 DIAGNOSIS — N6325 Unspecified lump in the left breast, overlapping quadrants: Secondary | ICD-10-CM

## 2024-01-13 DIAGNOSIS — R928 Other abnormal and inconclusive findings on diagnostic imaging of breast: Secondary | ICD-10-CM

## 2024-01-13 HISTORY — PX: BREAST BIOPSY: SHX20

## 2024-01-14 LAB — SURGICAL PATHOLOGY

## 2024-01-15 ENCOUNTER — Telehealth: Payer: Self-pay | Admitting: *Deleted

## 2024-01-15 NOTE — Telephone Encounter (Signed)
 Spoke to patient to confirm upcoming afternoon [facility] clinic appointment on 10/1, paperwork will be sent via email.  Gave location and time, also informed patient that the surgeon's office would be calling as well to get information from them similar to the packet that they will be receiving so make sure to do both.  Reminded patient that all providers will be coming to the clinic to see them HERE and if they had any questions to not hesitate to reach back out to myself or their navigators.

## 2024-01-19 ENCOUNTER — Encounter: Payer: Self-pay | Admitting: *Deleted

## 2024-01-19 DIAGNOSIS — Z171 Estrogen receptor negative status [ER-]: Secondary | ICD-10-CM | POA: Insufficient documentation

## 2024-01-19 DIAGNOSIS — Z170 Estrogen receptor positive status [ER+]: Secondary | ICD-10-CM | POA: Insufficient documentation

## 2024-01-20 ENCOUNTER — Encounter: Payer: Self-pay | Admitting: *Deleted

## 2024-01-20 NOTE — Progress Notes (Signed)
 Radiation Oncology         [phone] ________________________________  Initial Outpatient Consultation  Name: Amber Ross MRN: [id number]  Date: 01/21/2024  DOB: 28-Jun-1955  RR:Dyjt, Joen, MD  Curvin Deward MOULD, MD   REFERRING PHYSICIAN: Curvin Deward III, MD  DIAGNOSIS: No diagnosis found.   Cancer Staging  No matching staging information was found for the patient.   Stage *** Left Breast UIQ, Invasive Ductal Carcinoma, ER+ / PR- / Her2 equivocal (FISH pending), Grade 3  History of left breast cancer diagnosed in 2001 : s/p lumpectomy and XRT  - Later underwent right breast reduction and left breast mastopexy in 2020 under Dr. Lowery   CHIEF COMPLAINT: Here to discuss management of left breast cancer  HISTORY OF PRESENT ILLNESS::Amber Ross is a 68 y.o. female who presented with a left breast abnormality on the following imaging: bilateral screening mammogram on the date of 01/01/24. No symptoms, if any, were reported at that time. She then presented for a left breast diagnostic mammogram and left breast ultrasound on 01/13/24 which further revealed: a suspicious 2.2 cm mass in the 9 o'clock left breast, located 3 cmfn. No evidence of left axillary lymphadenopathy was demonstrated.    Biopsy of the 9 o'clock left breast on the date of 01/13/24 showed grade 3 invasive ductal carcinoma measuring 1 cm in the greatest linear extent of the sample with extensive necrosis; negative for LVI.  ER status: 85% positive with weak to moderate staining intensity; PR status 0% negative; Proliferation marker Ki67 at 95%; Her2 status equivocal (FISH pending); Grade 3. No lymph nodes were examined.    ***  PREVIOUS RADIATION THERAPY: Yes, History of left breast cancer diagnosed in 2001 : s/p lumpectomy and adjuvant radiation therapy  - Later underwent right breast reduction and left breast mastopexy in 2020 under Dr. Lowery    PAST MEDICAL HISTORY:  has a past medical  history of ADHD, Agatston coronary artery calcium  score less than 100, Aortic atherosclerosis, Atrophic vaginitis, Breast cancer (HCC), Cough, COVID (05/17/2019), Depression, Elevated cholesterol, Elevated cholesterol, Estrogen deficiency, Gastric reflux, Hypertension, OSA on CPAP, Osteopenia, Personal history of radiation therapy, PFO (patent foramen ovale), Psoriasis, and SOB (shortness of breath).    PAST SURGICAL HISTORY: Past Surgical History:  Procedure Laterality Date   BREAST BIOPSY     BREAST BIOPSY Left 01/13/2024   US  LT BREAST BX W LOC DEV 1ST LESION IMG BX SPEC US  GUIDE 01/13/2024 [facility] MAMMOGRAPHY   BREAST LUMPECTOMY Left    BREAST REDUCTION SURGERY Right 09/28/2018   Procedure: MAMMARY REDUCTION  (BREAST);  Surgeon: Lowery Estefana RAMAN, DO;  Location: [facility];  Service: Plastics;  Laterality: Right;   CESAREAN SECTION     FACIAL COSMETIC SURGERY     MASTOPEXY Left 09/28/2018   Procedure: MASTOPEXY;  Surgeon: Lowery Estefana RAMAN, DO;  Location: [facility];  Service: Plastics;  Laterality: Left;   REDUCTION MAMMAPLASTY Bilateral     FAMILY HISTORY: family history is not on file.  SOCIAL HISTORY:  reports that she quit smoking about 34 years ago. Her smoking use included cigarettes. She started smoking about 49 years ago. She has a 3.8 pack-year smoking history. She has never used smokeless tobacco. She reports current alcohol use. She reports that she does not use drugs.  ALLERGIES: Patient has no known allergies.  MEDICATIONS:  Current Outpatient Medications  Medication Sig Dispense Refill   amphetamine-dextroamphetamine (ADDERALL) 10 MG tablet Take 2 tablets by mouth  in the morning, and 1 tablet by mouth in the evening.     atorvastatin  (LIPITOR) 40 MG tablet Take 1 tablet (40 mg total) by mouth daily. 30 tablet 0   Cholecalciferol (VITAMIN D3) 125 MCG (5000 UT) CAPS Take by mouth.     lisinopril-hydrochlorothiazide (ZESTORETIC) 20-12.5  MG tablet Take 1 tablet by mouth daily.     metoprolol  tartrate (LOPRESSOR ) 100 MG tablet Tale 1 tablet (100 mg total) two hours prior to CT scan 1 tablet 0   Multiple Vitamins-Minerals (HAIR/SKIN/NAILS) CAPS Take by mouth.     omeprazole (PRILOSEC) 20 MG capsule Take 20 mg by mouth daily.     venlafaxine XR (EFFEXOR-XR) 150 MG 24 hr capsule 1 capsule with food     vitamin B-12 (CYANOCOBALAMIN) 1000 MCG tablet Take 1,000 mcg by mouth daily.     No current facility-administered medications for this encounter.    REVIEW OF SYSTEMS: As above in HPI.   PHYSICAL EXAM:  vitals were not taken for this visit.   General: Alert and oriented, in no acute distress HEENT: Head is normocephalic. Extraocular movements are intact.  Heart: Regular in rate and rhythm with no murmurs, rubs, or gallops. Chest: Clear to auscultation bilaterally, with no rhonchi, wheezes, or rales. Abdomen: Soft, nontender, nondistended, with no rigidity or guarding. Extremities: No cyanosis or edema. Skin: No concerning lesions. Musculoskeletal: symmetric strength and muscle tone throughout. Neurologic: Cranial nerves II through XII are grossly intact. No obvious focalities. Speech is fluent. Coordination is intact. Psychiatric: Judgment and insight are intact. Affect is appropriate. Breasts: *** . No other palpable masses appreciated in the breasts or axillae *** .    ECOG = ***  0 - Asymptomatic (Fully active, able to carry on all predisease activities without restriction)  1 - Symptomatic but completely ambulatory (Restricted in physically strenuous activity but ambulatory and able to carry out work of a light or sedentary nature. For example, light housework, office work)  2 - Symptomatic, <50% in bed during the day (Ambulatory and capable of all self care but unable to carry out any work activities. Up and about more than 50% of waking hours)  3 - Symptomatic, >50% in bed, but not bedbound (Capable of only limited  self-care, confined to bed or chair 50% or more of waking hours)  4 - Bedbound (Completely disabled. Cannot carry on any self-care. Totally confined to bed or chair)  5 - Death   Raylene MM, Creech RH, Tormey DC, et al. [phone]). Toxicity and response criteria of the [facility]. Am. DOROTHA Bridges. Oncol. 5 (6): 649-55   LABORATORY DATA:   CBC    Component Value Date/Time   WBC 6.7 08/10/2015 0935   RBC 5.00 08/10/2015 0935   HGB 14.6 08/10/2015 0935   HCT 41.5 08/10/2015 0935   MCV 83.0 08/10/2015 0935   MCH 29.1 08/10/2015 0935   MCHC 35.1 08/10/2015 0935    CMP     Component Value Date/Time   NA 139 08/08/2020 1257   K 3.8 08/08/2020 1257   CL 99 08/08/2020 1257   CO2 24 08/08/2020 1257   GLUCOSE 115 (H) 08/08/2020 1257   BUN 15 08/08/2020 1257   CREATININE 0.90 08/08/2020 1257   CALCIUM  9.9 08/08/2020 1257   ALT 16 03/06/2021 0735   EGFR 71 08/08/2020 1257      RADIOGRAPHY: US  LT BREAST BX W LOC DEV 1ST LESION IMG BX SPEC US  GUIDE Addendum Date: 01/14/2024 ADDENDUM REPORT: 01/14/2024  14:52 ADDENDUM: PATHOLOGY revealed: 1. Breast, left, needle core biopsy, 9:00 3 cmfn - INVASIVE DUCTAL CARCINOMA- OVERALL GRADE: 3 - LYMPHOVASCULAR INVASION: NOT IDENTIFIED- CANCER LENGTH: 1 CM- CALCIFICATIONS: NOT IDENTIFIED- OTHER FINDINGS: EXTENSIVE NECROSIS. Pathology results are CONCORDANT with imaging findings, per Dr. Chyrl Phi. Pathology results and recommendations were discussed with patient via telephone on 01/14/24 by Rock Hover RN. Patient reported biopsy site doing well with no adverse symptoms, and only slight tenderness at the site. Post biopsy care instructions were reviewed, questions were answered and my direct phone number was provided. Patient was instructed to call [facility] for any additional questions or concerns related to biopsy site. RECOMMENDATION: 1. Surgical and oncological consultation. Patient was referred to the [facility] with appointment on 01/21/2024. Pathology results reported by Rock Hover RN on 01/14/2024. Electronically Signed   By: Reyes Phi M.D.   On: 01/14/2024 14:52   Result Date: 01/14/2024 CLINICAL DATA:  68 year old female presents for ultrasound-guided biopsy of INNER LEFT breast mass. EXAM: ULTRASOUND GUIDED LEFT BREAST CORE NEEDLE BIOPSY COMPARISON:  Previous exam(s). PROCEDURE: I met with the patient and we discussed the procedure of ultrasound-guided biopsy, including benefits and alternatives. We discussed the high likelihood of a successful procedure. We discussed the risks of the procedure, including infection, bleeding, tissue injury, clip migration, and inadequate sampling. Informed written consent was given. The usual time-out protocol was performed immediately prior to the procedure. Using sterile technique and 1% Lidocaine  with and without epinephrine  as local anesthetic, under direct ultrasound visualization, a 12 gauge spring-loaded device was used to perform biopsy of the 2.2 cm 9 o'clock position LEFT breast mass using a MEDIAL approach. At the conclusion of the procedure a HEART shaped tissue marker clip was deployed into the biopsy cavity. Follow up 2 view mammogram was performed and dictated separately. IMPRESSION: Ultrasound guided biopsy of 2.2 cm INNER LEFT breast mass. No apparent complications. Electronically Signed: By: Reyes Phi M.D. On: 01/13/2024 15:13   MM CLIP PLACEMENT LEFT Result Date: 01/13/2024 CLINICAL DATA:  Evaluate HEART biopsy clip placement following ultrasound-guided LEFT breast biopsy. EXAM: 3D DIAGNOSTIC LEFT MAMMOGRAM POST ULTRASOUND BIOPSY COMPARISON:  Previous exam(s). ACR Breast Density Category b: There are scattered areas of fibroglandular density. FINDINGS: 3D Mammographic images were obtained following ultrasound guided biopsy of the 2.2 cm 9 o'clock position LEFT breast mass. The HEART  biopsy marking clip is in expected position at the site of biopsy. IMPRESSION: Appropriate positioning of the HEART shaped biopsy marking clip at the site of biopsy in the INNER LEFT breast. Final Assessment: Post Procedure Mammograms for Marker Placement Electronically Signed   By: Reyes Phi M.D.   On: 01/13/2024 15:20   MM 3D DIAGNOSTIC MAMMOGRAM UNILATERAL LEFT BREAST Result Date: 01/13/2024 CLINICAL DATA:  LEFT breast callback. History of a LEFT lumpectomy in 2001 as well as breast reduction EXAM: DIGITAL DIAGNOSTIC UNILATERAL LEFT MAMMOGRAM WITH TOMOSYNTHESIS AND CAD; ULTRASOUND LEFT BREAST LIMITED TECHNIQUE: Left digital diagnostic mammography and breast tomosynthesis was performed. The images were evaluated with computer-aided detection. ; Targeted ultrasound examination of the left breast was performed. COMPARISON:  Previous exam(s). ACR Breast Density Category a: The breasts are almost entirely fatty. FINDINGS: Spot compression tomosynthesis views demonstrate a persistent oval mass with irregular margins in the LEFT inner breast at anterior depth. Targeted ultrasound was performed of the LEFT inner breast. At 9 o'clock 3 cm from the nipple, there is an  oval hypoechoic mass with irregular margins. It measures 22 x 19 x 19 mm. This corresponds to the mass of mammographic concern. Targeted ultrasound was performed of the LEFT axilla. No suspicious LEFT axillary lymph nodes are visualized. IMPRESSION: 1. There is a suspicious 22 mm mass in the LEFT inner breast. Recommend ultrasound-guided biopsy for definitive characterization. 2. No suspicious LEFT axillary adenopathy. RECOMMENDATION: LEFT breast ultrasound-guided biopsy x1 I have discussed the findings and recommendations with the patient. The biopsy procedure was discussed with the patient and questions were answered. Patient expressed their understanding of the biopsy recommendation. Patient will be scheduled for biopsy today. Ordering provider will  be notified. If applicable, a reminder letter will be sent to the patient regarding the next appointment. BI-RADS CATEGORY  5: Highly suggestive of malignancy. Electronically Signed   By: Corean Salter M.D.   On: 01/13/2024 10:34   US  LIMITED ULTRASOUND INCLUDING AXILLA LEFT BREAST  Result Date: 01/13/2024 CLINICAL DATA:  LEFT breast callback. History of a LEFT lumpectomy in 2001 as well as breast reduction EXAM: DIGITAL DIAGNOSTIC UNILATERAL LEFT MAMMOGRAM WITH TOMOSYNTHESIS AND CAD; ULTRASOUND LEFT BREAST LIMITED TECHNIQUE: Left digital diagnostic mammography and breast tomosynthesis was performed. The images were evaluated with computer-aided detection. ; Targeted ultrasound examination of the left breast was performed. COMPARISON:  Previous exam(s). ACR Breast Density Category a: The breasts are almost entirely fatty. FINDINGS: Spot compression tomosynthesis views demonstrate a persistent oval mass with irregular margins in the LEFT inner breast at anterior depth. Targeted ultrasound was performed of the LEFT inner breast. At 9 o'clock 3 cm from the nipple, there is an oval hypoechoic mass with irregular margins. It measures 22 x 19 x 19 mm. This corresponds to the mass of mammographic concern. Targeted ultrasound was performed of the LEFT axilla. No suspicious LEFT axillary lymph nodes are visualized. IMPRESSION: 1. There is a suspicious 22 mm mass in the LEFT inner breast. Recommend ultrasound-guided biopsy for definitive characterization. 2. No suspicious LEFT axillary adenopathy. RECOMMENDATION: LEFT breast ultrasound-guided biopsy x1 I have discussed the findings and recommendations with the patient. The biopsy procedure was discussed with the patient and questions were answered. Patient expressed their understanding of the biopsy recommendation. Patient will be scheduled for biopsy today. Ordering provider will be notified. If applicable, a reminder letter will be sent to the patient regarding the  next appointment. BI-RADS CATEGORY  5: Highly suggestive of malignancy. Electronically Signed   By: Corean Salter M.D.   On: 01/13/2024 10:34   MM 3D SCREENING MAMMOGRAM BILATERAL BREAST Result Date: 01/05/2024 CLINICAL DATA:  Screening. EXAM: DIGITAL SCREENING BILATERAL MAMMOGRAM WITH TOMOSYNTHESIS AND CAD TECHNIQUE: Bilateral screening digital craniocaudal and mediolateral oblique mammograms were obtained. Bilateral screening digital breast tomosynthesis was performed. The images were evaluated with computer-aided detection. COMPARISON:  Previous exam(s). ACR Breast Density Category a: The breasts are almost entirely fatty. FINDINGS: In the left breast, a possible mass warrants further evaluation. In the right breast, no findings suspicious for malignancy. IMPRESSION: Further evaluation is suggested for a possible mass in the left breast. RECOMMENDATION: Diagnostic mammogram and possibly ultrasound of the left breast. (Code:[id number]) The patient will be contacted regarding the findings, and additional imaging will be scheduled. BI-RADS CATEGORY  0: Incomplete: Need additional imaging evaluation. Electronically Signed   By: Dina  Arceo M.D.   On: 01/05/2024 11:28      IMPRESSION/PLAN: ***   It was a pleasure meeting the patient today. We discussed the risks, benefits, and side effects of radiotherapy.  I recommend radiotherapy to the *** to reduce her risk of locoregional recurrence by 2/3.  We discussed that radiation would take approximately *** weeks to complete and that I would give the patient a few weeks to heal following surgery before starting treatment planning. *** If chemotherapy were to be given, this would precede radiotherapy. We spoke about acute effects including skin irritation and fatigue as well as much less common late effects including internal organ injury or irritation. We spoke about the latest technology that is used to minimize the risk of late effects for patients undergoing  radiotherapy to the breast or chest wall. No guarantees of treatment were given. The patient is enthusiastic about proceeding with treatment. I look forward to participating in the patient's care.  I will await her referral back to me for postoperative follow-up and eventual CT simulation/treatment planning.  On date of service, in total, I spent *** minutes on this encounter. Patient was seen in person.   __________________________________________   Lauraine Golden, MD  This document serves as a record of services personally performed by Lauraine Golden, MD. It was created on her behalf by Dorthy Fuse, a trained medical scribe. The creation of this record is based on the scribe's personal observations and the provider's statements to them. This document has been checked and approved by the attending provider.

## 2024-01-21 ENCOUNTER — Encounter: Payer: Self-pay | Admitting: *Deleted

## 2024-01-21 ENCOUNTER — Ambulatory Visit: Payer: Self-pay | Admitting: General Surgery

## 2024-01-21 ENCOUNTER — Other Ambulatory Visit: Payer: Self-pay

## 2024-01-21 ENCOUNTER — Ambulatory Visit
Admission: RE | Admit: 2024-01-21 | Discharge: 2024-01-21 | Disposition: A | Discharge status: Home / Self Care | Source: Ambulatory Visit | Attending: Radiation Oncology | Admitting: Radiation Oncology

## 2024-01-21 ENCOUNTER — Inpatient Hospital Stay (HOSPITAL_BASED_OUTPATIENT_CLINIC_OR_DEPARTMENT_OTHER): Admitting: Genetic Counselor

## 2024-01-21 ENCOUNTER — Encounter: Payer: Self-pay | Admitting: Physical Therapy

## 2024-01-21 ENCOUNTER — Inpatient Hospital Stay: Attending: Hematology and Oncology

## 2024-01-21 ENCOUNTER — Ambulatory Visit: Attending: General Surgery | Admitting: Physical Therapy

## 2024-01-21 ENCOUNTER — Inpatient Hospital Stay (HOSPITAL_BASED_OUTPATIENT_CLINIC_OR_DEPARTMENT_OTHER): Admitting: Hematology and Oncology

## 2024-01-21 VITALS — BP 146/89 | HR 92 | Temp 97.7°F | Resp 18 | Ht 67.0 in | Wt 165.2 lb

## 2024-01-21 DIAGNOSIS — Z803 Family history of malignant neoplasm of breast: Secondary | ICD-10-CM

## 2024-01-21 DIAGNOSIS — Z170 Estrogen receptor positive status [ER+]: Secondary | ICD-10-CM | POA: Insufficient documentation

## 2024-01-21 DIAGNOSIS — C50212 Malignant neoplasm of upper-inner quadrant of left female breast: Secondary | ICD-10-CM | POA: Insufficient documentation

## 2024-01-21 DIAGNOSIS — Z853 Personal history of malignant neoplasm of breast: Secondary | ICD-10-CM | POA: Diagnosis not present

## 2024-01-21 DIAGNOSIS — R293 Abnormal posture: Secondary | ICD-10-CM | POA: Insufficient documentation

## 2024-01-21 LAB — CBC WITH DIFFERENTIAL (CANCER CENTER ONLY)
Abs Immature Granulocytes: 0.01 K/uL (ref 0.00–0.07)
Basophils Absolute: 0 K/uL (ref 0.0–0.1)
Basophils Relative: 0 %
Eosinophils Absolute: 0 K/uL (ref 0.0–0.5)
Eosinophils Relative: 0 %
HCT: 41.1 % (ref 36.0–46.0)
Hemoglobin: 13.5 g/dL (ref 12.0–15.0)
Immature Granulocytes: 0 %
Lymphocytes Relative: 28 %
Lymphs Abs: 1.9 K/uL (ref 0.7–4.0)
MCH: 28.7 pg (ref 26.0–34.0)
MCHC: 32.8 g/dL (ref 30.0–36.0)
MCV: 87.3 fL (ref 80.0–100.0)
Monocytes Absolute: 0.5 K/uL (ref 0.1–1.0)
Monocytes Relative: 8 %
Neutro Abs: 4.2 K/uL (ref 1.7–7.7)
Neutrophils Relative %: 64 %
Platelet Count: 160 K/uL (ref 150–400)
RBC: 4.71 MIL/uL (ref 3.87–5.11)
RDW: 12.2 % (ref 11.5–15.5)
WBC Count: 6.7 K/uL (ref 4.0–10.5)
nRBC: 0 % (ref 0.0–0.2)

## 2024-01-21 LAB — CMP (CANCER CENTER ONLY)
ALT: 16 U/L (ref 0–44)
AST: 10 U/L — ABNORMAL LOW (ref 15–41)
Albumin: 4.6 g/dL (ref 3.5–5.0)
Alkaline Phosphatase: 64 U/L (ref 38–126)
Anion gap: 5 (ref 5–15)
BUN: 13 mg/dL (ref 8–23)
CO2: 32 mmol/L (ref 22–32)
Calcium: 10.1 mg/dL (ref 8.9–10.3)
Chloride: 101 mmol/L (ref 98–111)
Creatinine: 0.86 mg/dL (ref 0.44–1.00)
GFR, Estimated: >60 mL/min (ref >60)
Glucose, Bld: 98 mg/dL (ref 70–99)
Potassium: 3.9 mmol/L (ref 3.5–5.1)
Sodium: 138 mmol/L (ref 135–145)
Total Bilirubin: 0.8 mg/dL (ref 0.0–1.2)
Total Protein: 7.6 g/dL (ref 6.5–8.1)

## 2024-01-21 LAB — GENETIC SCREENING ORDER

## 2024-01-21 NOTE — Assessment & Plan Note (Signed)
 01/13/2024:History of left breast cancer: 2001: Lumpectomy and radiation Screening mammogram detected left breast mass by ultrasound 2.2 cm at 9 o'clock position, axilla negative biopsy: Grade 3 IDC ER 85%, PR 0%, HER2 equivalent FISH pending, Ki-67 95%  Pathology and radiology counseling:Discussed with the patient, the details of pathology including the type of breast cancer,the clinical staging, the significance of ER, PR and HER-2/neu receptors and the implications for treatment. After reviewing the pathology in detail, we proceeded to discuss the different treatment options between surgery, radiation, chemotherapy, antiestrogen therapies.  Recommendations: 1.  Mastectomy with sentinel lymph node biopsy 2. Oncotype DX testing to determine if chemotherapy would be of any benefit followed by 3.  Since she had prior radiation, probably no role of additional adjuvant radiation 4. Adjuvant antiestrogen therapy  Oncotype counseling: I discussed Oncotype DX test. I explained to the patient that this is a 21 gene panel to evaluate patient tumors DNA to calculate recurrence score. This would help determine whether patient has high risk or low risk breast cancer. She understands that if her tumor was found to be high risk, she would benefit from systemic chemotherapy. If low risk, no need of chemotherapy.  Return to clinic after surgery to discuss final pathology report and then determine if Oncotype DX testing will need to be sent.

## 2024-01-21 NOTE — Therapy (Signed)
 OUTPATIENT PHYSICAL THERAPY BREAST CANCER BASELINE EVALUATION   Patient Name: Amber Ross MRN: [id number] DOB:07-Apr-1956, 68 y.o., female Today's Date: 01/21/2024  END OF SESSION:  PT End of Session - 01/21/24 1501     Visit Number 1    Number of Visits 2    Date for Recertification  03/17/24    PT Start Time 1402    PT Stop Time 1423   Also saw pt from [id number] and 1343-1346 for a total of 32 min   PT Time Calculation (min) 21 min    Activity Tolerance Patient tolerated treatment well    Behavior During Therapy [facility] for tasks assessed/performed          Past Medical History:  Diagnosis Date   ADHD    Agatston coronary artery calcium  score less than 100    coronary CTA showed minimal plaque in the pLAD 07/2020   Aortic atherosclerosis    Atrophic vaginitis    Breast cancer (HCC)    left breast cancer 20 yrs ago   Cough    COVID 05/17/2019   Depression    Elevated cholesterol    Elevated cholesterol    Estrogen deficiency    Gastric reflux    Hypertension    OSA on CPAP    Osteopenia    Personal history of radiation therapy    PFO (patent foramen ovale)    small and noted on cardiac CTA   Psoriasis    SOB (shortness of breath)    Past Surgical History:  Procedure Laterality Date   BREAST BIOPSY     BREAST BIOPSY Left 01/13/2024   US  LT BREAST BX W LOC DEV 1ST LESION IMG BX SPEC US  GUIDE 01/13/2024 [facility] MAMMOGRAPHY   BREAST LUMPECTOMY Left    BREAST REDUCTION SURGERY Right 09/28/2018   Procedure: MAMMARY REDUCTION  (BREAST);  Surgeon: Amber Estefana RAMAN, DO;  Location: [facility];  Service: Plastics;  Laterality: Right;   CESAREAN SECTION     FACIAL COSMETIC SURGERY     MASTOPEXY Left 09/28/2018   Procedure: MASTOPEXY;  Surgeon: Amber Estefana RAMAN, DO;  Location: [facility];  Service: Plastics;  Laterality: Left;   REDUCTION MAMMAPLASTY Bilateral    Patient Active Problem List   Diagnosis Date Noted   Malignant  neoplasm of upper-inner quadrant of left breast in female, estrogen receptor positive (HCC) 01/19/2024   Aortic atherosclerosis    PFO (patent foramen ovale)    Agatston coronary artery calcium  score less than 100    Postoperative breast asymmetry 06/05/2018   History of breast cancer in female 08/10/2015   Upper airway cough syndrome 11/17/2013    REFERRING PROVIDER: Dr. Deward Null  REFERRING DIAG: Left breast cancer  THERAPY DIAG:  Malignant neoplasm of upper-inner quadrant of left breast in female, estrogen receptor positive (HCC)  Abnormal posture  Rationale for Evaluation and Treatment: Rehabilitation  ONSET DATE: 01/13/2024  SUBJECTIVE:  SUBJECTIVE STATEMENT: Patient reports she is here today to be seen by her medical team for her newly diagnosed left breast cancer.   PERTINENT HISTORY:  Patient was diagnosed on 01/13/2024 with left grade 3 invasive ductal carcinoma breast cancer. It measures 2.2 cm and is located in the upper inner quadrant. It is weakly ER positive, PR and HER2 negative with a Ki67 of 95%. She had left breast cancer in 2001 which was DCIS and treated with a lumpectomy and radiation.  PATIENT GOALS:   reduce lymphedema risk and learn post op HEP.   PAIN:  Are you having pain? No  PRECAUTIONS: Active CA   RED FLAGS: None   HAND DOMINANCE: right  WEIGHT BEARING RESTRICTIONS: No  FALLS:  Has patient fallen in last 6 months? 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  COGNITION: Overall cognitive status: Within functional limits for tasks assessed    POSTURE:  Forward head and rounded shoulders posture  UPPER EXTREMITY AROM/PROM:  A/PROM RIGHT   eval   Shoulder extension 62  Shoulder flexion 163  Shoulder abduction 169  Shoulder internal rotation 64  Shoulder external rotation 90    (Blank rows = not tested)  A/PROM LEFT   eval  Shoulder extension 67  Shoulder flexion 145  Shoulder abduction 167  Shoulder internal rotation 69  Shoulder external rotation 76    (Blank rows = not tested)  CERVICAL AROM: All within normal limits  UPPER EXTREMITY STRENGTH: WNL  LYMPHEDEMA ASSESSMENTS (in cm):   LANDMARK RIGHT   eval  10 cm proximal to olecranon process 23.9  Olecranon process 22.9  10 cm proximal to ulnar styloid process 17.7  Just proximal to ulnar styloid process 14.4  Across hand at thumb web space 17.4  At base of 2nd digit 5.9  (Blank rows = not tested)  LANDMARK LEFT   eval  10 cm proximal to olecranon process 24.3  Olecranon process 22.8  10 cm proximal to ulnar styloid process 16.5  Just proximal to ulnar styloid process 14.2  Across hand at thumb web space 16.6  At base of 2nd digit 5.5  (Blank rows = not tested)  L-DEX LYMPHEDEMA SCREENING:  The patient was assessed using the L-Dex machine today to produce a lymphedema index baseline score. The patient will be reassessed on a regular basis (typically every 3 months) to obtain new L-Dex scores. If the score is > 6.5 points away from his/her baseline score indicating onset of subclinical lymphedema, it will be recommended to wear a compression garment for 4 weeks, 12 hours per day and then be reassessed. If the score continues to be > 6.5 points from baseline at reassessment, we will initiate lymphedema treatment. Assessing in this manner has a 95% rate of preventing clinically significant lymphedema.   L-DEX FLOWSHEETS - 01/21/24 1500       L-DEX  LYMPHEDEMA SCREENING   Measurement Type Unilateral    L-DEX MEASUREMENT EXTREMITY Upper Extremity    POSITION  Standing    DOMINANT SIDE Right    At Risk Side Left    BASELINE SCORE (UNILATERAL) 12.5   Repeated test twice but was out of range both times         QUICK  DASH SURVEY:  Amber Ross - 01/21/24 0001     Open a tight or new jar Mild difficulty    Do heavy household chores (wash walls, wash floors) Mild difficulty    Carry a shopping bag or briefcase No difficulty    Wash your back No difficulty    Use a knife to cut food No difficulty    Recreational activities in which you take some force or impact through your arm, shoulder, or hand (golf, hammering, tennis) Mild difficulty    During the past week, to what extent has your arm, shoulder or hand problem interfered with your normal social activities with family, friends, neighbors, or groups? Slightly    During the past week, to what extent has your arm, shoulder or hand problem limited your work or other regular daily activities Slightly    Arm, shoulder, or hand pain. Mild    Tingling (pins and needles) in your arm, shoulder, or hand None    Difficulty Sleeping No difficulty    DASH Score 13.64 %           PATIENT EDUCATION:  Education details: Time spent educating patient on aspects of self-care to maximize post op recovery. Patient was educated on where and how to get a post op compression bra to use to reduce post op edema. Patient was also educated on the use of SOZO screenings and surveillance principles for early identification of lymphedema onset. She was instructed to use the post op pillow in the axilla for pressure and pain relief. Patient educated on lymphedema risk reduction and post op shoulder/posture HEP. Person educated: Patient Education method: Explanation, Demonstration, Handout Education comprehension: Patient verbalized understanding and returned demonstration  HOME EXERCISE PROGRAM: Patient was  instructed today in a home exercise program today for post op shoulder range of motion. These included active assist shoulder flexion in sitting, scapular retraction, wall walking with shoulder abduction, and hands behind head external rotation.  She was encouraged to do these twice a day, holding 3 seconds and repeating 5 times when permitted by her physician.   ASSESSMENT:  CLINICAL IMPRESSION: Patient was diagnosed on 01/13/2024 with left grade 3 invasive ductal carcinoma breast cancer. It measures 2.2 cm and is located in the upper inner quadrant. It is weakly ER positive, PR and HER2 negative with a Ki67 of 95%. She had left breast cancer in 2001 which was DCIS and treated with a lumpectomy and radiation.Her multidisciplinary medical team met prior to her assessments to determine a recommended treatment plan. She is planning to have a left mastectomy and sentinel node biopsy with possible reconstruction, and possible anti-estrogen therapy depending on repeat prognostic results. She will benefit from a post op PT reassessment to determine needs and from L-Dex screens every 3 months for 2 years to detect subclinical lymphedema.  Pt will benefit from skilled therapeutic intervention to improve on the following deficits: Decreased knowledge of precautions, impaired UE functional use, pain, decreased ROM, postural dysfunction.   PT treatment/interventions: ADL/self-care home management, pt/family education, therapeutic exercise  REHAB POTENTIAL: Excellent  CLINICAL DECISION MAKING: Stable/uncomplicated  EVALUATION COMPLEXITY: Low   GOALS: Goals reviewed with patient? YES  LONG TERM GOALS: (STG=LTG)    Name Target Date Goal status  1 Pt will be able to verbalize understanding of pertinent lymphedema risk reduction practices relevant to her dx specifically related to skin care.  Baseline:  No knowledge 01/21/2024 Achieved at eval  2 Pt will be able to return demo and/or verbalize  understanding  of the post op HEP related to regaining shoulder ROM. Baseline:  No knowledge 01/21/2024 Achieved at eval  3 Pt will be able to verbalize understanding of the importance of viewing the post op After Breast CA Class video for further lymphedema risk reduction education and therapeutic exercise.  Baseline:  No knowledge 01/21/2024 Achieved at eval  4 Pt will demo she has regained full shoulder ROM and function post operatively compared to baselines.  Baseline: See objective measurements taken today. 03/17/2024     PLAN:  PT FREQUENCY/DURATION: EVAL and 1 follow up appointment.   PLAN FOR NEXT SESSION: will reassess 3-4 weeks post op to determine needs.   Patient will follow up at outpatient cancer rehab 3-4 weeks following surgery.  If the patient requires physical therapy at that time, a specific plan will be dictated and sent to the referring physician for approval. The patient was educated today on appropriate basic range of motion exercises to begin post operatively and the importance of viewing the After Breast Cancer class video following surgery.  Patient was educated today on lymphedema risk reduction practices as it pertains to recommendations that will benefit the patient immediately following surgery.  She verbalized good understanding.    Physical Therapy Information for After Breast Cancer Surgery/Treatment:  Lymphedema is a swelling condition that you may be at risk for in your arm if you have lymph nodes removed from the armpit area.  After a sentinel node biopsy, the risk is approximately 5-9% and is higher after an axillary node dissection.  There is treatment available for this condition and it is not life-threatening.  Contact your physician or physical therapist with concerns. You may begin the 4 shoulder/posture exercises (see additional sheet) when permitted by your physician (typically a week after surgery).  If you have drains, you may need to wait until those are removed  before beginning range of motion exercises.  A general recommendation is to not lift your arms above shoulder height until drains are removed.  These exercises should be done to your tolerance and gently.  This is not a no pain/no gain type of recovery so listen to your body and stretch into the range of motion that you can tolerate, stopping if you have pain.  If you are having immediate reconstruction, ask your plastic surgeon about doing exercises as he or she may want you to wait. We encourage you to view the After Breast Cancer class video following surgery.  You will learn information related to lymphedema risk, prevention and treatment and additional exercises to regain mobility following surgery.   While undergoing any medical procedure or treatment, try to avoid blood pressure being taken or needle sticks from occurring on the arm on the side of cancer.   This recommendation begins after surgery and continues for the rest of your life.  This may help reduce your risk of getting lymphedema (swelling in your arm). An excellent resource for those seeking information on lymphedema is the National Lymphedema Network's web site. It can be accessed at www.lymphnet.org If you notice swelling in your hand, arm or breast at any time following surgery (even if it is many years from now), please contact your doctor or physical therapist to discuss this.  Lymphedema can be treated at any time but it is easier for you if it is treated early on.  If you feel like your shoulder motion is not returning to normal in a reasonable amount of time, please contact your  surgeon or physical therapist.  [facility] [phone]. [address], [address], [place] [address]  [facility] CLASS After Breast Cancer Class  After Breast Cancer Class is a specially designed exercise class video to assist you in a safe recover after having breast cancer surgery.  In this video you will learn how to  get back to full function whether your drains were just removed or if you had surgery a month ago. The video can be viewed on this page: [email or web address].boyd-meyer.org/ or on YouTube here: https://youtu.[email or web address].  Class Goals  Understand specific stretches to improve the flexibility of you chest and shoulder. Learn ways to safely strengthen your upper body and improve your posture. Understand the warning signs of infection and why you may be at risk for an arm infection. Learn about Lymphedema and prevention.  ** You do not need to view this video until after surgery.  Drains should be removed to participate in the recommended exercises on the video.  Patient was instructed today in a home exercise program today for post op shoulder range of motion. These included active assist shoulder flexion in sitting, scapular retraction, wall walking with shoulder abduction, and hands behind head external rotation.  She was encouraged to do these twice a day, holding 3 seconds and repeating 5 times when permitted by her physician.  Eward Wonda Sharps, [place] 01/21/24 3:08 PM

## 2024-01-21 NOTE — Progress Notes (Signed)
 [facility] CONSULT NOTE  Patient Care Team: Loreli Kins, MD as PCP - General (Family Medicine) Shlomo Wilbert SAUNDERS, MD as PCP - Cardiology (Cardiology) Gerome, Devere HERO, RN as Oncology Nurse Navigator Tyree Nanetta SAILOR, RN as Registered Nurse Curvin Deward MOULD, MD as Consulting Physician (General Surgery) Odean Potts, MD as Consulting Physician (Hematology and Oncology) Izell Domino, MD as Attending Physician (Radiation Oncology)  CHIEF COMPLAINTS/PURPOSE OF CONSULTATION:  Newly diagnosed breast cancer  HISTORY OF PRESENTING ILLNESS:    History of Present Illness Amber Ross is a 68 year old female with a history of ductal carcinoma in situ who presents for evaluation of newly diagnosed invasive ductal carcinoma. She is accompanied by her sister.  In 2001, she underwent a lumpectomy and radiation therapy for ductal carcinoma in situ. She did not receive tamoxifen or chemotherapy, and the estrogen receptor status was not disclosed.  A recent routine mammogram led to further evaluation with ultrasounds and biopsies, diagnosing invasive ductal carcinoma. The tumor measures 2.2 centimeters with no lymph node enlargement. It is estrogen receptor positive at 85%, progesterone receptor negative, and HER2 negative. The tumor is grade 3 with a high proliferation index (KI-67) of 95%.  She lives alone and works as a Scientist, research (life sciences), primarily using a Animator. She is not currently exercising regularly but plans to start. She enjoys social activities with friends.     I reviewed her records extensively and collaborated the history with the patient.  SUMMARY OF ONCOLOGIC HISTORY: Oncology History  Malignant neoplasm of upper-inner quadrant of left breast in female, estrogen receptor positive (HCC)  01/13/2024 Initial Diagnosis   History of left breast cancer: 2001: Lumpectomy and radiation Screening mammogram detected left breast mass by ultrasound 2.2  cm at 9 o'clock position, axilla negative biopsy: Grade 3 IDC ER 85%, PR 0%, HER2 equivalent FISH pending, Ki-67 95%   01/21/2024 Cancer Staging   Staging form: Breast, AJCC 8th Edition - Clinical: Stage IIB (cT2, cN0, cM0, G3, ER+, PR-, HER2-) - Signed by Odean Potts, MD on 01/21/2024 Stage prefix: Initial diagnosis Histologic grading system: 3 grade system      MEDICAL HISTORY:  Past Medical History:  Diagnosis Date   ADHD    Agatston coronary artery calcium  score less than 100    coronary CTA showed minimal plaque in the pLAD 07/2020   Aortic atherosclerosis    Atrophic vaginitis    Breast cancer (HCC)    left breast cancer 20 yrs ago   Cough    COVID 05/17/2019   Depression    Elevated cholesterol    Elevated cholesterol    Estrogen deficiency    Gastric reflux    Hypertension    OSA on CPAP    Osteopenia    Personal history of radiation therapy    PFO (patent foramen ovale)    small and noted on cardiac CTA   Psoriasis    SOB (shortness of breath)     SURGICAL HISTORY: Past Surgical History:  Procedure Laterality Date   BREAST BIOPSY     BREAST BIOPSY Left 01/13/2024   US  LT BREAST BX W LOC DEV 1ST LESION IMG BX SPEC US  GUIDE 01/13/2024 [facility] MAMMOGRAPHY   BREAST LUMPECTOMY Left    BREAST REDUCTION SURGERY Right 09/28/2018   Procedure: MAMMARY REDUCTION  (BREAST);  Surgeon: Lowery Estefana RAMAN, DO;  Location: [facility];  Service: Plastics;  Laterality: Right;   CESAREAN SECTION     FACIAL COSMETIC  SURGERY     MASTOPEXY Left 09/28/2018   Procedure: MASTOPEXY;  Surgeon: Lowery Estefana RAMAN, DO;  Location: [facility];  Service: Plastics;  Laterality: Left;   REDUCTION MAMMAPLASTY Bilateral     SOCIAL HISTORY: Social History   Socioeconomic History   Marital status: Divorced    Spouse name: Not on file   Number of children: Not on file   Years of education: Not on file   Highest education level: Not on file  Occupational  History   Occupation: Mortgage loan processor   Tobacco Use   Smoking status: Former    Current packs/day: 0.00    Average packs/day: 0.3 packs/day for 15.0 years (3.8 ttl pk-yrs)    Types: Cigarettes    Start date: 04/22/1974    Quit date: 04/22/1989    Years since quitting: 34.7   Smokeless tobacco: Never  Substance and Sexual Activity   Alcohol use: Yes    Comment: 1-2 times per month   Drug use: No   Sexual activity: Not on file  Other Topics Concern   Not on file  Social History Narrative   Not on file   Social Drivers of Health   Financial Resource Strain: Not on file  Food Insecurity: No Food Insecurity (01/21/2024)   Hunger Vital Sign    Worried About Running Out of Food in the Last Year: Never true    Ran Out of Food in the Last Year: Never true  Transportation Needs: No Transportation Needs (01/21/2024)   PRAPARE - Administrator, Civil Service (Medical): No    Lack of Transportation (Non-Medical): No  Physical Activity: Not on file  Stress: Not on file  Social Connections: Not on file  Intimate Partner Violence: Not At Risk (01/21/2024)   Humiliation, Afraid, Rape, and Kick questionnaire    Fear of Current or Ex-Partner: No    Emotionally Abused: No    Physically Abused: No    Sexually Abused: No    FAMILY HISTORY: Family History  Problem Relation Age of Onset   Breast cancer Neg Hx     ALLERGIES:  has no known allergies.  MEDICATIONS:  Current Outpatient Medications  Medication Sig Dispense Refill   amphetamine-dextroamphetamine (ADDERALL) 10 MG tablet Take 2 tablets by mouth in the morning, and 1 tablet by mouth in the evening.     atorvastatin  (LIPITOR) 40 MG tablet Take 1 tablet (40 mg total) by mouth daily. 30 tablet 0   Cholecalciferol (VITAMIN D3) 125 MCG (5000 UT) CAPS Take by mouth.     lisinopril-hydrochlorothiazide (ZESTORETIC) 20-12.5 MG tablet Take 1 tablet by mouth daily.     metoprolol  tartrate (LOPRESSOR ) 100 MG tablet Tale 1  tablet (100 mg total) two hours prior to CT scan 1 tablet 0   Multiple Vitamins-Minerals (HAIR/SKIN/NAILS) CAPS Take by mouth.     omeprazole (PRILOSEC) 20 MG capsule Take 20 mg by mouth daily.     venlafaxine XR (EFFEXOR-XR) 150 MG 24 hr capsule 1 capsule with food     vitamin B-12 (CYANOCOBALAMIN) 1000 MCG tablet Take 1,000 mcg by mouth daily.     No current facility-administered medications for this visit.    REVIEW OF SYSTEMS:   Constitutional: Denies fevers, chills or abnormal night sweats Breast:  Denies any palpable lumps or discharge All other systems were reviewed with the patient and are negative.  PHYSICAL EXAMINATION: ECOG PERFORMANCE STATUS: 1 - Symptomatic but completely ambulatory  Vitals:   01/21/24 1308  BP: (!) 146/89  Pulse: 92  Resp: 18  Temp: 97.7 F (36.5 C)  SpO2: 98%   Filed Weights   01/21/24 1308  Weight: 165 lb 3.2 oz (74.9 kg)    GENERAL:alert, no distress and comfortable    LABORATORY DATA:  I have reviewed the data as listed Lab Results  Component Value Date   WBC 6.7 01/21/2024   HGB 13.5 01/21/2024   HCT 41.1 01/21/2024   MCV 87.3 01/21/2024   PLT 160 01/21/2024   Lab Results  Component Value Date   NA 138 01/21/2024   K 3.9 01/21/2024   CL 101 01/21/2024   CO2 32 01/21/2024    RADIOGRAPHIC STUDIES: I have personally reviewed the radiological reports and agreed with the findings in the report.  ASSESSMENT AND PLAN:  Malignant neoplasm of upper-inner quadrant of left breast in female, estrogen receptor positive (HCC) 01/13/2024:History of left breast cancer: 2001: Lumpectomy and radiation Screening mammogram detected left breast mass by ultrasound 2.2 cm at 9 o'clock position, axilla negative biopsy: Grade 3 IDC ER 85%, PR 0%, HER2 equivalent FISH pending, Ki-67 95%  Pathology and radiology counseling:Discussed with the patient, the details of pathology including the type of breast cancer,the clinical staging, the significance  of ER, PR and HER-2/neu receptors and the implications for treatment. After reviewing the pathology in detail, we proceeded to discuss the different treatment options between surgery, radiation, chemotherapy, antiestrogen therapies.  Recommendations: 1.  Mastectomy with sentinel lymph node biopsy and repeat breast prognostic panel on the final pathology specimen 2.  If PR positive on final pathology: Oncotype DX testing to determine if chemotherapy would be of any benefit followed by 3.  With mastectomy and prior radiation, probably no role of additional adjuvant radiation 4. Adjuvant antiestrogen therapy if the final pathology is ER positive  Oncotype counseling: I discussed Oncotype DX test. I explained to the patient that this is a 21 gene panel to evaluate patient tumors DNA to calculate recurrence score. This would help determine whether patient has high risk or low risk breast cancer. She understands that if her tumor was found to be high risk, she would benefit from systemic chemotherapy. If low risk, no need of chemotherapy.  Return to clinic after surgery to discuss final pathology report and then determine if Oncotype DX testing will need to be sent.   All questions were answered. The patient knows to call the clinic with any problems, questions or concerns.    Viinay K Noga Fogg, MD 01/21/24

## 2024-01-21 NOTE — Progress Notes (Signed)
 REFERRING PHYSICIAN:  Gudena, Vinay K, MD PROVIDER:  DEWARD GARNETTE NULL, MD MRN: [id number] DOB: 06-07-1955 DATE OF ENCOUNTER: 01/21/2024 Subjective    Chief Complaint: Breast Cancer   History of Present Illness: Amber Ross is a 68 y.o. female who is seen today as an office consultation for evaluation of Breast Cancer   We are asked to see the patient in consultation by Dr. Odean to evaluate her for a new left breast cancer.  The patient is a 68 year old white female who recently went for a routine screening mammogram.  At that time Amber Ross was found to have a 2.2 cm mass in the inner aspect of the left breast.  The lymph nodes looked normal.  The mass was biopsied and came back as a grade 3 invasive ductal cancer that was weakly ER positive and PR negative and HER2 negative with a Ki-67 of 95%.  Amber Ross does have a history of left breast cancer treated with lumpectomy and radiation back in 2001.  Amber Ross does have some hypertension but does not smoke.    Review of Systems: A complete review of systems was obtained from the patient.  I have reviewed this information and discussed as appropriate with the patient.  See HPI as well for other ROS.  ROS   Medical History: Past Medical History:  Diagnosis Date  . GERD (gastroesophageal reflux disease)   . History of cancer   . Hyperlipidemia   . Hypertension     Patient Active Problem List  Diagnosis  . ADD (attention deficit disorder)  . Agatston coronary artery calcium  score less than 100  . Aortic atherosclerosis  . Atrophic vaginitis  . CAD (coronary artery disease)  . Chronic cough  . Decreased estrogen level  . Diastolic dysfunction  . Essential hypertension  . Gastroesophageal reflux disease  . History of primary malignant neoplasm of breast  . Major depression  . Malignant neoplasm of upper-inner quadrant of left breast in female, estrogen receptor positive (CMS/HHS-HCC)  . Osteopenia  . PFO (patent foramen ovale) (HHS-HCC)  .  Psoriasis  . Pure hypercholesterolemia  . Recurrent major depressive disorder, in remission ()  . Prediabetes  . Postoperative breast asymmetry  . Upper airway cough syndrome    Past Surgical History:  Procedure Laterality Date  .  Breast reduction surgery (Right  Right 09/28/2018  . Mastopexy (Left) Left 09/28/2018  . Breast biopsy (Left) Left 01/13/2024  . SABRABreast biopsy     Date Unknown  . Breast lumpectomy (Left) Left    Date Unknown  . CESAREAN SECTION N/A    Date Unknown  . Facial cosmetic surgery     Date Unknown  . REDUCTION MAMMAPLASTY BILATERAL Bilateral    Date Unknown     No Known Allergies  Current Outpatient Medications on File Prior to Visit  Medication Sig Dispense Refill  . atorvastatin  (LIPITOR) 40 MG tablet Take 40 mg by mouth once daily    . dextroamphetamine-amphetamine (ADDERALL) 10 mg tablet Take 10 mg by mouth as directed (Take 2 tablets by mouth in the morning, and 1 tablet by mouth in the evening.)    . lisinopriL-hydroCHLOROthiazide (ZESTORETIC) 20-12.5 mg tablet Take 1 tablet by mouth once daily    . omeprazole (PRILOSEC) 20 MG DR capsule Take 20 mg by mouth once daily    . venlafaxine (EFFEXOR-XR) 150 MG XR capsule Take 150 mg by mouth once daily  1 capsule with food    . vit C,E-Zn-coppr-lutein-zeaxan (OCUVITE LUTEIN AND  ZEAXANTHIN) 60 mg-13.5 mg- 15 mg-2 mg-6 mg Cap Take 1 capsule by mouth once daily     No current facility-administered medications on file prior to visit.    Family History  Problem Relation Age of Onset  . Breast cancer Neg Hx      Social History   Tobacco Use  Smoking Status Never  Smokeless Tobacco Never     Social History   Socioeconomic History  . Marital status: Divorced  Tobacco Use  . Smoking status: Never  . Smokeless tobacco: Never  Vaping Use  . Vaping status: Never Used  Substance and Sexual Activity  . Alcohol use: Never  . Drug use: Never   Social Drivers of Health   Housing Stability:  Unknown (01/21/2024)   Housing Stability Vital Sign   . Homeless in the Last Year: No    Objective:  There were no vitals filed for this visit.  There is no height or weight on file to calculate BMI.  Physical Exam Vitals reviewed.  Constitutional:      General: Amber Ross is not in acute distress.    Appearance: Normal appearance.  HENT:     Head: Normocephalic and atraumatic.     Right Ear: External ear normal.     Left Ear: External ear normal.     Nose: Nose normal.     Mouth/Throat:     Mouth: Mucous membranes are moist.     Pharynx: Oropharynx is clear.  Eyes:     General: No scleral icterus.    Extraocular Movements: Extraocular movements intact.     Conjunctiva/sclera: Conjunctivae normal.     Pupils: Pupils are equal, round, and reactive to light.  Cardiovascular:     Rate and Rhythm: Normal rate and regular rhythm.     Pulses: Normal pulses.     Heart sounds: Normal heart sounds.  Pulmonary:     Effort: Pulmonary effort is normal. No respiratory distress.     Breath sounds: Normal breath sounds.  Abdominal:     General: Bowel sounds are normal.     Palpations: Abdomen is soft.     Tenderness: There is no abdominal tenderness.  Musculoskeletal:        General: No swelling, tenderness or deformity. Normal range of motion.     Cervical back: Normal range of motion and neck supple.  Skin:    General: Skin is warm and dry.     Coloration: Skin is not jaundiced.  Neurological:     General: No focal deficit present.     Mental Status: Amber Ross is alert and oriented to person, place, and time.  Psychiatric:        Mood and Affect: Mood normal.        Behavior: Behavior normal.      Breast: There is a roughly 2 cm palpable mass in the inner aspect of the left breast.  There is no palpable mass in the right breast.  There is no palpable axillary, supraclavicular, or cervical lymphadenopathy  Labs, Imaging and Diagnostic Testing:   Assessment and Plan:     Diagnoses and  all orders for this visit:  Malignant neoplasm of upper-inner quadrant of left breast in female, estrogen receptor positive (CMS/HHS-HCC)    The patient appears to have a 2.2 cm high-grade cancer in the inner aspect of the left breast.  Because of her history of previous radiation I feel her most appropriate surgery would be a mastectomy and sentinel node biopsy.  I  have discussed with her in detail the risks and benefits of the operation as well as some of the technical aspects and Amber Ross understands and wishes to proceed.  Amber Ross would be interested in reconstruction so we will refer her to plastic surgery and coordinate with them.  Amber Ross will also meet with medical and radiation oncology to discuss adjuvant therapy.    DEWARD GARNETTE NULL, MD    I spent a total of 60 minutes in both face-to-face and non-face-to-face activities, excluding procedures performed, for this visit on the date of this encounter.

## 2024-01-22 ENCOUNTER — Encounter: Payer: Self-pay | Admitting: Genetic Counselor

## 2024-01-22 DIAGNOSIS — Z803 Family history of malignant neoplasm of breast: Secondary | ICD-10-CM | POA: Insufficient documentation

## 2024-01-22 NOTE — Progress Notes (Signed)
 REFERRING PROVIDER: Curvin Deward MOULD, MD [address] [place],  [place] [address]  PRIMARY PROVIDER:  Loreli Kins, MD  PRIMARY REASON FOR VISIT:  1. Family history of breast cancer   2. Malignant neoplasm of upper-inner quadrant of left breast in female, estrogen receptor positive (HCC)   3. History of breast cancer in female      HISTORY OF PRESENT ILLNESS:   Amber Ross, a 68 y.o. female, was seen for a [facility] cancer genetics consultation at the request of Dr. Curvin due to a personal and family history of breast cancer.  Amber Ross presents to clinic today to discuss the possibility of a hereditary predisposition to cancer, genetic testing, and to further clarify her future cancer risks, as well as potential cancer risks for family members.   In 2001, at the age of 41, Amber Ross was diagnosed with breast cancer.  This was treated with lumpectomy and radiation.  In September 2025, at the age of 59, she was diagnosed with cancer of the left breast.   CANCER HISTORY:  Oncology History  Malignant neoplasm of upper-inner quadrant of left breast in female, estrogen receptor positive (HCC)  01/13/2024 Initial Diagnosis   History of left breast cancer: 2001: Lumpectomy and radiation Screening mammogram detected left breast mass by ultrasound 2.2 cm at 9 o'clock position, axilla negative biopsy: Grade 3 IDC ER 85%, PR 0%, HER2 equivalent FISH pending, Ki-67 95%   01/21/2024 Cancer Staging   Staging form: Breast, AJCC 8th Edition - Clinical: Stage IIB (cT2, cN0, cM0, G3, ER+, PR-, HER2-) - Signed by Odean Potts, MD on 01/21/2024 Stage prefix: Initial diagnosis Histologic grading system: 3 grade system     Past Medical History:  Diagnosis Date   ADHD    Agatston coronary artery calcium  score less than 100    coronary CTA showed minimal plaque in the pLAD 07/2020   Aortic atherosclerosis    Atrophic vaginitis    Breast cancer (HCC)    left breast cancer 20 yrs ago    Cough    COVID 05/17/2019   Depression    Elevated cholesterol    Elevated cholesterol    Estrogen deficiency    Family history of breast cancer    Gastric reflux    History of breast cancer    Hypertension    OSA on CPAP    Osteopenia    Personal history of radiation therapy    PFO (patent foramen ovale)    small and noted on cardiac CTA   Psoriasis    SOB (shortness of breath)     Past Surgical History:  Procedure Laterality Date   BREAST BIOPSY     BREAST BIOPSY Left 01/13/2024   US  LT BREAST BX W LOC DEV 1ST LESION IMG BX SPEC US  GUIDE 01/13/2024 [facility] MAMMOGRAPHY   BREAST LUMPECTOMY Left    BREAST REDUCTION SURGERY Right 09/28/2018   Procedure: MAMMARY REDUCTION  (BREAST);  Surgeon: Lowery Estefana RAMAN, DO;  Location: [facility];  Service: Plastics;  Laterality: Right;   CESAREAN SECTION     FACIAL COSMETIC SURGERY     MASTOPEXY Left 09/28/2018   Procedure: MASTOPEXY;  Surgeon: Lowery Estefana RAMAN, DO;  Location: [facility];  Service: Plastics;  Laterality: Left;   REDUCTION MAMMAPLASTY Bilateral     Social History   Socioeconomic History   Marital status: Divorced    Spouse name: Not on file   Number of children: Not on  file   Years of education: Not on file   Highest education level: Not on file  Occupational History   Occupation: Mortgage loan processor   Tobacco Use   Smoking status: Former    Current packs/day: 0.00    Average packs/day: 0.3 packs/day for 15.0 years (3.8 ttl pk-yrs)    Types: Cigarettes    Start date: 04/22/1974    Quit date: 04/22/1989    Years since quitting: 34.7   Smokeless tobacco: Never  Substance and Sexual Activity   Alcohol use: Yes    Comment: 1-2 times per month   Drug use: No   Sexual activity: Not on file  Other Topics Concern   Not on file  Social History Narrative   Not on file   Social Drivers of Health   Financial Resource Strain: Not on file  Food Insecurity: No Food Insecurity  (01/21/2024)   Hunger Vital Sign    Worried About Running Out of Food in the Last Year: Never true    Ran Out of Food in the Last Year: Never true  Transportation Needs: No Transportation Needs (01/21/2024)   PRAPARE - Administrator, Civil Service (Medical): No    Lack of Transportation (Non-Medical): No  Physical Activity: Not on file  Stress: Not on file  Social Connections: Not on file     FAMILY HISTORY:  We obtained a detailed, 4-generation family history.  Significant diagnoses are listed below: Family History  Problem Relation Age of Onset   Dementia Mother    Fibroids Mother    Breast cancer Maternal Aunt    Lung cancer Maternal Aunt    Cancer Maternal Uncle        Nasal   Stroke Maternal Uncle    Breast cancer Half-Sister    Cancer Half-Sister        NOS   Cancer Half-Sister        NOS     The patient has two children who are cancer free.  She has a full sister and maternal half sister who are cancer free.  Her paternal half siblings have breast and other cancers, but she is not close with this side of the family.  She does not have any information on her father's siblings or his family.  Her mother is deceased at 70.  She had two brothers and two sisters. One sister had breast cancer.  There are other non-contributory cancers on this side of the family.  Amber Ross is unaware of previous family history of genetic testing for hereditary cancer risks. There is no reported Ashkenazi Jewish ancestry. There is no known consanguinity.  GENETIC COUNSELING ASSESSMENT: Amber Ross is a 68 y.o. female with a personal and family history of cancer which is somewhat suggestive of a hereditary cancer syndrome and predisposition to cancer given her young age of diagnosis. We, therefore, discussed and recommended the following at today's visit.   DISCUSSION: We discussed that, in general, most cancer is not inherited in families, but instead is sporadic or familial.  Sporadic cancers occur by chance and typically happen at older ages (>50 years) as this type of cancer is caused by genetic changes acquired during an individual's lifetime. Some families have more cancers than would be expected by chance; however, the ages or types of cancer are not consistent with a known genetic mutation or known genetic mutations have been ruled out. This type of familial cancer is thought to be due to a combination of  multiple genetic, environmental, hormonal, and lifestyle factors. While this combination of factors likely increases the risk of cancer, the exact source of this risk is not currently identifiable or testable.  We discussed that 5 - 10% of breast cancer is hereditary, with most cases associated with BRCA mutations.  There are other genes that can be associated with hereditary breast cancer syndromes.  These include ATM, CHEK2 and PALB2.  We discussed that testing is beneficial for several reasons including knowing how to follow individuals after completing their treatment, identifying whether potential treatment options such as PARP inhibitors would be beneficial, and understand if other family members could be at risk for cancer and allow them to undergo genetic testing.   We reviewed the characteristics, features and inheritance patterns of hereditary cancer syndromes. We also discussed genetic testing, including the appropriate family members to test, the process of testing, insurance coverage and turn-around-time for results. We discussed the implications of a negative, positive, carrier and/or variant of uncertain significant result. Amber Ross  was offered a common hereditary cancer panel (36+ genes) and an expanded pan-cancer panel (70+ genes). Amber Ross was informed of the benefits and limitations of each panel, including that expanded pan-cancer panels contain genes that do not have clear management guidelines at this point in time.  We also discussed that as the  number of genes included on a panel increases, the chances of variants of uncertain significance increases. Amber Ross decided to pursue genetic testing for the Multi-cancer + RNA gene panel.   The Multi-Cancer + RNA Panel offered by [facility] includes sequencing and/or deletion/duplication analysis of the following 70 genes:  AIP*, ALK, APC*, ATM*, AXIN2*, BAP1*, BARD1*, BLM*, BMPR1A*, BRCA1*, BRCA2*, BRIP1*, CDC73*, CDH1*, CDK4, CDKN1B*, CDKN2A, CHEK2*, CTNNA1*, DICER1*, EPCAM (del/dup only), EGFR, FH*, FLCN*, GREM1 (promoter dup only), HOXB13, KIT, LZTR1, MAX*, MBD4, MEN1*, MET, MITF, MLH1*, MSH2*, MSH3*, MSH6*, MUTYH*, NF1*, NF2*, NTHL1*, PALB2*, PDGFRA, PMS2*, POLD1*, POLE*, POT1*, PRKAR1A*, PTCH1*, PTEN*, RAD51C*, RAD51D*, RB1*, RET, SDHA* (sequencing only), SDHAF2*, SDHB*, SDHC*, SDHD*, SMAD4*, SMARCA4*, SMARCB1*, SMARCE1*, STK11*, SUFU*, TMEM127*, TP53*, TSC1*, TSC2*, VHL*. RNA analysis is performed for * genes.   Based on Amber Ross's personal and family history of cancer, she meets medical criteria for genetic testing.  Despite that she meets criteria, she may still have an out of pocket cost. We discussed that if her out of pocket cost for testing is over $100, the laboratory will call and confirm whether she wants to proceed with testing.  If the out of pocket cost of testing is less than $100 she will be billed by the genetic testing laboratory.   PLAN: After considering the risks, benefits, and limitations, Amber Ross provided informed consent to pursue genetic testing and the blood sample was sent to [facility] for analysis of the Multi-Cancer+RNA panel. Results should be available within approximately 2-3 weeks' time, at which point they will be disclosed by telephone to Amber Ross, as will any additional recommendations warranted by these results. Amber Ross will receive a summary of her genetic counseling visit and a copy of her results once available. This information will also be  available in Epic.   Lastly, we encouraged Amber Ross to remain in contact with cancer genetics annually so that we can continuously update the family history and inform her of any changes in cancer genetics and testing that may be of benefit for this family.   Amber Ross questions were answered to her satisfaction today. Our contact information was provided should  additional questions or concerns arise. Thank you for the referral and allowing us  to share in the care of your patient.   Jarquez Mestre P. Perri, MS, CGC Licensed, Patent attorney [email or web address] phone: [phone]  In total, 40 minutes were spent on the date of the encounter in service to the patient including preparation, face-to-face consultation, documentation and care coordination.  The patient brought her sister and best friend. Drs. Lanny Stalls, and/or Gudena were available for questions, if needed..    _______________________________________________________________________ For Office Staff:  Number of people involved in session: 3 Was an Intern/ student involved with case: no

## 2024-01-23 ENCOUNTER — Encounter: Payer: Self-pay | Admitting: Plastic Surgery

## 2024-01-23 ENCOUNTER — Ambulatory Visit: Admitting: Plastic Surgery

## 2024-01-23 VITALS — BP 167/109 | HR 94 | Ht 67.0 in | Wt 165.5 lb

## 2024-01-23 DIAGNOSIS — Z170 Estrogen receptor positive status [ER+]: Secondary | ICD-10-CM

## 2024-01-23 DIAGNOSIS — C50212 Malignant neoplasm of upper-inner quadrant of left female breast: Secondary | ICD-10-CM

## 2024-01-23 DIAGNOSIS — Z923 Personal history of irradiation: Secondary | ICD-10-CM | POA: Diagnosis not present

## 2024-01-23 NOTE — Progress Notes (Signed)
 Patient ID: Amber Ross, female    DOB: March 03, 1956, 68 y.o.   MRN: [id number]   No chief complaint on file.   The patient is a 68 year old female here for a consultation for breast reconstruction.  She is known to the practice she was seen in 2020 after having a left partial mastectomy 20 years before that.  She did get postop radiation to the left breast.  We did a right breast reduction and a left breast mastopexy.  She was pleased with the results and then recently went for a mammogram.  She was found to have an abnormal left breast mammogram that led to an ultrasound and then a biopsy showing malignant neoplasm of the upper inner quadrant of the left breast.  It is estrogen receptor positive.  She has sent for genetic testing and has not gotten that back yet.  She is aware that she needs a left breast mastectomy.  Her past medical history and surgical history is listed below.  She is 5 feet 7 inches tall and weighs 165 pounds.    Review of Systems  Constitutional: Negative.   HENT: Negative.    Eyes: Negative.   Respiratory: Negative.    Cardiovascular: Negative.   Gastrointestinal: Negative.   Endocrine: Negative.   Genitourinary: Negative.     Past Medical History:  Diagnosis Date   ADHD    Agatston coronary artery calcium  score less than 100    coronary CTA showed minimal plaque in the pLAD 07/2020   Aortic atherosclerosis    Atrophic vaginitis    Breast cancer (HCC)    left breast cancer 20 yrs ago   Cough    COVID 05/17/2019   Depression    Elevated cholesterol    Elevated cholesterol    Estrogen deficiency    Family history of breast cancer    Gastric reflux    History of breast cancer    Hypertension    OSA on CPAP    Osteopenia    Personal history of radiation therapy    PFO (patent foramen ovale)    small and noted on cardiac CTA   Psoriasis    SOB (shortness of breath)     Past Surgical History:  Procedure Laterality Date   BREAST BIOPSY      BREAST BIOPSY Left 01/13/2024   US  LT BREAST BX W LOC DEV 1ST LESION IMG BX SPEC US  GUIDE 01/13/2024 [facility]AMMO[facility]   BREAST LUMPECTOMY Left    BREAST REDUCTION SURGERY Right 09/28/2018   Procedure: MAMMARY REDUCTION  (BREAST);  Surgeon: Lowery Estefana RAMAN, DO;  Location: [facility];  Service: Plastics;  Laterality: Right;   CESAREAN SECTION     FACIAL COSMETIC SURGERY     MASTOPEXY Left 09/28/2018   Procedure: MASTOPEXY;  Surgeon: Lowery Estefana RAMAN, DO;  Location: [facility];  Service: Plastics;  Laterality: Left;   REDUCTION MAMMAPLASTY Bilateral       Current Outpatient Medications:    amphetamine-dextroamphetamine (ADDERALL) 10 MG tablet, Take 2 tablets by mouth in the morning, and 1 tablet by mouth in the evening., Disp: , Rfl:    atorvastatin  (LIPITOR) 40 MG tablet, Take 1 tablet (40 mg total) by mouth daily., Disp: 30 tablet, Rfl: 0   Cholecalciferol (VITAMIN D3) 125 MCG (5000 UT) CAPS, Take by mouth., Disp: , Rfl:    lisinopril-hydrochlorothiazide (ZESTORETIC) 20-12.5 MG tablet, Take 1 tablet by mouth daily., Disp: , Rfl:    metoprolol   tartrate (LOPRESSOR ) 100 MG tablet, Tale 1 tablet (100 mg total) two hours prior to CT scan (Patient not taking: Reported on 01/23/2024), Disp: 1 tablet, Rfl: 0   Multiple Vitamins-Minerals (HAIR/SKIN/NAILS) CAPS, Take by mouth., Disp: , Rfl:    omeprazole (PRILOSEC) 20 MG capsule, Take 20 mg by mouth daily., Disp: , Rfl:    venlafaxine XR (EFFEXOR-XR) 150 MG 24 hr capsule, 1 capsule with food, Disp: , Rfl:    vitamin B-12 (CYANOCOBALAMIN) 1000 MCG tablet, Take 1,000 mcg by mouth daily., Disp: , Rfl:    Objective:   Vitals:   01/23/24 1057  BP: (!) 167/109  Pulse: 94  SpO2: 96%    Physical Exam Vitals reviewed.  Constitutional:      Appearance: Normal appearance.  HENT:     Head: Atraumatic.  Cardiovascular:     Rate and Rhythm: Normal rate.     Pulses: Normal pulses.  Pulmonary:     Effort:  Pulmonary effort is normal.  Skin:    General: Skin is warm.     Capillary Refill: Capillary refill takes less than 2 seconds.     Coloration: Skin is not jaundiced.  Neurological:     Mental Status: She is alert and oriented to person, place, and time.  Psychiatric:        Mood and Affect: Mood normal.        Behavior: Behavior normal.        Thought Content: Thought content normal.        Judgment: Judgment normal.     Assessment & Plan:  Malignant neoplasm of upper-inner quadrant of left breast in female, estrogen receptor positive (HCC)  The options for reconstruction we explained to the patient / family for breast reconstruction.  There are two general categories of reconstruction.  We can reconstruction a breast with implants or use the patient's own tissue.  These were further discussed as listed.  Breast reconstruction is an optional procedure and eligibility depends on the full spectrum of the health of the patient and any co-morbidities.  More than one surgery is often needed to complete the reconstruction process.  The process can take three to twelve months to complete.  The breasts will not be identical due to many factors such as rib differences, shoulder asymmetry and treatments such as radiation.  The goal is to get the breasts to look normal and symmetrical in clothes.  Scars are a part of surgery and may fade some in time but will always be present under clothes.  Surgery may be an option on the non-cancer breast to achieve more symmetry.  No matter which procedure is chosen there is always the risk of complications and even failure of the body to heal.  This could result in no breast.    The options for reconstruction include:  1. Placement of a tissue expander with Acellular dermal matrix. When the expander is the desired size surgery is performed to remove the expander and place an implant.  In some cases the implant can be placed without an expander.  2. Autologous  reconstruction can include using a muscle or tissue from another area of the body to create a breast.  3. Combined procedures (ie. latissismus dorsi flap) can be done with an expander / implant placed under the muscle.   The risks, benefits, scars and recovery time were discussed for each of the above. Risks include bleeding, infection, hematoma, seroma, scarring, pain, wound healing complications, flap loss, fat necrosis, capsular  contracture, need for implant removal, donor site complications, bulge, hernia, umbilical necrosis, need for urgent reoperation, and need for dressing changes.   The procedure the patient selected / that was best for the patient, was then discussed in further detail.  Total time: 45 minutes. This includes time spent with the patient during the visit as well as time spent before and after the visit reviewing the chart, documenting the encounter, making phone calls and reviewing studies.   With the patient's history of radiation and implant may not work.  However at her age a flap is not the greatest idea either.  I think it is reasonable to try to put a expander and implant in on the left and then we can certainly do a lift of the right breast.  The patient knows that if the skin is too tight at the time of the surgery then we would not be able to do anything.  She seems quite reasonable and handling this difficult situation very well.  I will be prepared for putting the expander above or below the muscle depending on the skin flaps at the time of surgery.  Pictures were obtained of the patient and placed in the chart with the patient's or guardian's permission.   Estefana RAMAN Deanette Tullius, DO

## 2024-01-29 ENCOUNTER — Telehealth: Payer: Self-pay | Admitting: *Deleted

## 2024-01-29 ENCOUNTER — Encounter: Payer: Self-pay | Admitting: *Deleted

## 2024-01-29 ENCOUNTER — Telehealth: Payer: Self-pay | Admitting: Genetic Counselor

## 2024-01-29 ENCOUNTER — Encounter: Payer: Self-pay | Admitting: Genetic Counselor

## 2024-01-29 ENCOUNTER — Ambulatory Visit: Payer: Self-pay | Admitting: Genetic Counselor

## 2024-01-29 DIAGNOSIS — C50212 Malignant neoplasm of upper-inner quadrant of left female breast: Secondary | ICD-10-CM

## 2024-01-29 DIAGNOSIS — Z1379 Encounter for other screening for genetic and chromosomal anomalies: Secondary | ICD-10-CM

## 2024-01-29 NOTE — Telephone Encounter (Addendum)
 Received call from patient regarding her surgery date and returned call. She stated she now has a surgery date of Oct 27 and wanted to make sure it will be preauthorized from Dr. Ace office in a timely manner so surgery is not delayed. I informed her I was not exactly sure how the preauthorization works but I would call the office and inquire for the patient and call her back. She appreciated the call back.   Update : Navigator contacted Powell Glass at Dr. Ace office who works with [employer]. Powell has reached out to the patient and left her a message. This navigator as well called patient back to follow up and let her know that Powell would be able to most likely answer her questions. She  has my information if needed for any other concerns.

## 2024-01-29 NOTE — Telephone Encounter (Signed)
 I contacted  Amber Ross to discuss her genetic testing results. No pathogenic variants were identified in the 70 genes analyzed. Discussed that we do not know why she has breast cancer or why there is cancer in the family. It could be due to a different gene that we are not testing, or maybe our current technology may not be able to pick something up.  It will be important for her to keep in contact with genetics to keep up with whether additional testing may be needed.Detailed clinic note to follow.   The test report will be scanned into EPIC and will be located under the Molecular Pathology section of the Results Review tab.  A portion of the result report is included below for reference.

## 2024-01-29 NOTE — Progress Notes (Signed)
 HPI:  Ms. Amber Ross was previously seen in the [facility] due to a personal and family history of breast cancer and concerns regarding a hereditary predisposition to cancer. Please refer to our prior cancer genetics clinic note for more information regarding our discussion, assessment and recommendations, at the time. Amber Ross recent genetic test results were disclosed to her, as were recommendations warranted by these results. These results and recommendations are discussed in more detail below.  CANCER HISTORY:  Oncology History  Malignant neoplasm of upper-inner quadrant of left breast in female, estrogen receptor positive (HCC)  01/13/2024 Initial Diagnosis   History of left breast cancer: 2001: Lumpectomy and radiation Screening mammogram detected left breast mass by ultrasound 2.2 cm at 9 o'clock position, axilla negative biopsy: Grade 3 IDC ER 85%, PR 0%, HER2 equivalent FISH pending, Ki-67 95%   01/21/2024 Cancer Staging   Staging form: Breast, AJCC 8th Edition - Clinical: Stage IIB (cT2, cN0, cM0, G3, ER+, PR-, HER2-) - Signed by Odean Potts, MD on 01/21/2024 Stage prefix: Initial diagnosis Histologic grading system: 3 grade system   01/27/2024 Genetic Testing   Negative genetic testing The report date is January 27, 2024.  The Multi-Cancer + RNA Panel offered by [facility] includes sequencing and/or deletion/duplication analysis of the following 70 genes:  AIP*, ALK, APC*, ATM*, AXIN2*, BAP1*, BARD1*, BLM*, BMPR1A*, BRCA1*, BRCA2*, BRIP1*, CDC73*, CDH1*, CDK4, CDKN1B*, CDKN2A, CHEK2*, CTNNA1*, DICER1*, EPCAM (del/dup only), EGFR, FH*, FLCN*, GREM1 (promoter dup only), HOXB13, KIT, LZTR1, MAX*, MBD4, MEN1*, MET, MITF, MLH1*, MSH2*, MSH3*, MSH6*, MUTYH*, NF1*, NF2*, NTHL1*, PALB2*, PDGFRA, PMS2*, POLD1*, POLE*, POT1*, PRKAR1A*, PTCH1*, PTEN*, RAD51C*, RAD51D*, RB1*, RET, SDHA* (sequencing only), SDHAF2*, SDHB*, SDHC*, SDHD*, SMAD4*, SMARCA4*, SMARCB1*, SMARCE1*, STK11*,  SUFU*, TMEM127*, TP53*, TSC1*, TSC2*, VHL*. RNA analysis is performed for * genes.      FAMILY HISTORY:  We obtained a detailed, 4-generation family history.  Significant diagnoses are listed below: Family History  Problem Relation Age of Onset   Dementia Mother    Fibroids Mother    Breast cancer Maternal Aunt    Lung cancer Maternal Aunt    Cancer Maternal Uncle        Nasal   Stroke Maternal Uncle    Breast cancer Half-Sister    Cancer Half-Sister        NOS   Cancer Half-Sister        NOS       The patient has two children who are cancer free.  She has a full sister and maternal half sister who are cancer free.  Her paternal half siblings have breast and other cancers, but she is not close with this side of the family.  She does not have any information on her father's siblings or his family.   Her mother is deceased at 22.  She had two brothers and two sisters. One sister had breast cancer.  There are other non-contributory cancers on this side of the family.   Amber Ross is unaware of previous family history of genetic testing for hereditary cancer risks. There is no reported Ashkenazi Jewish ancestry. There is no known consanguinity  GENETIC TEST RESULTS: Genetic testing reported out on January 27, 2024 through the Multi-cancer+RNA cancer panel found no pathogenic mutations. The Multi-Cancer + RNA Panel offered by [facility] includes sequencing and/or deletion/duplication analysis of the following 70 genes:  AIP*, ALK, APC*, ATM*, AXIN2*, BAP1*, BARD1*, BLM*, BMPR1A*, BRCA1*, BRCA2*, BRIP1*, CDC73*, CDH1*, CDK4, CDKN1B*, CDKN2A, CHEK2*, CTNNA1*, DICER1*, EPCAM (del/dup  only), EGFR, FH*, FLCN*, GREM1 (promoter dup only), HOXB13, KIT, LZTR1, MAX*, MBD4, MEN1*, MET, MITF, MLH1*, MSH2*, MSH3*, MSH6*, MUTYH*, NF1*, NF2*, NTHL1*, PALB2*, PDGFRA, PMS2*, POLD1*, POLE*, POT1*, PRKAR1A*, PTCH1*, PTEN*, RAD51C*, RAD51D*, RB1*, RET, SDHA* (sequencing only), SDHAF2*, SDHB*, SDHC*, SDHD*, SMAD4*,  SMARCA4*, SMARCB1*, SMARCE1*, STK11*, SUFU*, TMEM127*, TP53*, TSC1*, TSC2*, VHL*. RNA analysis is performed for * genes. The test report has been scanned into EPIC and is located under the Molecular Pathology section of the Results Review tab.  A portion of the result report is included below for reference.     We discussed with Amber Ross that because current genetic testing is not perfect, it is possible there may be a gene mutation in one of these genes that current testing cannot detect, but that chance is small.  We also discussed, that there could be another gene that has not yet been discovered, or that we have not yet tested, that is responsible for the cancer diagnoses in the family. It is also possible there is a hereditary cause for the cancer in the family that Amber Ross did not inherit and therefore was not identified in her testing.  Therefore, it is important to remain in touch with cancer genetics in the future so that we can continue to offer Amber Ross the most up to date genetic testing.   ADDITIONAL GENETIC TESTING: We discussed with Amber Ross that her genetic testing was fairly extensive.  If there are genes identified to increase cancer risk that can be analyzed in the future, we would be happy to discuss and coordinate this testing at that time.    CANCER SCREENING RECOMMENDATIONS: Amber Ross test result is considered negative (normal).  This means that we have not identified a hereditary cause for her personal and family history of breast cancer at this time. Most cancers happen by chance and this negative test suggests that her personal and family history of breast cancer may fall into this category.    Possible reasons for Amber Ross's negative genetic test include:  1. There may be a gene mutation in one of these genes that current testing methods cannot detect but that chance is small.  2. There could be another gene that has not yet been discovered, or that we have not  yet tested, that is responsible for the cancer diagnoses in the family.  3.  There may be no hereditary risk for cancer in the family. The cancers in Amber Ross and/or her family may be sporadic/familial or due to other genetic and environmental factors. 4. It is also possible there is a hereditary cause for the cancer in the family that Amber Ross did not inherit.  Therefore, it is recommended she continue to follow the cancer management and screening guidelines provided by her primary healthcare provider. An individual's cancer risk and medical management are not determined by genetic test results alone. Overall cancer risk assessment incorporates additional factors, including personal medical history, family history, and any available genetic information that may result in a personalized plan for cancer prevention and surveillance  RECOMMENDATIONS FOR FAMILY MEMBERS:   Since she did not inherit a identifiable mutation in a cancer predisposition gene included on this panel, her children could not have inherited a known mutation from her in one of these genes. Individuals in this family might be at some increased risk of developing cancer, over the general population risk, simply due to the family history of cancer.  We recommended women in this family have  a yearly mammogram beginning at age 8, or 1 years younger than the earliest onset of cancer, an annual clinical breast exam, and perform monthly breast self-exams. Women in this family should also have a gynecological exam as recommended by their primary provider. All family members should be referred for colonoscopy starting at age 73, or 74 years younger than the earliest onset of cancer.  FOLLOW-UP: Lastly, we discussed with Amber Ross that cancer genetics is a rapidly advancing field and it is possible that new genetic tests will be appropriate for her and/or her family members in the future. We encouraged her to remain in contact with cancer  genetics on an annual basis so we can update her personal and family histories and let her know of advances in cancer genetics that may benefit this family.   Our contact number was provided. Amber Ross questions were answered to her satisfaction, and she knows she is welcome to call us  at anytime with additional questions or concerns.   Darice Monte, MS, [facility] Licensed, Certified Genetic Counselor [email or web address]

## 2024-01-29 NOTE — Telephone Encounter (Signed)
 Reviewed [facility] clinic f/u when spoke to patient on phone earlier today. No other questions or concerns other than ones addressed in earlier note today at 1239.

## 2024-01-29 NOTE — Telephone Encounter (Signed)
 Error - duplicate note.

## 2024-01-29 NOTE — Telephone Encounter (Signed)
 Opened in error

## 2024-01-30 ENCOUNTER — Telehealth: Payer: Self-pay | Admitting: Hematology and Oncology

## 2024-01-30 ENCOUNTER — Encounter: Payer: Self-pay | Admitting: *Deleted

## 2024-01-30 DIAGNOSIS — C50212 Malignant neoplasm of upper-inner quadrant of left female breast: Secondary | ICD-10-CM

## 2024-01-30 NOTE — Telephone Encounter (Signed)
 left vm for pt about scheduled appt date and time. Encouraged to call back is need to reschedule

## 2024-01-31 ENCOUNTER — Encounter: Payer: Self-pay | Admitting: Hematology and Oncology

## 2024-02-02 ENCOUNTER — Ambulatory Visit (INDEPENDENT_AMBULATORY_CARE_PROVIDER_SITE_OTHER): Admitting: Physician Assistant

## 2024-02-02 VITALS — BP 142/82 | HR 85 | Ht 67.0 in | Wt 166.6 lb

## 2024-02-02 DIAGNOSIS — C50212 Malignant neoplasm of upper-inner quadrant of left female breast: Secondary | ICD-10-CM | POA: Diagnosis not present

## 2024-02-02 DIAGNOSIS — Z170 Estrogen receptor positive status [ER+]: Secondary | ICD-10-CM | POA: Diagnosis not present

## 2024-02-02 DIAGNOSIS — Z923 Personal history of irradiation: Secondary | ICD-10-CM | POA: Diagnosis not present

## 2024-02-02 MED ORDER — DIAZEPAM 2 MG PO TABS: 2.0000 mg | ORAL_TABLET | Freq: Two times a day (BID) | ORAL | 0 refills | Status: AC | PRN

## 2024-02-02 MED ORDER — ONDANSETRON 4 MG PO TBDP: 4.0000 mg | ORAL_TABLET | Freq: Three times a day (TID) | ORAL | 0 refills | Status: AC | PRN

## 2024-02-02 MED ORDER — CEPHALEXIN 500 MG PO CAPS: 500.0000 mg | ORAL_CAPSULE | Freq: Four times a day (QID) | ORAL | 0 refills | Status: AC

## 2024-02-02 MED ORDER — OXYCODONE HCL 5 MG PO TABS: 5.0000 mg | ORAL_TABLET | Freq: Three times a day (TID) | ORAL | 0 refills | Status: AC | PRN

## 2024-02-02 NOTE — H&P (View-Only) (Signed)
 Patient ID: Amber Ross, female    DOB: [birth year], 68 y.o.   MRN: [id number]  Chief Complaint  Patient presents with   Pre-op Exam      ICD-10-CM   1. Malignant neoplasm of upper-inner quadrant of left breast in female, estrogen receptor positive (HCC)  C50.212    Z17.0        History of Present Illness: Amber Ross is a 68 y.o.  female  with a history of left breast cancer with previous partial mastectomy and radiation.  She presents for preoperative evaluation for upcoming procedure, left mastectomy with placement of tissue expander and Flex HD, scheduled for 02/16/2024 with Dr. Lowery and Dr. Curvin.  The patient has not had problems with anesthesia.  She reports 20-pack-year smoking history, but quit 34 years ago.  She denies any varicosities, inflammatory bowel disease, or significant cardiac disease.  She denies any personal or family history of blood clots or clotting disorder.  She understands the risks of implant-based reconstruction and would like to proceed.  Her goal is simply for symmetry with contralateral side.  She will hold all of her vitamins and supplements for 1 week prior to surgery.  She will also hold her Adderall day of surgery.  She does report that she will take ASA 81 mg occasionally (self-directed), but understands the importance of holding it at least 7 days prior to surgery.  Summary of Previous Visit: She was seen for consult by Dr. Lowery 01/23/2024.  She had previously had oncoplastic right breast reduction in 2020 given her history of left partial mastectomy.  Recent imaging noted new lesion left breast, biopsy-proven malignancy.  Plan is for unilateral mastectomy with tissue expander reconstruction.  She will also have a right mastopexy at time of left breast implant exchange.  Risks of surgery discussed with patient, particular given history of radiation.  Understanding and agreeable to proceed.  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  Discussed 2 weeks FMLA/STD.  Will extend if needed.  PMH Significant for: Left breast cancer with previous partial mastectomy and right oncoplastic reduction for symmetry, postoperative breast asymmetry, ADHD, HLD, GERD.   Past Medical History: Allergies: No Known Allergies  Current Medications:  Current Outpatient Medications:    amphetamine-dextroamphetamine (ADDERALL) 10 MG tablet, Take 2 tablets by mouth in the morning, and 1 tablet by mouth in the evening., Disp: , Rfl:    atorvastatin  (LIPITOR) 40 MG tablet, Take 1 tablet (40 mg total) by mouth daily., Disp: 30 tablet, Rfl: 0   cephALEXin  (KEFLEX ) 500 MG capsule, Take 1 capsule (500 mg total) by mouth 4 (four) times daily for 3 days., Disp: 12 capsule, Rfl: 0   Cholecalciferol (VITAMIN D3) 125 MCG (5000 UT) CAPS, Take by mouth., Disp: , Rfl:    diazepam (VALIUM) 2 MG tablet, Take 1 tablet (2 mg total) by mouth every 12 (twelve) hours as needed for muscle spasms., Disp: 20 tablet, Rfl: 0   lisinopril-hydrochlorothiazide (ZESTORETIC) 20-12.5 MG tablet, Take 1 tablet by mouth daily., Disp: , Rfl:    Multiple Vitamins-Minerals (HAIR/SKIN/NAILS) CAPS, Take by mouth., Disp: , Rfl:    omeprazole (PRILOSEC) 20 MG capsule, Take 20 mg by mouth daily., Disp: , Rfl:    ondansetron  (ZOFRAN -ODT) 4 MG disintegrating tablet, Take 1 tablet (4 mg total) by mouth every 8 (eight) hours as needed for nausea or vomiting., Disp: 20 tablet, Rfl: 0   oxyCODONE  (ROXICODONE ) 5 MG immediate release tablet, Take 1 tablet (5  mg total) by mouth every 8 (eight) hours as needed for up to 5 days for severe pain (pain score 7-10)., Disp: 15 tablet, Rfl: 0   venlafaxine XR (EFFEXOR-XR) 150 MG 24 hr capsule, 1 capsule with food, Disp: , Rfl:    vitamin B-12 (CYANOCOBALAMIN) 1000 MCG tablet, Take 1,000 mcg by mouth daily., Disp: , Rfl:    metoprolol  tartrate (LOPRESSOR ) 100 MG tablet, Tale 1 tablet (100 mg total) two hours prior to CT scan (Patient not taking:  Reported on 01/23/2024), Disp: 1 tablet, Rfl: 0  Past Medical Problems: Past Medical History:  Diagnosis Date   ADHD    Agatston coronary artery calcium  score less than 100    coronary CTA showed minimal plaque in the pLAD 07/2020   Aortic atherosclerosis    Atrophic vaginitis    Breast cancer (HCC)    left breast cancer 20 yrs ago   Cough    COVID 05/17/2019   Depression    Elevated cholesterol    Elevated cholesterol    Estrogen deficiency    Family history of breast cancer    Gastric reflux    History of breast cancer    Hypertension    OSA on CPAP    Osteopenia    Personal history of radiation therapy    PFO (patent foramen ovale)    small and noted on cardiac CTA   Psoriasis    SOB (shortness of breath)     Past Surgical History: Past Surgical History:  Procedure Laterality Date   BREAST BIOPSY     BREAST BIOPSY Left 01/13/2024   US  LT BREAST BX W LOC DEV 1ST LESION IMG BX SPEC US  GUIDE 01/13/2024 [facility] MAMMOGRAP[facility]   BREAST LUMPECTOMY Left    BREAST REDUCTION SURGERY Right 09/28/2018   Procedure: MAMMARY REDUCTION  (BREAST);  Surgeon: Lowery Estefana RAMAN, DO;  Location: [facility];  Service: Plastics;  Laterality: Right;   CESAREAN SECTION     FACIAL COSMETIC SURGERY     MASTOPEXY Left 09/28/2018   Procedure: MASTOPEXY;  Surgeon: Lowery Estefana RAMAN, DO;  Location: [facility];  Service: Plastics;  Laterality: Left;   REDUCTION MAMMAPLASTY Bilateral     Social History: Social History   Socioeconomic History   Marital status: Divorced    Spouse name: Not on file   Number of children: Not on file   Years of education: Not on file   Highest education level: Not on file  Occupational History   Occupation: Mortgage loan processor   Tobacco Use   Smoking status: Former    Current packs/day: 0.00    Average packs/day: 0.3 packs/day for 15.0 years (3.8 ttl pk-yrs)    Types: Cigarettes    Start date: 04/22/1974    Quit date:  04/22/1989    Years since quitting: 34.8   Smokeless tobacco: Never  Substance and Sexual Activity   Alcohol use: Yes    Comment: 1-2 times per month   Drug use: No   Sexual activity: Not on file  Other Topics Concern   Not on file  Social History Narrative   Not on file   Social Drivers of Health   Financial Resource Strain: Not on file  Food Insecurity: No Food Insecurity (01/21/2024)   Hunger Vital Sign    Worried About Running Out of Food in the Last Year: Never true    Ran Out of Food in the Last Year: Never true  Transportation Needs: No Transportation Needs (  01/21/2024)   PRAPARE - Administrator, Civil Service (Medical): No    Lack of Transportation (Non-Medical): No  Physical Activity: Not on file  Stress: Not on file  Social Connections: Not on file  Intimate Partner Violence: Not At Risk (01/21/2024)   Humiliation, Afraid, Rape, and Kick questionnaire    Fear of Current or Ex-Partner: No    Emotionally Abused: No    Physically Abused: No    Sexually Abused: No    Family History: Family History  Problem Relation Age of Onset   Dementia Mother    Fibroids Mother    Breast cancer Maternal Aunt    Lung cancer Maternal Aunt    Cancer Maternal Uncle        Nasal   Stroke Maternal Uncle    Breast cancer Half-Sister    Cancer Half-Sister        NOS   Cancer Half-Sister        NOS    Review of Systems: ROS Denies any recent chest pain, difficulty breathing, leg swelling, fevers.  Physical Exam: Vital Signs BP (!) 142/82 (BP Location: Left Arm, Patient Position: Sitting, Cuff Size: Normal)   Pulse 85   Ht 5' 7 (1.702 m)   Wt 166 lb 9.6 oz (75.6 kg)   SpO2 100%   BMI 26.09 kg/m   Physical Exam Constitutional:      General: Not in acute distress.    Appearance: Normal appearance. Not ill-appearing.  HENT:     Head: Normocephalic and atraumatic.  Eyes:     Pupils: Pupils are equal, round. Cardiovascular:     Rate and Rhythm: Normal  rate.    Pulses: Normal pulses.  Pulmonary:     Effort: No respiratory distress or increased work of breathing.  Speaks in full sentences. Abdominal:     General: Abdomen is flat. No distension.   Musculoskeletal: Normal range of motion. No lower extremity swelling or edema.  Spider veins, but no varicosities. Skin:    General: Skin is warm and dry.     Findings: No erythema or rash.  Neurological:     Mental Status: Alert and oriented to person, place, and time.  Psychiatric:        Mood and Affect: Mood normal.        Behavior: Behavior normal.    Assessment/Plan: The patient is scheduled for left mastectomy with placement of tissue expander and Flex HD with Dr. Lowery.  Risks, benefits, and alternatives of procedure discussed, questions answered and consent obtained.    Smoking Status: Former smoker, quit 34 years ago.  Last Mammogram: 12/2023; Results: Biopsy-proven malignancy.  Caprini Score: 8; Risk Factors include: Age, breast cancer, COPD, BMI greater than 25, and length of planned surgery. Recommendation for mechanical and possibly pharmacological prophylaxis.  Will discuss with surgeon and prescribe Lovenox if deemed indicated.  Encourage early ambulation.   Pictures obtained: 01/23/2024  Post-op Rx sent to pharmacy: Oxycodone , Zofran , Keflex , Valium.  Patient was provided with the General Surgical Risk consent document and Pain Medication Agreement prior to their appointment.  They had adequate time to read through the risk consent documents and Pain Medication Agreement. We also discussed them in person together during this preop appointment. All of their questions were answered to their satisfaction.  Recommended calling if they have any further questions.  Risk consent form and Pain Medication Agreement to be scanned into patient's chart.  The risks that can be encountered with and after  placement of a breast expander placement were discussed and include the following  but not limited to these: bleeding, infection, delayed healing, anesthesia risks, skin sensation changes, injury to structures including nerves, blood vessels, and muscles which may be temporary or permanent, allergies to tape, suture materials and glues, blood products, topical preparations or injected agents, skin contour irregularities, skin discoloration and swelling, deep vein thrombosis, cardiac and pulmonary complications, pain, which may persist, fluid accumulation, wrinkling of the skin over the expander, changes in nipple or breast sensation, expander leakage or rupture, faulty position of the expander, persistent pain, formation of tight scar tissue around the expander (capsular contracture), possible need for revisional surgery or staged procedures.     Electronically signed by: Honora Seip, PA-C 02/02/2024 5:00 PM

## 2024-02-02 NOTE — Progress Notes (Signed)
 Patient ID: Amber Ross, female    DOB: [birth year], 68 y.o.   MRN: [id number]  Chief Complaint  Patient presents with   Pre-op Exam      ICD-10-CM   1. Malignant neoplasm of upper-inner quadrant of left breast in female, estrogen receptor positive (HCC)  C50.212    Z17.0        History of Present Illness: Amber Ross is a 68 y.o.  female  with a history of left breast cancer with previous partial mastectomy and radiation.  She presents for preoperative evaluation for upcoming procedure, left mastectomy with placement of tissue expander and Flex HD, scheduled for 02/16/2024 with Dr. Lowery and Dr. Curvin.  The patient has not had problems with anesthesia.  She reports 20-pack-year smoking history, but quit 34 years ago.  She denies any varicosities, inflammatory bowel disease, or significant cardiac disease.  She denies any personal or family history of blood clots or clotting disorder.  She understands the risks of implant-based reconstruction and would like to proceed.  Her goal is simply for symmetry with contralateral side.  She will hold all of her vitamins and supplements for 1 week prior to surgery.  She will also hold her Adderall day of surgery.  She does report that she will take ASA 81 mg occasionally (self-directed), but understands the importance of holding it at least 7 days prior to surgery.  Summary of Previous Visit: She was seen for consult by Dr. Lowery 01/23/2024.  She had previously had oncoplastic right breast reduction in 2020 given her history of left partial mastectomy.  Recent imaging noted new lesion left breast, biopsy-proven malignancy.  Plan is for unilateral mastectomy with tissue expander reconstruction.  She will also have a right mastopexy at time of left breast implant exchange.  Risks of surgery discussed with patient, particular given history of radiation.  Understanding and agreeable to proceed.  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  Discussed 2 weeks FMLA/STD.  Will extend if needed.  PMH Significant for: Left breast cancer with previous partial mastectomy and right oncoplastic reduction for symmetry, postoperative breast asymmetry, ADHD, HLD, GERD.   Past Medical History: Allergies: No Known Allergies  Current Medications:  Current Outpatient Medications:    amphetamine-dextroamphetamine (ADDERALL) 10 MG tablet, Take 2 tablets by mouth in the morning, and 1 tablet by mouth in the evening., Disp: , Rfl:    atorvastatin  (LIPITOR) 40 MG tablet, Take 1 tablet (40 mg total) by mouth daily., Disp: 30 tablet, Rfl: 0   cephALEXin  (KEFLEX ) 500 MG capsule, Take 1 capsule (500 mg total) by mouth 4 (four) times daily for 3 days., Disp: 12 capsule, Rfl: 0   Cholecalciferol (VITAMIN D3) 125 MCG (5000 UT) CAPS, Take by mouth., Disp: , Rfl:    diazepam (VALIUM) 2 MG tablet, Take 1 tablet (2 mg total) by mouth every 12 (twelve) hours as needed for muscle spasms., Disp: 20 tablet, Rfl: 0   lisinopril-hydrochlorothiazide (ZESTORETIC) 20-12.5 MG tablet, Take 1 tablet by mouth daily., Disp: , Rfl:    Multiple Vitamins-Minerals (HAIR/SKIN/NAILS) CAPS, Take by mouth., Disp: , Rfl:    omeprazole (PRILOSEC) 20 MG capsule, Take 20 mg by mouth daily., Disp: , Rfl:    ondansetron  (ZOFRAN -ODT) 4 MG disintegrating tablet, Take 1 tablet (4 mg total) by mouth every 8 (eight) hours as needed for nausea or vomiting., Disp: 20 tablet, Rfl: 0   oxyCODONE  (ROXICODONE ) 5 MG immediate release tablet, Take 1 tablet (5  mg total) by mouth every 8 (eight) hours as needed for up to 5 days for severe pain (pain score 7-10)., Disp: 15 tablet, Rfl: 0   venlafaxine XR (EFFEXOR-XR) 150 MG 24 hr capsule, 1 capsule with food, Disp: , Rfl:    vitamin B-12 (CYANOCOBALAMIN) 1000 MCG tablet, Take 1,000 mcg by mouth daily., Disp: , Rfl:    metoprolol  tartrate (LOPRESSOR ) 100 MG tablet, Tale 1 tablet (100 mg total) two hours prior to CT scan (Patient not taking:  Reported on 01/23/2024), Disp: 1 tablet, Rfl: 0  Past Medical Problems: Past Medical History:  Diagnosis Date   ADHD    Agatston coronary artery calcium  score less than 100    coronary CTA showed minimal plaque in the pLAD 07/2020   Aortic atherosclerosis    Atrophic vaginitis    Breast cancer (HCC)    left breast cancer 20 yrs ago   Cough    COVID 05/17/2019   Depression    Elevated cholesterol    Elevated cholesterol    Estrogen deficiency    Family history of breast cancer    Gastric reflux    History of breast cancer    Hypertension    OSA on CPAP    Osteopenia    Personal history of radiation therapy    PFO (patent foramen ovale)    small and noted on cardiac CTA   Psoriasis    SOB (shortness of breath)     Past Surgical History: Past Surgical History:  Procedure Laterality Date   BREAST BIOPSY     BREAST BIOPSY Left 01/13/2024   US  LT BREAST BX W LOC DEV 1ST LESION IMG BX SPEC US  GUIDE 01/13/2024 [facility]AMMOGR[facility]   BREAST LUMPECTOMY Left    BREAST REDUCTION SURGERY Right 09/28/2018   Procedure: MAMMARY REDUCTION  (BREAST);  Surgeon: Lowery Estefana RAMAN, DO;  Location: [facility];  Service: Plastics;  Laterality: Right;   CESAREAN SECTION     FACIAL COSMETIC SURGERY     MASTOPEXY Left 09/28/2018   Procedure: MASTOPEXY;  Surgeon: Lowery Estefana RAMAN, DO;  Location: [facility];  Service: Plastics;  Laterality: Left;   REDUCTION MAMMAPLASTY Bilateral     Social History: Social History   Socioeconomic History   Marital status: Divorced    Spouse name: Not on file   Number of children: Not on file   Years of education: Not on file   Highest education level: Not on file  Occupational History   Occupation: Mortgage loan processor   Tobacco Use   Smoking status: Former    Current packs/day: 0.00    Average packs/day: 0.3 packs/day for 15.0 years (3.8 ttl pk-yrs)    Types: Cigarettes    Start date: 04/22/1974    Quit date:  04/22/1989    Years since quitting: 34.8   Smokeless tobacco: Never  Substance and Sexual Activity   Alcohol use: Yes    Comment: 1-2 times per month   Drug use: No   Sexual activity: Not on file  Other Topics Concern   Not on file  Social History Narrative   Not on file   Social Drivers of Health   Financial Resource Strain: Not on file  Food Insecurity: No Food Insecurity (01/21/2024)   Hunger Vital Sign    Worried About Running Out of Food in the Last Year: Never true    Ran Out of Food in the Last Year: Never true  Transportation Needs: No Transportation Needs (  01/21/2024)   PRAPARE - Administrator, Civil Service (Medical): No    Lack of Transportation (Non-Medical): No  Physical Activity: Not on file  Stress: Not on file  Social Connections: Not on file  Intimate Partner Violence: Not At Risk (01/21/2024)   Humiliation, Afraid, Rape, and Kick questionnaire    Fear of Current or Ex-Partner: No    Emotionally Abused: No    Physically Abused: No    Sexually Abused: No    Family History: Family History  Problem Relation Age of Onset   Dementia Mother    Fibroids Mother    Breast cancer Maternal Aunt    Lung cancer Maternal Aunt    Cancer Maternal Uncle        Nasal   Stroke Maternal Uncle    Breast cancer Half-Sister    Cancer Half-Sister        NOS   Cancer Half-Sister        NOS    Review of Systems: ROS Denies any recent chest pain, difficulty breathing, leg swelling, fevers.  Physical Exam: Vital Signs BP (!) 142/82 (BP Location: Left Arm, Patient Position: Sitting, Cuff Size: Normal)   Pulse 85   Ht 5' 7 (1.702 m)   Wt 166 lb 9.6 oz (75.6 kg)   SpO2 100%   BMI 26.09 kg/m   Physical Exam Constitutional:      General: Not in acute distress.    Appearance: Normal appearance. Not ill-appearing.  HENT:     Head: Normocephalic and atraumatic.  Eyes:     Pupils: Pupils are equal, round. Cardiovascular:     Rate and Rhythm: Normal  rate.    Pulses: Normal pulses.  Pulmonary:     Effort: No respiratory distress or increased work of breathing.  Speaks in full sentences. Abdominal:     General: Abdomen is flat. No distension.   Musculoskeletal: Normal range of motion. No lower extremity swelling or edema.  Spider veins, but no varicosities. Skin:    General: Skin is warm and dry.     Findings: No erythema or rash.  Neurological:     Mental Status: Alert and oriented to person, place, and time.  Psychiatric:        Mood and Affect: Mood normal.        Behavior: Behavior normal.    Assessment/Plan: The patient is scheduled for left mastectomy with placement of tissue expander and Flex HD with Dr. Lowery.  Risks, benefits, and alternatives of procedure discussed, questions answered and consent obtained.    Smoking Status: Former smoker, quit 34 years ago.  Last Mammogram: 12/2023; Results: Biopsy-proven malignancy.  Caprini Score: 8; Risk Factors include: Age, breast cancer, COPD, BMI greater than 25, and length of planned surgery. Recommendation for mechanical and possibly pharmacological prophylaxis.  Will discuss with surgeon and prescribe Lovenox if deemed indicated.  Encourage early ambulation.   Pictures obtained: 01/23/2024  Post-op Rx sent to pharmacy: Oxycodone , Zofran , Keflex , Valium.  Patient was provided with the General Surgical Risk consent document and Pain Medication Agreement prior to their appointment.  They had adequate time to read through the risk consent documents and Pain Medication Agreement. We also discussed them in person together during this preop appointment. All of their questions were answered to their satisfaction.  Recommended calling if they have any further questions.  Risk consent form and Pain Medication Agreement to be scanned into patient's chart.  The risks that can be encountered with and after  placement of a breast expander placement were discussed and include the following  but not limited to these: bleeding, infection, delayed healing, anesthesia risks, skin sensation changes, injury to structures including nerves, blood vessels, and muscles which may be temporary or permanent, allergies to tape, suture materials and glues, blood products, topical preparations or injected agents, skin contour irregularities, skin discoloration and swelling, deep vein thrombosis, cardiac and pulmonary complications, pain, which may persist, fluid accumulation, wrinkling of the skin over the expander, changes in nipple or breast sensation, expander leakage or rupture, faulty position of the expander, persistent pain, formation of tight scar tissue around the expander (capsular contracture), possible need for revisional surgery or staged procedures.     Electronically signed by: Honora Seip, PA-C 02/02/2024 5:00 PM

## 2024-02-06 DIAGNOSIS — Z719 Counseling, unspecified: Secondary | ICD-10-CM

## 2024-02-09 NOTE — Pre-Procedure Instructions (Signed)
 Surgical Instructions   Your procedure is scheduled on February 16, 2024. Report to [facility] Main Entrance A at 11:00 A.M., then check in with the Admitting office. Any questions or running late day of surgery: call [phone]  Questions prior to your surgery date: call [phone], Monday-Friday, 8am-4pm. If you experience any cold or flu symptoms such as cough, fever, chills, shortness of breath, etc. between now and your scheduled surgery, please notify us  at the above number.     Remember:  Do not eat after midnight the night before your surgery   You may drink clear liquids until 10:00 AM the morning of your surgery.   Clear liquids allowed are: Water, Non-Citrus Juices (without pulp), Carbonated Beverages, Clear Tea (no milk, honey, etc.), Black Coffee Only (NO MILK, CREAM OR POWDERED CREAMER of any kind), and Gatorade.    Take these medicines the morning of surgery with A SIP OF WATER: atorvastatin  (LIPITOR)  omeprazole (PRILOSEC)  venlafaxine XR (EFFEXOR-XR)    May take these medicines IF NEEDED: ondansetron  (ZOFRAN -ODT)    One week prior to surgery, STOP taking any Aspirin (unless otherwise instructed by your surgeon) Aleve, Naproxen, Ibuprofen, Motrin, Advil, Goody's, BC's, all herbal medications, fish oil, and non-prescription vitamins.                     Do NOT Smoke (Tobacco/Vaping) for 24 hours prior to your procedure.  If you use a CPAP at night, you may bring your mask/headgear for your overnight stay.   You will be asked to remove any contacts, glasses, piercing's, hearing aid's, dentures/partials prior to surgery. Please bring cases for these items if needed.    Patients discharged the day of surgery will not be allowed to drive home, and someone needs to stay with them for 24 hours.  SURGICAL WAITING ROOM VISITATION Patients may have no more than 2 support people in the waiting area - these visitors may rotate.   Pre-op nurse will coordinate an  appropriate time for 1 ADULT support person, who may not rotate, to accompany patient in pre-op.  Children under the age of 25 must have an adult with them who is not the patient and must remain in the main waiting area with an adult.  If the patient needs to stay at the hospital during part of their recovery, the visitor guidelines for inpatient rooms apply.  Please refer to the [facility] website for the visitor guidelines for any additional information.   If you received a COVID test during your pre-op visit  it is requested that you wear a mask when out in public, stay away from anyone that may not be feeling well and notify your surgeon if you develop symptoms. If you have been in contact with anyone that has tested positive in the last 10 days please notify you surgeon.      Pre-operative CHG Bathing Instructions   You can play a key role in reducing the risk of infection after surgery. Your skin needs to be as free of germs as possible. You can reduce the number of germs on your skin by washing with CHG (chlorhexidine gluconate) soap before surgery. CHG is an antiseptic soap that kills germs and continues to kill germs even after washing.   DO NOT use if you have an allergy to chlorhexidine/CHG or antibacterial soaps. If your skin becomes reddened or irritated, stop using the CHG and notify one of our RNs at [phone].  TAKE A SHOWER THE NIGHT BEFORE SURGERY   Please keep in mind the following:  DO NOT shave, including legs and underarms, 48 hours prior to surgery.   You may shave your face before/day of surgery.  Place clean sheets on your bed the night before surgery Use a clean washcloth (not used since being washed) for shower. DO NOT sleep with pet's night before surgery.  CHG Shower Instructions:  Wash your face and private area with normal soap. If you choose to wash your hair, wash first with your normal shampoo.  After you use shampoo/soap, rinse your  hair and body thoroughly to remove shampoo/soap residue.  Turn the water OFF and apply half the bottle of CHG soap to a CLEAN washcloth.  Apply CHG soap ONLY FROM YOUR NECK DOWN TO YOUR TOES (washing for 3-5 minutes)  DO NOT use CHG soap on face, private areas, open wounds, or sores.  Pay special attention to the area where your surgery is being performed.  If you are having back surgery, having someone wash your back for you may be helpful. Wait 2 minutes after CHG soap is applied, then you may rinse off the CHG soap.  Pat dry with a clean towel  Put on clean pajamas    Additional instructions for the day of surgery: If you choose, you may shower the morning of surgery with an antibacterial soap.  DO NOT APPLY any lotions, deodorants, cologne, or perfumes.   Do not wear jewelry or makeup Do not wear nail polish, gel polish, artificial nails, or any other type of covering on natural nails (fingers and toes) Do not bring valuables to the hospital. [facility] is not responsible for valuables/personal belongings. Put on clean/comfortable clothes.  Please brush your teeth.  Ask your nurse before applying any prescription medications to the skin.

## 2024-02-10 ENCOUNTER — Encounter (HOSPITAL_COMMUNITY)
Admission: RE | Admit: 2024-02-10 | Discharge: 2024-02-10 | Disposition: A | Discharge status: Home / Self Care | Source: Ambulatory Visit | Attending: General Surgery | Admitting: General Surgery

## 2024-02-10 ENCOUNTER — Other Ambulatory Visit: Payer: Self-pay

## 2024-02-10 ENCOUNTER — Encounter (HOSPITAL_COMMUNITY): Payer: Self-pay

## 2024-02-10 VITALS — BP 156/91 | HR 82 | Temp 98.0°F | Resp 17 | Ht 67.0 in | Wt 166.0 lb

## 2024-02-10 DIAGNOSIS — Z01818 Encounter for other preprocedural examination: Secondary | ICD-10-CM | POA: Diagnosis present

## 2024-02-10 DIAGNOSIS — I2510 Atherosclerotic heart disease of native coronary artery without angina pectoris: Secondary | ICD-10-CM | POA: Diagnosis not present

## 2024-02-10 DIAGNOSIS — Z01810 Encounter for preprocedural cardiovascular examination: Secondary | ICD-10-CM | POA: Diagnosis not present

## 2024-02-10 HISTORY — DX: Atherosclerotic heart disease of native coronary artery without angina pectoris: I25.10

## 2024-02-10 HISTORY — DX: Sleep apnea, unspecified: G47.30

## 2024-02-10 NOTE — Progress Notes (Signed)
 PCP - Dr. Joen Gentry Cardiologist - Dr. Wilbert Bihari - last office visit 07/11/2020  PPM/ICD - Denies Device Orders - n/a Rep Notified - n/a  Chest x-ray - n/a EKG - 02/10/2024 Stress Test - Denies ECHO - 08/07/2020 Cardiac Cath - Denies CT Coronary - 08/10/2020  Sleep Study - Per pt, had sleep study years ago with result of mild OSA, but no CPAP needed  No DM  Last dose of GLP1 agonist- n/a GLP1 instructions: n/a  Blood Thinner Instructions: n/a Aspirin Instructions: n/a  ERAS Protcol - Clear liquids until 1000 morning of surgery PRE-SURGERY Ensure or G2- n/a  COVID TEST- n/a   Anesthesia review: Yes. Hx of HTN, PFO and mild OSA. Pt denies any cardiopulmonary symptoms.  Patient denies shortness of breath, fever, cough and chest pain at PAT appointment. Pt denies any respiratory illness/infection in the last two months   All instructions explained to the patient, with a verbal understanding of the material. Patient agrees to go over the instructions while at home for a better understanding. Patient also instructed to self quarantine after being tested for COVID-19. The opportunity to ask questions was provided.

## 2024-02-16 ENCOUNTER — Encounter (HOSPITAL_COMMUNITY): Payer: Self-pay | Admitting: General Surgery

## 2024-02-16 ENCOUNTER — Ambulatory Visit (HOSPITAL_COMMUNITY): Payer: Self-pay | Admitting: Physician Assistant

## 2024-02-16 ENCOUNTER — Ambulatory Visit (HOSPITAL_COMMUNITY): Admitting: Anesthesiology

## 2024-02-16 ENCOUNTER — Encounter (HOSPITAL_COMMUNITY)
Admission: RE | Disposition: A | Discharge status: Home / Self Care | Payer: Self-pay | Source: Home / Self Care | Attending: General Surgery

## 2024-02-16 ENCOUNTER — Ambulatory Visit (HOSPITAL_COMMUNITY)
Admission: RE | Admit: 2024-02-16 | Discharge: 2024-02-17 | Disposition: A | Discharge status: Home / Self Care | Attending: General Surgery | Admitting: General Surgery

## 2024-02-16 ENCOUNTER — Ambulatory Visit (HOSPITAL_COMMUNITY)
Admission: RE | Admit: 2024-02-16 | Discharge: 2024-02-16 | Disposition: A | Discharge status: Home / Self Care | Source: Ambulatory Visit | Attending: General Surgery | Admitting: General Surgery

## 2024-02-16 ENCOUNTER — Other Ambulatory Visit: Payer: Self-pay

## 2024-02-16 DIAGNOSIS — E7800 Pure hypercholesterolemia, unspecified: Secondary | ICD-10-CM | POA: Insufficient documentation

## 2024-02-16 DIAGNOSIS — Z1722 Progesterone receptor negative status: Secondary | ICD-10-CM | POA: Insufficient documentation

## 2024-02-16 DIAGNOSIS — C50912 Malignant neoplasm of unspecified site of left female breast: Secondary | ICD-10-CM

## 2024-02-16 DIAGNOSIS — Z923 Personal history of irradiation: Secondary | ICD-10-CM | POA: Diagnosis not present

## 2024-02-16 DIAGNOSIS — Z803 Family history of malignant neoplasm of breast: Secondary | ICD-10-CM | POA: Insufficient documentation

## 2024-02-16 DIAGNOSIS — I2510 Atherosclerotic heart disease of native coronary artery without angina pectoris: Secondary | ICD-10-CM | POA: Diagnosis not present

## 2024-02-16 DIAGNOSIS — Z87891 Personal history of nicotine dependence: Secondary | ICD-10-CM | POA: Insufficient documentation

## 2024-02-16 DIAGNOSIS — C50212 Malignant neoplasm of upper-inner quadrant of left female breast: Secondary | ICD-10-CM | POA: Insufficient documentation

## 2024-02-16 DIAGNOSIS — I10 Essential (primary) hypertension: Secondary | ICD-10-CM | POA: Insufficient documentation

## 2024-02-16 DIAGNOSIS — G4733 Obstructive sleep apnea (adult) (pediatric): Secondary | ICD-10-CM | POA: Diagnosis not present

## 2024-02-16 DIAGNOSIS — K219 Gastro-esophageal reflux disease without esophagitis: Secondary | ICD-10-CM | POA: Diagnosis not present

## 2024-02-16 DIAGNOSIS — Z170 Estrogen receptor positive status [ER+]: Secondary | ICD-10-CM | POA: Insufficient documentation

## 2024-02-16 DIAGNOSIS — Z1732 Human epidermal growth factor receptor 2 negative status: Secondary | ICD-10-CM | POA: Diagnosis not present

## 2024-02-16 DIAGNOSIS — Z79899 Other long term (current) drug therapy: Secondary | ICD-10-CM | POA: Diagnosis not present

## 2024-02-16 HISTORY — PX: MASTECTOMY W/ SENTINEL NODE BIOPSY: SHX2001

## 2024-02-16 SURGERY — MASTECTOMY WITH SENTINEL LYMPH NODE BIOPSY
Anesthesia: General | Site: Breast | Laterality: Left

## 2024-02-16 MED ORDER — 0.9 % SODIUM CHLORIDE (POUR BTL) OPTIME
TOPICAL | Status: DC | PRN
  Administered 2024-02-16: 1000 mL

## 2024-02-16 MED ORDER — GABAPENTIN 100 MG PO CAPS
ORAL_CAPSULE | ORAL | Status: AC
  Administered 2024-02-16: 100 mg via ORAL
  Filled 2024-02-16: qty 1

## 2024-02-16 MED ORDER — CEFAZOLIN SODIUM-DEXTROSE 2-4 GM/100ML-% IV SOLN: 2.0000 g | INTRAVENOUS | Status: DC

## 2024-02-16 MED ORDER — SUGAMMADEX SODIUM 200 MG/2ML IV SOLN
INTRAVENOUS | Status: DC | PRN
  Administered 2024-02-16: 50 mg via INTRAVENOUS

## 2024-02-16 MED ORDER — LIDOCAINE HCL (CARDIAC) PF 100 MG/5ML IV SOSY
PREFILLED_SYRINGE | INTRAVENOUS | Status: DC | PRN
  Administered 2024-02-16: 100 mg via INTRAVENOUS

## 2024-02-16 MED ORDER — LIDOCAINE 2% (20 MG/ML) 5 ML SYRINGE
INTRAMUSCULAR | Status: AC
  Filled 2024-02-16: qty 5

## 2024-02-16 MED ORDER — SODIUM CHLORIDE 0.9 % IV SOLN: INTRAVENOUS | Status: DC

## 2024-02-16 MED ORDER — GABAPENTIN 100 MG PO CAPS
100.0000 mg | ORAL_CAPSULE | ORAL | Status: AC
Start: 2024-02-16 — End: 2024-02-16

## 2024-02-16 MED ORDER — ONDANSETRON HCL 4 MG/2ML IJ SOLN
INTRAMUSCULAR | Status: DC | PRN
  Administered 2024-02-16: 4 mg via INTRAVENOUS

## 2024-02-16 MED ORDER — AMPHETAMINE-DEXTROAMPHETAMINE 10 MG PO TABS: 10.0000 mg | ORAL_TABLET | Freq: Two times a day (BID) | ORAL | Status: DC

## 2024-02-16 MED ORDER — HYDROCHLOROTHIAZIDE 12.5 MG PO TABS
12.5000 mg | ORAL_TABLET | Freq: Every day | ORAL | Status: DC
  Administered 2024-02-16: 12.5 mg via ORAL
  Filled 2024-02-16: qty 1

## 2024-02-16 MED ORDER — AMPHETAMINE-DEXTROAMPHETAMINE 10 MG PO TABS
20.0000 mg | ORAL_TABLET | Freq: Every day | ORAL | Status: DC
  Filled 2024-02-16: qty 2

## 2024-02-16 MED ORDER — BUPIVACAINE-EPINEPHRINE (PF) 0.5% -1:200000 IJ SOLN
INTRAMUSCULAR | Status: DC | PRN
  Administered 2024-02-16: 30 mL

## 2024-02-16 MED ORDER — CHLORHEXIDINE GLUCONATE CLOTH 2 % EX PADS: 6.0000 | MEDICATED_PAD | Freq: Once | CUTANEOUS | Status: DC

## 2024-02-16 MED ORDER — ENOXAPARIN SODIUM 30 MG/0.3ML IJ SOSY
30.0000 mg | PREFILLED_SYRINGE | INTRAMUSCULAR | Status: DC
  Administered 2024-02-17: 30 mg via SUBCUTANEOUS
  Filled 2024-02-16: qty 0.3

## 2024-02-16 MED ORDER — ACETAMINOPHEN 500 MG PO TABS: 1000.0000 mg | ORAL_TABLET | ORAL | Status: AC

## 2024-02-16 MED ORDER — ONDANSETRON HCL 4 MG/2ML IJ SOLN: 4.0000 mg | Freq: Four times a day (QID) | INTRAMUSCULAR | Status: DC | PRN

## 2024-02-16 MED ORDER — FENTANYL CITRATE (PF) 100 MCG/2ML IJ SOLN
INTRAMUSCULAR | Status: AC
  Filled 2024-02-16: qty 2

## 2024-02-16 MED ORDER — METHOCARBAMOL 500 MG PO TABS
500.0000 mg | ORAL_TABLET | Freq: Four times a day (QID) | ORAL | Status: DC | PRN
Start: 2024-02-16 — End: 2024-02-17

## 2024-02-16 MED ORDER — PHENYLEPHRINE HCL-NACL 20-0.9 MG/250ML-% IV SOLN
INTRAVENOUS | Status: DC | PRN
  Administered 2024-02-16: 15 ug/min via INTRAVENOUS

## 2024-02-16 MED ORDER — FENTANYL CITRATE (PF) 100 MCG/2ML IJ SOLN: 50.0000 ug | Freq: Once | INTRAMUSCULAR | Status: AC

## 2024-02-16 MED ORDER — MIDAZOLAM HCL (PF) 2 MG/2ML IJ SOLN: 1.0000 mg | Freq: Once | INTRAMUSCULAR | Status: AC

## 2024-02-16 MED ORDER — PROPOFOL 10 MG/ML IV BOLUS
INTRAVENOUS | Status: DC | PRN
  Administered 2024-02-16: 110 mg via INTRAVENOUS

## 2024-02-16 MED ORDER — CEFAZOLIN SODIUM-DEXTROSE 2-4 GM/100ML-% IV SOLN
INTRAVENOUS | Status: AC
  Filled 2024-02-16: qty 100

## 2024-02-16 MED ORDER — ACETAMINOPHEN 500 MG PO TABS
ORAL_TABLET | ORAL | Status: AC
  Administered 2024-02-16: 1000 mg via ORAL
  Filled 2024-02-16: qty 2

## 2024-02-16 MED ORDER — CHLORHEXIDINE GLUCONATE 0.12 % MT SOLN
OROMUCOSAL | Status: AC
  Administered 2024-02-16: 15 mL via OROMUCOSAL
  Filled 2024-02-16: qty 15

## 2024-02-16 MED ORDER — FENTANYL CITRATE (PF) 100 MCG/2ML IJ SOLN
INTRAMUSCULAR | Status: DC | PRN
  Administered 2024-02-16: 100 ug via INTRAVENOUS

## 2024-02-16 MED ORDER — ONDANSETRON HCL 4 MG/2ML IJ SOLN
INTRAMUSCULAR | Status: AC
  Filled 2024-02-16: qty 2

## 2024-02-16 MED ORDER — ROCURONIUM BROMIDE 100 MG/10ML IV SOLN
INTRAVENOUS | Status: DC | PRN
  Administered 2024-02-16: 50 mg via INTRAVENOUS

## 2024-02-16 MED ORDER — FENTANYL CITRATE (PF) 100 MCG/2ML IJ SOLN
INTRAMUSCULAR | Status: AC
  Administered 2024-02-16: 50 ug via INTRAVENOUS
  Filled 2024-02-16: qty 2

## 2024-02-16 MED ORDER — AMPHETAMINE-DEXTROAMPHETAMINE 10 MG PO TABS: 10.0000 mg | ORAL_TABLET | Freq: Every day | ORAL | Status: DC

## 2024-02-16 MED ORDER — ROCURONIUM BROMIDE 10 MG/ML (PF) SYRINGE
PREFILLED_SYRINGE | INTRAVENOUS | Status: AC
  Filled 2024-02-16: qty 10

## 2024-02-16 MED ORDER — MIDAZOLAM HCL 2 MG/2ML IJ SOLN
INTRAMUSCULAR | Status: AC
  Filled 2024-02-16: qty 2

## 2024-02-16 MED ORDER — LISINOPRIL-HYDROCHLOROTHIAZIDE 20-12.5 MG PO TABS: 1.0000 | ORAL_TABLET | Freq: Every day | ORAL | Status: DC

## 2024-02-16 MED ORDER — MIDAZOLAM HCL 2 MG/2ML IJ SOLN
INTRAMUSCULAR | Status: AC
  Administered 2024-02-16: 1 mg via INTRAVENOUS
  Filled 2024-02-16: qty 2

## 2024-02-16 MED ORDER — MIDAZOLAM HCL (PF) 2 MG/2ML IJ SOLN: 0.5000 mg | Freq: Once | INTRAMUSCULAR | Status: DC

## 2024-02-16 MED ORDER — MORPHINE SULFATE (PF) 2 MG/ML IV SOLN: 1.0000 mg | INTRAVENOUS | Status: DC | PRN

## 2024-02-16 MED ORDER — PANTOPRAZOLE SODIUM 40 MG IV SOLR
40.0000 mg | Freq: Every day | INTRAVENOUS | Status: DC
  Administered 2024-02-16: 40 mg via INTRAVENOUS
  Filled 2024-02-16: qty 10

## 2024-02-16 MED ORDER — MIDAZOLAM HCL (PF) 2 MG/2ML IJ SOLN
INTRAMUSCULAR | Status: DC | PRN
  Administered 2024-02-16 (×2): 1 mg via INTRAVENOUS

## 2024-02-16 MED ORDER — LACTATED RINGERS IV SOLN: INTRAVENOUS | Status: DC

## 2024-02-16 MED ORDER — TECHNETIUM TC 99M TILMANOCEPT KIT: 1.0000 | PACK | Freq: Once | INTRAVENOUS | Status: DC | PRN

## 2024-02-16 MED ORDER — ONDANSETRON 4 MG PO TBDP: 4.0000 mg | ORAL_TABLET | Freq: Four times a day (QID) | ORAL | Status: DC | PRN

## 2024-02-16 MED ORDER — OXYCODONE HCL 5 MG PO TABS
5.0000 mg | ORAL_TABLET | ORAL | Status: DC | PRN
  Administered 2024-02-16 – 2024-02-17 (×2): 10 mg via ORAL
  Filled 2024-02-16 (×2): qty 2

## 2024-02-16 MED ORDER — ORAL CARE MOUTH RINSE: 15.0000 mL | Freq: Once | OROMUCOSAL | Status: AC

## 2024-02-16 MED ORDER — LISINOPRIL 20 MG PO TABS
20.0000 mg | ORAL_TABLET | Freq: Every day | ORAL | Status: DC
  Administered 2024-02-16: 20 mg via ORAL
  Filled 2024-02-16: qty 1

## 2024-02-16 MED ORDER — SODIUM CHLORIDE 0.9 % IV SOLN
Freq: Once | INTRAVENOUS | Status: DC
  Filled 2024-02-16: qty 10

## 2024-02-16 MED ORDER — DEXAMETHASONE SOD PHOSPHATE PF 10 MG/ML IJ SOLN
INTRAMUSCULAR | Status: DC | PRN
  Administered 2024-02-16: 10 mg via INTRAVENOUS

## 2024-02-16 MED ORDER — CEFAZOLIN SODIUM-DEXTROSE 2-4 GM/100ML-% IV SOLN
2.0000 g | INTRAVENOUS | Status: AC
  Administered 2024-02-16: 2 g via INTRAVENOUS

## 2024-02-16 MED ORDER — CHLORHEXIDINE GLUCONATE 0.12 % MT SOLN: 15.0000 mL | Freq: Once | OROMUCOSAL | Status: AC

## 2024-02-16 SURGICAL SUPPLY — 63 items
BAG COUNTER SPONGE SURGICOUNT (BAG) ×3 IMPLANT
BAG DECANTER FOR FLEXI CONT (MISCELLANEOUS) ×2 IMPLANT
BINDER BREAST LRG (GAUZE/BANDAGES/DRESSINGS) IMPLANT
BINDER BREAST XLRG (GAUZE/BANDAGES/DRESSINGS) ×1 IMPLANT
BIOPATCH RED 1 DISK 7.0 (GAUZE/BANDAGES/DRESSINGS) ×3 IMPLANT
CANISTER SUCTION 3000ML PPV (SUCTIONS) ×3 IMPLANT
CHLORAPREP W/TINT 26 (MISCELLANEOUS) ×2 IMPLANT
CLIP APPLIE 9.375 MED OPEN (MISCELLANEOUS) ×3 IMPLANT
CNTNR URN SCR LID CUP LEK RST (MISCELLANEOUS) ×2 IMPLANT
COVER PROBE W GEL 5X96 (DRAPES) ×2 IMPLANT
COVER SURGICAL LIGHT HANDLE (MISCELLANEOUS) ×3 IMPLANT
DERMABOND ADVANCED .7 DNX12 (GAUZE/BANDAGES/DRESSINGS) ×3 IMPLANT
DEVICE DSSCT PLSMBLD 3.0S LGHT (MISCELLANEOUS) ×2 IMPLANT
DRAIN CHANNEL 19F RND (DRAIN) ×3 IMPLANT
DRAPE CHEST BREAST 15X10 FENES (DRAPES) ×2 IMPLANT
DRAPE HALF SHEET 40X57 (DRAPES) ×4 IMPLANT
DRAPE SURG 17X23 STRL (DRAPES) ×8 IMPLANT
DRAPE SURG ORHT 6 SPLT 77X108 (DRAPES) ×4 IMPLANT
DRAPE WARM FLUID 44X44 (DRAPES) ×1 IMPLANT
DRSG TEGADERM 4X4.75 (GAUZE/BANDAGES/DRESSINGS) ×2 IMPLANT
ELECT CAUTERY BLADE 6.4 (BLADE) ×2 IMPLANT
ELECTRODE BLDE 4.0 EZ CLN MEGD (MISCELLANEOUS) ×2 IMPLANT
ELECTRODE REM PT RTRN 9FT ADLT (ELECTROSURGICAL) ×3 IMPLANT
EVACUATOR SILICONE 100CC (DRAIN) ×3 IMPLANT
FILTER IN LINE W/DETACHED HOSE (FILTER) ×2 IMPLANT
GAUZE PAD ABD 8X10 STRL (GAUZE/BANDAGES/DRESSINGS) ×3 IMPLANT
GAUZE SPONGE 4X4 12PLY STRL (GAUZE/BANDAGES/DRESSINGS) ×2 IMPLANT
GLOVE BIO SURGEON STRL SZ 6.5 (GLOVE) ×2 IMPLANT
GLOVE BIO SURGEON STRL SZ7.5 (GLOVE) ×2 IMPLANT
GOWN STRL REUS W/ TWL LRG LVL3 (GOWN DISPOSABLE) ×6 IMPLANT
KIT BASIN OR (CUSTOM PROCEDURE TRAY) ×3 IMPLANT
KIT TURNOVER KIT B (KITS) ×3 IMPLANT
LIGHT WAVEGUIDE WIDE FLAT (MISCELLANEOUS) IMPLANT
NDL 18GX1X1/2 (RX/OR ONLY) (NEEDLE) IMPLANT
NDL FILTER BLUNT 18X1 1/2 (NEEDLE) IMPLANT
NDL HYPO 25GX1X1/2 BEV (NEEDLE) IMPLANT
NEEDLE 18GX1X1/2 (RX/OR ONLY) (NEEDLE) IMPLANT
NEEDLE FILTER BLUNT 18X1 1/2 (NEEDLE) IMPLANT
NEEDLE HYPO 25GX1X1/2 BEV (NEEDLE) IMPLANT
PACK GENERAL/GYN (CUSTOM PROCEDURE TRAY) ×3 IMPLANT
PAD ARMBOARD POSITIONER FOAM (MISCELLANEOUS) ×3 IMPLANT
PENCIL SMOKE EVACUATOR (MISCELLANEOUS) ×2 IMPLANT
PIN SAFETY STERILE (MISCELLANEOUS) ×2 IMPLANT
SOL PREP POV-IOD 4OZ 10% (MISCELLANEOUS) ×2 IMPLANT
SOLN 0.9% NACL POUR BTL 1000ML (IV SOLUTION) ×4 IMPLANT
SPECIMEN JAR X LARGE (MISCELLANEOUS) ×2 IMPLANT
STAPLER SKIN PROX 35W (STAPLE) IMPLANT
SUT MNCRL AB 4-0 PS2 18 (SUTURE) ×1 IMPLANT
SUT MON AB 5-0 PS2 18 (SUTURE) ×3 IMPLANT
SUT PDS AB 2-0 CT1 27 (SUTURE) IMPLANT
SUT SILK 3 0 SH 30 (SUTURE) ×2 IMPLANT
SUT VIC AB 0 CT1 27XBRD ANBCTR (SUTURE) ×2 IMPLANT
SUT VIC AB 3-0 54X BRD REEL (SUTURE) IMPLANT
SUT VIC AB 3-0 SH 18 (SUTURE) ×2 IMPLANT
SUT VIC AB 3-0 SH 27X BRD (SUTURE) ×3 IMPLANT
SUT VIC AB 4-0 PS2 18 (SUTURE) ×2 IMPLANT
SYR CONTROL 10ML LL (SYRINGE) IMPLANT
TOWEL GREEN STERILE (TOWEL DISPOSABLE) ×3 IMPLANT
TOWEL GREEN STERILE FF (TOWEL DISPOSABLE) ×3 IMPLANT
TRAY FOLEY MTR SLVR 14FR STAT (SET/KITS/TRAYS/PACK) IMPLANT
TUBE CONNECTING 12X1/4 (SUCTIONS) IMPLANT
TUBING INFILTRATION IT-10001 (TUBING) ×2 IMPLANT
TUBING SET GRADUATE ASPIR 12FT (MISCELLANEOUS) ×2 IMPLANT

## 2024-02-16 NOTE — Anesthesia Procedure Notes (Signed)
 Anesthesia Regional Block: Pectoralis block   Pre-Anesthetic Checklist: , timeout performed,  Correct Patient, Correct Site, Correct Laterality,  Correct Procedure, Correct Position, site marked,  Risks and benefits discussed,  Surgical consent,  Pre-op evaluation,  At surgeon's request and post-op pain management  Laterality: Left  Prep: Maximum Sterile Barrier Precautions used, chloraprep       Needles:  Injection technique: Single-shot  Needle Type: Echogenic Needle     Needle Length: 5cm  Needle Gauge: 22     Additional Needles:   Procedures:,,,, ultrasound used (permanent image in chart),,    Narrative:  Start time: 02/16/2024 1:00 PM End time: 02/16/2024 1:05 PM Injection made incrementally with aspirations every 5 mL.  Performed by: Personally  Anesthesiologist: Keneth Lynwood POUR, MD

## 2024-02-16 NOTE — H&P (Signed)
 REFERRING PHYSICIAN: Gudena, Vinay K, MD PROVIDER: DEWARD GARNETTE NULL, MD MRN: [id number] DOB: 27-Feb-1956 Subjective   Chief Complaint: Breast Cancer  History of Present Illness: Amber Ross is a 68 y.o. female who is seen today as an office consultation for evaluation of Breast Cancer  We are asked to see the patient in consultation by Dr. Odean to evaluate her for a new left breast cancer. The patient is a 68 year old white female who recently went for a routine screening mammogram. At that time she was found to have a 2.2 cm mass in the inner aspect of the left breast. The lymph nodes looked normal. The mass was biopsied and came back as a grade 3 invasive ductal cancer that was weakly ER positive and PR negative and HER2 negative with a Ki-67 of 95%. She does have a history of left breast cancer treated with lumpectomy and radiation back in 2001. She does have some hypertension but does not smoke.  Review of Systems: A complete review of systems was obtained from the patient. I have reviewed this information and discussed as appropriate with the patient. See HPI as well for other ROS.  ROS   Medical History: Past Medical History:  Diagnosis Date  GERD (gastroesophageal reflux disease)  History of cancer  Hyperlipidemia  Hypertension   Patient Active Problem List  Diagnosis  ADD (attention deficit disorder)  Agatston coronary artery calcium  score less than 100  Aortic atherosclerosis  Atrophic vaginitis  CAD (coronary artery disease)  Chronic cough  Decreased estrogen level  Diastolic dysfunction  Essential hypertension  Gastroesophageal reflux disease  History of primary malignant neoplasm of breast  Major depression  Malignant neoplasm of upper-inner quadrant of left breast in female, estrogen receptor positive (CMS/HHS-HCC)  Osteopenia  PFO (patent foramen ovale) (HHS-HCC)  Psoriasis  Pure hypercholesterolemia  Recurrent major depressive disorder, in remission ()   Prediabetes  Postoperative breast asymmetry  Upper airway cough syndrome   Past Surgical History:  Procedure Laterality Date  Breast reduction surgery (Right Right 09/28/2018  Mastopexy (Left) Left 09/28/2018  Breast biopsy (Left) Left 01/13/2024  .Breast biopsy  Date Unknown  Breast lumpectomy (Left) Left  Date Unknown  CESAREAN SECTION N/A  Date Unknown  Facial cosmetic surgery  Date Unknown  REDUCTION MAMMAPLASTY BILATERAL Bilateral  Date Unknown    No Known Allergies  Current Outpatient Medications on File Prior to Visit  Medication Sig Dispense Refill  atorvastatin  (LIPITOR) 40 MG tablet Take 40 mg by mouth once daily  dextroamphetamine-amphetamine (ADDERALL) 10 mg tablet Take 10 mg by mouth as directed (Take 2 tablets by mouth in the morning, and 1 tablet by mouth in the evening.)  lisinopriL-hydroCHLOROthiazide (ZESTORETIC) 20-12.5 mg tablet Take 1 tablet by mouth once daily  omeprazole (PRILOSEC) 20 MG DR capsule Take 20 mg by mouth once daily  venlafaxine (EFFEXOR-XR) 150 MG XR capsule Take 150 mg by mouth once daily 1 capsule with food  vit C,E-Zn-coppr-lutein-zeaxan (OCUVITE LUTEIN AND ZEAXANTHIN) 60 mg-13.5 mg- 15 mg-2 mg-6 mg Cap Take 1 capsule by mouth once daily   No current facility-administered medications on file prior to visit.   Family History  Problem Relation Age of Onset  Breast cancer Neg Hx    Social History   Tobacco Use  Smoking Status Never  Smokeless Tobacco Never    Social History   Socioeconomic History  Marital status: Divorced  Tobacco Use  Smoking status: Never  Smokeless tobacco: Never  Vaping Use  Vaping status: Never Used  Substance and Sexual Activity  Alcohol use: Never  Drug use: Never   Social Drivers of Health   Housing Stability: Unknown (01/21/2024)  Housing Stability Vital Sign  Homeless in the Last Year: No   Objective:  There were no vitals filed for this visit.  There is no height or weight on file to  calculate BMI.  Physical Exam Vitals reviewed.  Constitutional:  General: She is not in acute distress. Appearance: Normal appearance.  HENT:  Head: Normocephalic and atraumatic.  Right Ear: External ear normal.  Left Ear: External ear normal.  Nose: Nose normal.  Mouth/Throat:  Mouth: Mucous membranes are moist.  Pharynx: Oropharynx is clear.  Eyes:  General: No scleral icterus. Extraocular Movements: Extraocular movements intact.  Conjunctiva/sclera: Conjunctivae normal.  Pupils: Pupils are equal, round, and reactive to light.  Cardiovascular:  Rate and Rhythm: Normal rate and regular rhythm.  Pulses: Normal pulses.  Heart sounds: Normal heart sounds.  Pulmonary:  Effort: Pulmonary effort is normal. No respiratory distress.  Breath sounds: Normal breath sounds.  Abdominal:  General: Bowel sounds are normal.  Palpations: Abdomen is soft.  Tenderness: There is no abdominal tenderness.  Musculoskeletal:  General: No swelling, tenderness or deformity. Normal range of motion.  Cervical back: Normal range of motion and neck supple.  Skin: General: Skin is warm and dry.  Coloration: Skin is not jaundiced.  Neurological:  General: No focal deficit present.  Mental Status: She is alert and oriented to person, place, and time.  Psychiatric:  Mood and Affect: Mood normal.  Behavior: Behavior normal.     Breast: There is a roughly 2 cm palpable mass in the inner aspect of the left breast. There is no palpable mass in the right breast. There is no palpable axillary, supraclavicular, or cervical lymphadenopathy  Labs, Imaging and Diagnostic Testing:  Assessment and Plan:   Diagnoses and all orders for this visit:  Malignant neoplasm of upper-inner quadrant of left breast in female, estrogen receptor positive (CMS/HHS-HCC)   The patient appears to have a 2.2 cm high-grade cancer in the inner aspect of the left breast. Because of her history of previous radiation I feel her  most appropriate surgery would be a mastectomy and sentinel node biopsy. I have discussed with her in detail the risks and benefits of the operation as well as some of the technical aspects and she understands and wishes to proceed. She would be interested in reconstruction so we will refer her to plastic surgery and coordinate with them. She will also meet with medical and radiation oncology to discuss adjuvant therapy.

## 2024-02-16 NOTE — Anesthesia Procedure Notes (Addendum)
 Procedure Name: Intubation Date/Time: 02/16/2024 1:47 PM  Performed by: Jolynn Mage, CRNAPre-anesthesia Checklist: Patient identified, Patient being monitored, Timeout performed, Emergency Drugs available and Suction available Patient Re-evaluated:Patient Re-evaluated prior to induction Oxygen Delivery Method: Circle System Utilized Preoxygenation: Pre-oxygenation with 100% oxygen Induction Type: IV induction Ventilation: Mask ventilation without difficulty Laryngoscope Size: Miller and 2 Grade View: Grade I Tube type: Oral Tube size: 7.0 mm Number of attempts: 1 Airway Equipment and Method: Stylet Placement Confirmation: ETT inserted through vocal cords under direct vision, positive ETCO2 and breath sounds checked- equal and bilateral Secured at: 21 cm Tube secured with: Tape Dental Injury: Teeth and Oropharynx as per pre-operative assessment

## 2024-02-16 NOTE — Transfer of Care (Signed)
 Immediate Anesthesia Transfer of Care Note  Patient: Amber Ross  Procedure(s) Performed: MASTECTOMY WITH SENTINEL LYMPH NODE BIOPSY (Left: Breast)  Patient Location: PACU  Anesthesia Type:General  Level of Consciousness: awake, alert , and oriented  Airway & Oxygen Therapy: Patient Spontanous Breathing and Patient connected to nasal cannula oxygen  Post-op Assessment: Report given to RN and Post -op Vital signs reviewed and stable  Post vital signs: Reviewed and stable  Last Vitals:  Vitals Value Taken Time  BP 143/86 02/16/24 15:23  Temp    Pulse 83 02/16/24 15:26  Resp 23 02/16/24 15:26  SpO2 98 % 02/16/24 15:26  Vitals shown include unfiled device data.  Last Pain:  Vitals:   02/16/24 1235  TempSrc:   PainSc: 0-No pain         Complications: No notable events documented.

## 2024-02-16 NOTE — Interval H&P Note (Signed)
 History and Physical Interval Note:  02/16/2024 12:44 PM  Amber Ross  has presented today for surgery, with the diagnosis of LEFT BREAST CANCER.  The various methods of treatment have been discussed with the patient and family. After consideration of risks, benefits and other options for treatment, the patient has consented to  Procedure(s) with comments: MASTECTOMY WITH SENTINEL LYMPH NODE BIOPSY (Left) - GEN w/PEC BLOCK LEFT MASTECTOMY SENTINEL NODE BIOPSY RECONSTRUCTION, BREAST (Left) as a surgical intervention.  The patient's history has been reviewed, patient examined, no change in status, stable for surgery.  I have reviewed the patient's chart and labs.  Questions were answered to the patient's satisfaction.     Estefana RAMAN Ryle Buscemi

## 2024-02-16 NOTE — Op Note (Signed)
 02/16/2024  3:07 PM  PATIENT:  Amber Ross  68 y.o. female  PRE-OPERATIVE DIAGNOSIS:  LEFT BREAST CANCER  POST-OPERATIVE DIAGNOSIS:  LEFT BREAST CANCER  PROCEDURE:  Procedure(s) with comments: LEFT MASTECTOMY AND DEEP LEFT AXILLARY SENTINEL LYMPH NODE BIOPSY  SURGEON:  Surgeons and Role:    * Curvin Deward MOULD, MD - Primary  PHYSICIAN ASSISTANT:   ASSISTANTS: none   ANESTHESIA:   general  EBL:  minimal   BLOOD ADMINISTERED:none  DRAINS: (1) Blake drain(s) in the prepectoral space   LOCAL MEDICATIONS USED:  NONE  SPECIMEN:  Source of Specimen:  Left mastectomy and sentinel lymph nodes x 4  DISPOSITION OF SPECIMEN:  PATHOLOGY  COUNTS:  YES  TOURNIQUET:  * No tourniquets in log *  DICTATION: .Dragon Dictation  After informed consent was obtained the patient was brought to the operating room and placed in the supine position on the operating table.  After adequate induction of general anesthesia the patient's bilateral chest, breast, and axillary areas were prepped with ChloraPrep, allowed to dry, and draped in usual sterile manner.  An appropriate timeout was performed.  Earlier in the day the patient underwent injection of 1 mCi of technetium sulfur colloid in the subareolar position on the left.  The neoprobe was set to technetium and there was a good signal in the left axilla.  An elliptical incision was made around the nipple and areola complex and extra skin was taken medially to make sure that we took the area of skin closest to the tumor.  The incision was carried through the skin and subcutaneous tissue sharply with the PlasmaBlade.  Breast hooks were used to elevate the skin flaps anteriorly towards the ceiling.  Then skin flaps were then created by dissecting between the breast tissue and the subcutaneous fat and skin.  This dissection was carried circumferentially all the way to the muscle of the chest wall.  The breast was then removed from the pectoralis muscle with  the pectoralis fascia.  Once this dissection was complete then the entire left breast was removed from the patient.  It was marked with a stitch on the lateral skin and sent to pathology for further evaluation.  The neoprobe was then used to direct sharp dissection with the PlasmaBlade into the deep left axillary space.  I was able to identify 4 lymph nodes with signal.  Each of these was excised sharply with the PlasmaBlade and the surrounding small vessels and lymphatics were controlled with clips.  Ex vivo counts on these nodes ranged from [phone].  These were sent as sentinel nodes numbers 1 through 4.  No other hot or palpable nodes were identified in the left axilla.  Hemostasis was achieved using the Bovie electrocautery.  The wound was then irrigated with saline.  A small stab incision was made near the anterior axillary line inferior to the operative bed.  A tonsil clamp was placed through this opening and used to bring a 19 French round Blake drain into the operative bed.  The drain was curled along the chest wall.  The drain was anchored to the skin with a 3-0 nylon stitch.  Next the superior and inferior skin flaps were grossly reapproximated with interrupted 3-0 Vicryl stitches.  The skin was then closed with a running 4-0 Monocryl subcuticular stitch.  Dermabond dressings and sterile drain dressings were applied.  The patient tolerated the procedure well.  At the end of the case all needle sponge and instrument counts were correct.  The patient was then awakened and taken to recovery in stable condition.  The patient's skin flaps appeared healthy and viable.  PLAN OF CARE: Admit for overnight observation  PATIENT DISPOSITION:  PACU - hemodynamically stable.   Delay start of Pharmacological VTE agent (>24hrs) due to surgical blood loss or risk of bleeding: no

## 2024-02-16 NOTE — Interval H&P Note (Signed)
 History and Physical Interval Note:  02/16/2024 11:57 AM  Amber Ross  has presented today for surgery, with the diagnosis of LEFT BREAST CANCER.  The various methods of treatment have been discussed with the patient and family. After consideration of risks, benefits and other options for treatment, the patient has consented to  Procedure(s) with comments: MASTECTOMY WITH SENTINEL LYMPH NODE BIOPSY (Left) - GEN w/PEC BLOCK LEFT MASTECTOMY SENTINEL NODE BIOPSY RECONSTRUCTION, BREAST (Left) as a surgical intervention.  The patient's history has been reviewed, patient examined, no change in status, stable for surgery.  I have reviewed the patient's chart and labs.  Questions were answered to the patient's satisfaction.     Deward Null III

## 2024-02-16 NOTE — Anesthesia Preprocedure Evaluation (Signed)
 Anesthesia Evaluation  Patient identified by MRN, date of birth, ID band Patient awake    Reviewed: Allergy & Precautions, NPO status , Patient's Chart, lab work & pertinent test results, reviewed documented beta blocker date and time   History of Anesthesia Complications Negative for: history of anesthetic complications  Airway Mallampati: II  TM Distance: >3 FB     Dental no notable dental hx.    Pulmonary sleep apnea , neg COPD, former smoker   breath sounds clear to auscultation       Cardiovascular hypertension, + CAD   Rhythm:Regular Rate:Normal  IMPRESSIONS     1. Left ventricular ejection fraction, by estimation, is 60 to 65%. The  left ventricle has normal function. The left ventricle has no regional  wall motion abnormalities. Left ventricular diastolic parameters are  consistent with Grade I diastolic  dysfunction (impaired relaxation).   2. Right ventricular systolic function is normal. The right ventricular  size is normal. There is normal pulmonary artery systolic pressure. The  estimated right ventricular systolic pressure is 17.1 mmHg.   3. The mitral valve is normal in structure. Trivial mitral valve  regurgitation. No evidence of mitral stenosis.   4. The aortic valve is tricuspid. Aortic valve regurgitation is trivial.  No aortic stenosis is present.   5. The inferior vena cava is normal in size with greater than 50%  respiratory variability, suggesting right atrial pressure of 3 mmHg.      Neuro/Psych neg Seizures PSYCHIATRIC DISORDERS         GI/Hepatic ,GERD  ,,(+) neg Cirrhosis        Endo/Other    Renal/GU Renal disease     Musculoskeletal   Abdominal   Peds  Hematology   Anesthesia Other Findings   Reproductive/Obstetrics                              Anesthesia Physical Anesthesia Plan  ASA: 3  Anesthesia Plan: General   Post-op Pain Management:  Regional block*   Induction: Intravenous  PONV Risk Score and Plan: 2 and Ondansetron  and Dexamethasone   Airway Management Planned: Oral ETT  Additional Equipment:   Intra-op Plan:   Post-operative Plan: Extubation in OR  Informed Consent: I have reviewed the patients History and Physical, chart, labs and discussed the procedure including the risks, benefits and alternatives for the proposed anesthesia with the patient or authorized representative who has indicated his/her understanding and acceptance.     Dental advisory given  Plan Discussed with: CRNA  Anesthesia Plan Comments:          Anesthesia Quick Evaluation

## 2024-02-17 ENCOUNTER — Encounter (HOSPITAL_COMMUNITY): Payer: Self-pay | Admitting: General Surgery

## 2024-02-17 DIAGNOSIS — C50212 Malignant neoplasm of upper-inner quadrant of left female breast: Secondary | ICD-10-CM | POA: Diagnosis not present

## 2024-02-17 NOTE — Anesthesia Postprocedure Evaluation (Signed)
 Anesthesia Post Note  Patient: Amber Ross  Procedure(s) Performed: MASTECTOMY WITH SENTINEL LYMPH NODE BIOPSY (Left: Breast)     Patient location during evaluation: PACU Anesthesia Type: General Level of consciousness: awake and alert Pain management: pain level controlled Vital Signs Assessment: post-procedure vital signs reviewed and stable Respiratory status: spontaneous breathing, nonlabored ventilation, respiratory function stable and patient connected to nasal cannula oxygen Cardiovascular status: blood pressure returned to baseline and stable Postop Assessment: no apparent nausea or vomiting Anesthetic complications: no   No notable events documented.  Last Vitals:  Vitals:   02/16/24 2135 02/17/24 0704  BP: (!) 145/85 127/74  Pulse:  79  Resp:  17  Temp:  36.7 C  SpO2:  98%    Last Pain:  Vitals:   02/17/24 0704  TempSrc: Oral  PainSc:                  Lynwood MARLA Cornea

## 2024-02-17 NOTE — Progress Notes (Signed)
 1 Day Post-Op   Subjective/Chief Complaint: No complaints   Objective: Vital signs in last 24 hours: Temp:  [98 F (36.7 C)-98.3 F (36.8 C)] 98 F (36.7 C) (10/28 0704) Pulse Rate:  [77-100] 79 (10/28 0704) Resp:  [16-28] 17 (10/28 0704) BP: (111-152)/(67-97) 127/74 (10/28 0704) SpO2:  [89 %-100 %] 98 % (10/28 0704) Weight:  [75.3 kg-75.8 kg] 75.8 kg (10/27 2100)    Intake/Output from previous day: 10/27 0701 - 10/28 0700 In: 800 [I.V.:700; IV Piggyback:100] Out: 90 [Drains:65; Blood:25] Intake/Output this shift: No intake/output data recorded.  General appearance: alert and cooperative Resp: clear to auscultation bilaterally Chest wall: skin flaps look good Cardio: regular rate and rhythm GI: soft, non-tender; bowel sounds normal; no masses,  no organomegaly  Lab Results:  No results for input(s): WBC, HGB, HCT, PLT in the last 72 hours. BMET No results for input(s): NA, K, CL, CO2, GLUCOSE, BUN, CREATININE, CALCIUM  in the last 72 hours. PT/INR No results for input(s): LABPROT, INR in the last 72 hours. ABG No results for input(s): PHART, HCO3 in the last 72 hours.  Invalid input(s): PCO2, PO2  Studies/Results: NM Sentinel Node Inj-No Rpt (Breast) Result Date: 02/16/2024 Lymphoseek was injected by the Nuclear Medicine Technologist for sentinel lymph node localization.    Anti-infectives: Anti-infectives (From admission, onward)    Start     Dose/Rate Route Frequency Ordered Stop   02/17/24 0600  ceFAZolin  (ANCEF ) IVPB 2g/100 mL premix  Status:  Discontinued        2 g 200 mL/hr over 30 Minutes Intravenous On call to O.R. 02/16/24 1329 02/16/24 1339   02/16/24 1330  ceFAZolin  1 g / gentamicin 80 mg in NS 500 mL surgical irrigation  Status:  Discontinued         Irrigation  Once 02/16/24 1321 02/16/24 1937   02/16/24 1145  ceFAZolin  (ANCEF ) IVPB 2g/100 mL premix        2 g 200 mL/hr over 30 Minutes Intravenous On call to  O.R. 02/16/24 1130 02/16/24 1420   02/16/24 1145  ceFAZolin  (ANCEF ) IVPB 2g/100 mL premix  Status:  Discontinued        2 g 200 mL/hr over 30 Minutes Intravenous On call to O.R. 02/16/24 1130 02/16/24 1142   02/16/24 1138  ceFAZolin  (ANCEF ) 2-4 GM/100ML-% IVPB       Note to Pharmacy: Larina Mays A: cabinet override      02/16/24 1138 02/16/24 1354       Assessment/Plan: s/p Procedure(s) with comments: MASTECTOMY WITH SENTINEL LYMPH NODE BIOPSY (Left) - GEN w/PEC BLOCK LEFT MASTECTOMY SENTINEL NODE BIOPSY Advance diet Discharge Teach pt drain care  LOS: 0 days    Deward Null III 02/17/2024

## 2024-02-17 NOTE — Plan of Care (Signed)

## 2024-02-17 NOTE — Discharge Summary (Signed)
 Physician Discharge Summary  Patient ID: Amber Ross MRN: [id number] DOB/AGE: 1955/08/11 68 y.o.  Admit date: 02/16/2024 Discharge date: 02/17/2024  Admission Diagnoses:  Discharge Diagnoses:  Principal Problem:   Cancer of left female breast [facility])   Discharged Condition: good  Hospital Course: the pt underwent left mastectomy. She tolerated surgery well. On pod 1 she was ready for d/c  Consults: None  Significant Diagnostic Studies: none  Treatments: surgery: as above  Discharge Exam: Blood pressure 127/74, pulse 79, temperature 98 F (36.7 C), temperature source Oral, resp. rate 17, height 5' 7 (1.702 m), weight 75.8 kg, SpO2 98%. Chest wall: skin flaps look good  Disposition: Discharge disposition: 01-Home or Self Care       Discharge Instructions     Call MD for:  difficulty breathing, headache or visual disturbances   Complete by: As directed    Call MD for:  extreme fatigue   Complete by: As directed    Call MD for:  hives   Complete by: As directed    Call MD for:  persistant dizziness or light-headedness   Complete by: As directed    Call MD for:  persistant nausea and vomiting   Complete by: As directed    Call MD for:  redness, tenderness, or signs of infection (pain, swelling, redness, odor or green/yellow discharge around incision site)   Complete by: As directed    Call MD for:  severe uncontrolled pain   Complete by: As directed    Call MD for:  temperature >100.4   Complete by: As directed    Diet - low sodium heart healthy   Complete by: As directed    Discharge instructions   Complete by: As directed    Diet as tolerated. No overhead activity with left arm. Empty drain, record output, recharge bulb daily. Wear compression bra most of the time for the first couple weeks   Increase activity slowly   Complete by: As directed    No wound care   Complete by: As directed       Allergies as of 02/17/2024   No Known Allergies       Medication List     TAKE these medications    amphetamine-dextroamphetamine 10 MG tablet Commonly known as: ADDERALL Take 2 tablets by mouth in the morning, and 1 tablet by mouth in the evening.   atorvastatin  40 MG tablet Commonly known as: LIPITOR Take 1 tablet (40 mg total) by mouth daily.   cyanocobalamin 1000 MCG tablet Commonly known as: VITAMIN B12 Take 1,000 mcg by mouth daily.   diazepam 2 MG tablet Commonly known as: Valium Take 1 tablet (2 mg total) by mouth every 12 (twelve) hours as needed for muscle spasms.   HAIR/SKIN/NAILS PO Take 2 each by mouth daily. Gummies   lisinopril-hydrochlorothiazide 20-12.5 MG tablet Commonly known as: ZESTORETIC Take 1 tablet by mouth daily.   omeprazole 20 MG capsule Commonly known as: PRILOSEC Take 20 mg by mouth daily.   ondansetron  4 MG disintegrating tablet Commonly known as: ZOFRAN -ODT Take 1 tablet (4 mg total) by mouth every 8 (eight) hours as needed for nausea or vomiting.   TURMERIC CURCUMIN PO Take 2 each by mouth daily. Gummies   venlafaxine XR 150 MG 24 hr capsule Commonly known as: EFFEXOR-XR Take 150 mg by mouth daily with breakfast. 1 ca   Vitamin D3 125 MCG (5000 UT) Caps Take 5,000 Units by mouth daily.        Follow-up  Information     Curvin Mt III, MD Follow up in 2 week(s).   Specialty: General Surgery Contact information: [address] [place] [address] [place] [address] [phone]                 Signed: Mt Curvin III 02/17/2024, 7:44 AM

## 2024-02-18 ENCOUNTER — Ambulatory Visit: Payer: Self-pay | Admitting: General Surgery

## 2024-02-24 ENCOUNTER — Encounter: Admitting: Plastic Surgery

## 2024-03-01 NOTE — Assessment & Plan Note (Signed)
 01/13/2024:History of left breast cancer: 2001: Lumpectomy and radiation Screening mammogram detected left breast mass by ultrasound 2.2 cm at 9 o'clock position, axilla negative biopsy: Grade 3 IDC ER 85%, PR 0%, HER2 equivalent FISH pending, Ki-67 95%   Recommendations: 1.  02/16/2024: Left mastectomy: Grade 3 IDC 3.6 cm, margins negative, 0/9 lymph nodes, ER 85%, PR 0%, HER2 2+ by IHC, FISH negative ratio 1.3, Ki-67 95% 2.  Recommend Oncotype DX testing 3.  With mastectomy and prior radiation, probably no role of additional adjuvant radiation 4. Adjuvant antiestrogen therapy  Return to clinic based upon Oncotype DX test result

## 2024-03-01 NOTE — Progress Notes (Signed)
 Pt presented today for removal of JP drain from left breast. Patient s/p left masty by Dr. ALMETA on 02/16/2024. No signs of infection. Incision was prepped with alcohol. Drain removed successfully, and pt tolerated well. Reinforced wound with 4x4 gauze and compression tape. Let pt know it is safe to shower and change dressing daily. Advised pt to look for any signs of infection: swelling, increased redness, odorous drainage, major pain, or fever/chills and to call with any concerns or future questions. Pt verbalized understanding.   Drain amounts : Date --02/27/24---26ml Date --02/28/24--23 ml Date--02/29/24---36ml  Date---03/01/24---60ml   Next appointment on 03/09/2024 at 940 am with Dr. ALMETA  for Post Op appointment.     HM

## 2024-03-02 ENCOUNTER — Telehealth: Payer: Self-pay | Admitting: *Deleted

## 2024-03-02 ENCOUNTER — Other Ambulatory Visit: Payer: Self-pay | Admitting: *Deleted

## 2024-03-02 ENCOUNTER — Encounter: Payer: Self-pay | Admitting: *Deleted

## 2024-03-02 ENCOUNTER — Inpatient Hospital Stay: Attending: Hematology and Oncology | Admitting: Hematology and Oncology

## 2024-03-02 VITALS — BP 140/84 | HR 89 | Temp 97.8°F | Resp 16 | Ht 67.0 in | Wt 162.9 lb

## 2024-03-02 DIAGNOSIS — C50212 Malignant neoplasm of upper-inner quadrant of left female breast: Secondary | ICD-10-CM

## 2024-03-02 DIAGNOSIS — Z170 Estrogen receptor positive status [ER+]: Secondary | ICD-10-CM | POA: Diagnosis not present

## 2024-03-02 LAB — SURGICAL PATHOLOGY

## 2024-03-02 MED ORDER — PROCHLORPERAZINE MALEATE 10 MG PO TABS: 10.0000 mg | ORAL_TABLET | Freq: Four times a day (QID) | ORAL | 1 refills | Status: AC | PRN

## 2024-03-02 MED ORDER — DEXAMETHASONE 4 MG PO TABS: ORAL_TABLET | ORAL | 0 refills | Status: DC

## 2024-03-02 MED ORDER — ONDANSETRON HCL 8 MG PO TABS: ORAL_TABLET | ORAL | 1 refills | Status: DC

## 2024-03-02 MED ORDER — LIDOCAINE-PRILOCAINE 2.5-2.5 % EX CREA: TOPICAL_CREAM | CUTANEOUS | 3 refills | Status: AC

## 2024-03-02 NOTE — Progress Notes (Signed)
 Patient Care Team: Loreli Kins, MD as PCP - General (Family Medicine) Shlomo Wilbert SAUNDERS, MD as PCP - Cardiology (Cardiology) Gerome, Devere HERO, RN as Oncology Nurse Navigator Tyree Nanetta SAILOR, RN as Registered Nurse Curvin Deward MOULD, MD as Consulting Physician (General Surgery) Odean Potts, MD as Consulting Physician (Hematology and Oncology) Izell Domino, MD as Attending Physician (Radiation Oncology)  DIAGNOSIS:  Encounter Diagnosis  Name Primary?   Malignant neoplasm of upper-inner quadrant of left breast in female, estrogen receptor positive (HCC) Yes    SUMMARY OF ONCOLOGIC HISTORY: Oncology History  Malignant neoplasm of upper-inner quadrant of left breast in female, estrogen receptor positive (HCC)  01/13/2024 Initial Diagnosis   History of left breast cancer: 2001: Lumpectomy and radiation Screening mammogram detected left breast mass by ultrasound 2.2 cm at 9 o'clock position, axilla negative biopsy: Grade 3 IDC ER 85%, PR 0%, HER2 equivalent FISH pending, Ki-67 95%   01/21/2024 Cancer Staging   Staging form: Breast, AJCC 8th Edition - Clinical: Stage IIB (cT2, cN0, cM0, G3, ER+, PR-, HER2-) - Signed by Odean Potts, MD on 01/21/2024 Stage prefix: Initial diagnosis Histologic grading system: 3 grade system   01/27/2024 Genetic Testing   Negative genetic testing The report date is January 27, 2024.  The Multi-Cancer + RNA Panel offered by [facility] includes sequencing and/or deletion/duplication analysis of the following 70 genes:  AIP*, ALK, APC*, ATM*, AXIN2*, BAP1*, BARD1*, BLM*, BMPR1A*, BRCA1*, BRCA2*, BRIP1*, CDC73*, CDH1*, CDK4, CDKN1B*, CDKN2A, CHEK2*, CTNNA1*, DICER1*, EPCAM (del/dup only), EGFR, FH*, FLCN*, GREM1 (promoter dup only), HOXB13, KIT, LZTR1, MAX*, MBD4, MEN1*, MET, MITF, MLH1*, MSH2*, MSH3*, MSH6*, MUTYH*, NF1*, NF2*, NTHL1*, PALB2*, PDGFRA, PMS2*, POLD1*, POLE*, POT1*, PRKAR1A*, PTCH1*, PTEN*, RAD51C*, RAD51D*, RB1*, RET, SDHA* (sequencing only),  SDHAF2*, SDHB*, SDHC*, SDHD*, SMAD4*, SMARCA4*, SMARCB1*, SMARCE1*, STK11*, SUFU*, TMEM127*, TP53*, TSC1*, TSC2*, VHL*. RNA analysis is performed for * genes.      CHIEF COMPLIANT:   HISTORY OF PRESENT ILLNESS: Discussed the use of AI scribe software for clinical note transcription with the patient, who gave verbal consent to proceed.  History of Present Illness Amber Ross is a 68 year old female with breast cancer who presents for follow-up after surgery.  She underwent surgery for breast cancer, with a final pathology report showing a 3.6 cm grade three tumor. Margins were negative, and all nine lymph nodes were clear of cancer.  The estrogen receptor status was initially 85% positive but is now negative upon retesting. The progesterone receptor is negative. The proliferation rate increased from 90% to 95%. HER2 status is currently equivocal, pending further FISH testing, with previous FISH results being negative.  She is not on hormone therapy due to the estrogen receptor negative status. She is considering short-term disability in anticipation of fatigue and side effects from upcoming chemotherapy. She works as a scientist, research (life sciences) and is evaluating her leave options during treatment.     ALLERGIES:  has no known allergies.  MEDICATIONS:  Current Outpatient Medications  Medication Sig Dispense Refill   amphetamine-dextroamphetamine (ADDERALL) 10 MG tablet Take 2 tablets by mouth in the morning, and 1 tablet by mouth in the evening.     atorvastatin  (LIPITOR) 40 MG tablet Take 1 tablet (40 mg total) by mouth daily. 30 tablet 0   Biotin w/ Vitamins C & E (HAIR/SKIN/NAILS PO) Take 2 each by mouth daily. Gummies     Cholecalciferol (VITAMIN D3) 125 MCG (5000 UT) CAPS Take 5,000 Units by mouth daily.     diazepam (  VALIUM) 2 MG tablet Take 1 tablet (2 mg total) by mouth every 12 (twelve) hours as needed for muscle spasms. 20 tablet 0   lisinopril-hydrochlorothiazide  (ZESTORETIC) 20-12.5 MG tablet Take 1 tablet by mouth daily.     omeprazole (PRILOSEC) 20 MG capsule Take 20 mg by mouth daily.     ondansetron  (ZOFRAN -ODT) 4 MG disintegrating tablet Take 1 tablet (4 mg total) by mouth every 8 (eight) hours as needed for nausea or vomiting. 20 tablet 0   venlafaxine XR (EFFEXOR-XR) 150 MG 24 hr capsule Take 150 mg by mouth daily with breakfast. 1 ca     vitamin B-12 (CYANOCOBALAMIN) 1000 MCG tablet Take 1,000 mcg by mouth daily.     TURMERIC CURCUMIN PO Take 2 each by mouth daily. Gummies (Patient not taking: Reported on 03/02/2024)     No current facility-administered medications for this visit.    PHYSICAL EXAMINATION: ECOG PERFORMANCE STATUS: 1 - Symptomatic but completely ambulatory  Vitals:   03/02/24 1044  BP: (!) 140/84  Pulse: 89  Resp: 16  Temp: 97.8 F (36.6 C)  SpO2: 98%   Filed Weights   03/02/24 1044  Weight: 162 lb 14.4 oz (73.9 kg)    Physical Exam   (exam performed in the presence of a chaperone)  LABORATORY DATA:  I have reviewed the data as listed    Latest Ref Rng & Units 01/21/2024   12:47 PM 03/06/2021    7:35 AM 10/03/2020    9:26 AM  CMP  Glucose 70 - 99 mg/dL 98     BUN 8 - 23 mg/dL 13     Creatinine 9.55 - 1.00 mg/dL 9.13     Sodium 864 - 854 mmol/L 138     Potassium 3.5 - 5.1 mmol/L 3.9     Chloride 98 - 111 mmol/L 101     CO2 22 - 32 mmol/L 32     Calcium  8.9 - 10.3 mg/dL 89.8     Total Protein 6.5 - 8.1 g/dL 7.6     Total Bilirubin 0.0 - 1.2 mg/dL 0.8     Alkaline Phos 38 - 126 U/L 64     AST 15 - 41 U/L 10     ALT 0 - 44 U/L 16  16  10      Lab Results  Component Value Date   WBC 6.7 01/21/2024   HGB 13.5 01/21/2024   HCT 41.1 01/21/2024   MCV 87.3 01/21/2024   PLT 160 01/21/2024   NEUTROABS 4.2 01/21/2024    ASSESSMENT & PLAN:  Malignant neoplasm of upper-inner quadrant of left breast in female, estrogen receptor positive (HCC) 01/13/2024:History of left breast cancer: 2001: Lumpectomy and  radiation Screening mammogram detected left breast mass by ultrasound 2.2 cm at 9 o'clock position, axilla negative biopsy: Grade 3 IDC ER 85%, PR 0%, HER2 equivalent FISH pending, Ki-67 95%   Recommendations: 1.  02/16/2024: Left mastectomy: Grade 3 IDC 3.6 cm, margins negative, 0/9 lymph nodes, repeat prognostic panel: ER 0%, PR 0%, Ki67 90%, HER2 2+ by IHC FISH is pending 2.  Recommend adjuvant chemotherapy with Adriamycin Cytoxan dose dense x 4 followed by Taxol weekly x 12 (assuming HER2 is negative) 3.  With mastectomy and prior radiation, no role of additional adjuvant radiation.  With estrogen receptor being negative there is no role of antiestrogen therapy  Chemotherapy Counseling: I discussed the risks and benefits of chemotherapy including the risks of nausea/ vomiting, risk of infection from low WBC count,  fatigue due to chemo or anemia, bruising or bleeding due to low platelets, mouth sores, loss/ change in taste and decreased appetite. Liver and kidney function will be monitored through out chemotherapy as abnormalities in liver and kidney function may be a side effect of treatment.  Neuropathy risk from Taxotere were discussed in detail. Risk of permanent bone marrow dysfunction and leukemia due to chemo were also discussed.  We will plan port, echo, chemo class and start chemotherapy first week of December.  No orders of the defined types were placed in this encounter.  The patient has a good understanding of the overall plan. she agrees with it. she will call with any problems that may develop before the next visit here.  I personally spent a total of 45 minutes in the care of the patient today including preparing to see the patient, getting/reviewing separately obtained history, performing a medically appropriate exam/evaluation, counseling and educating, placing orders, referring and communicating with other health care professionals, documenting clinical information in the EHR,  independently interpreting results, communicating results, and coordinating care.   Viinay K Jalan Fariss, MD 03/02/24

## 2024-03-02 NOTE — Progress Notes (Signed)
START ON PATHWAY REGIMEN - Breast     Cycles 1 through 4: A cycle is every 14 days:     Doxorubicin      Cyclophosphamide      [id number]-[id number]    Cycles 5 through 16: A cycle is every 7 days:     Paclitaxel   **Always confirm dose/schedule in your pharmacy ordering system**  Patient Characteristics: Postoperative without Neoadjuvant Therapy, M0 (Pathologic Staging), Invasive Disease, Adjuvant Therapy, HER2 Negative, ER Negative, Node Negative, pT1a-c, N1mi or pT1c or Higher, pN0 Therapeutic Status: Postoperative without Neoadjuvant Therapy, M0 (Pathologic Staging) AJCC Grade: G3 AJCC N Category: pN0 AJCC M Category: cM0 ER Status: Negative (-) AJCC 8 Stage Grouping: IIA HER2 Status: Negative (-) Oncotype Dx Recurrence Score: Not Appropriate AJCC T Category: pT2 PR Status: Negative (-) Intent of Therapy: Curative Intent, Discussed with Patient 

## 2024-03-02 NOTE — Telephone Encounter (Signed)
 Spoke with patient to relay HER2 results as negative.  Informed her I would send a message to Dr. Curvin for port placement and if he could not place it before 1st week December, we will get IR to place it.  Patient verbalized understanding.  Placed orders for echo and chemo education.  Echo is scheduled for 11/12 @ 1130 at [facility] - and patient aware of date/time/location and directions. Let her know someone will call with appt for chemo edu.

## 2024-03-02 NOTE — Addendum Note (Signed)
 Addended by: ODEAN POTTS on: 03/02/2024 05:16 PM   Modules accepted: Orders

## 2024-03-03 ENCOUNTER — Other Ambulatory Visit: Payer: Self-pay | Admitting: Hematology and Oncology

## 2024-03-03 ENCOUNTER — Ambulatory Visit (HOSPITAL_BASED_OUTPATIENT_CLINIC_OR_DEPARTMENT_OTHER)
Admission: RE | Admit: 2024-03-03 | Discharge: 2024-03-03 | Disposition: A | Discharge status: Home / Self Care | Source: Ambulatory Visit | Attending: Hematology and Oncology | Admitting: Hematology and Oncology

## 2024-03-03 ENCOUNTER — Other Ambulatory Visit: Payer: Self-pay

## 2024-03-03 ENCOUNTER — Ambulatory Visit: Payer: Self-pay | Admitting: General Surgery

## 2024-03-03 DIAGNOSIS — C50212 Malignant neoplasm of upper-inner quadrant of left female breast: Secondary | ICD-10-CM | POA: Insufficient documentation

## 2024-03-03 DIAGNOSIS — Z170 Estrogen receptor positive status [ER+]: Secondary | ICD-10-CM | POA: Diagnosis not present

## 2024-03-03 DIAGNOSIS — Z0189 Encounter for other specified special examinations: Secondary | ICD-10-CM

## 2024-03-03 LAB — ECHOCARDIOGRAM COMPLETE
AR max vel: 1.95 cm2
AV Area VTI: 2.09 cm2
AV Area mean vel: 2.12 cm2
AV Mean grad: 4 mmHg
AV Peak grad: 7.1 mmHg
AV Vena cont: 0.3 cm
Ao pk vel: 1.33 m/s
Area-P 1/2: 3.39 cm2
Calc EF: 55.7 %
MV M vel: 3.08 m/s
MV Peak grad: 37.9 mmHg
S' Lateral: 1.95 cm
Single Plane A2C EF: 55.4 %
Single Plane A4C EF: 57.4 %

## 2024-03-04 ENCOUNTER — Other Ambulatory Visit: Payer: Self-pay

## 2024-03-04 NOTE — Progress Notes (Signed)
 [facility]        Telephone: [phone]?Fax: [phone]   Oncology Clinical Pharmacist Practitioner Initial Assessment   Amber Ross is a 68 y.o. female with a diagnosis of breast cancer. They were contacted today via in-person visit.  Indication/Regimen Doxorubicin (Adriamycin) and cyclophosphamide (Cytoxan) followed by paclitaxel (Taxol) are being used appropriately for treatment of breast cancer by Dr. Vinay Gudena.      Wt Readings from Last 1 Encounters:  03/08/24 163 lb 3.2 oz (74 kg)    Estimated body surface area is 1.87 meters squared as calculated from the following:   Height as of this encounter: 5' 7 (1.702 m).   Weight as of this encounter: 163 lb 3.2 oz (74 kg).  The dosing regimen is every 14 days for 4 cycles  Doxorubicin (60 mg/m2) on Day 1 Cyclophosphamide (600 mg/m2) on Day 1 Pegfilgrastim (6 mg) on Day 3  Followed by a dosing regimen that is every 7 days for 12 cycles  Paclitaxel (80 mg/m2) on Day 1  It is planned to continue until treatment plan completion or unacceptable toxicity. The tentative start date is: 03/24/24  Dose Modifications None   Allergies No Known Allergies  Vitals    03/08/2024    2:27 PM 03/02/2024   10:44 AM 02/17/2024    7:04 AM  Oncology Vitals  Height 170 cm 170 cm   Weight 74.027 kg 73.891 kg   Weight (lbs) 163 lbs 3 oz 162 lbs 14 oz   BMI 25.56 kg/m2 25.51 kg/m2   Temp 98 F (36.7 C) 97.8 F (36.6 C) 98 F (36.7 C)  Pulse Rate 93 89 79  BP 142/87 140/84 127/74  Resp 18 16 17   SpO2 97 % 98 % 98 %  BSA (m2) 1.87 m2 1.87 m2      Laboratory Data    Latest Ref Rng & Units 01/21/2024   12:47 PM 08/10/2015    9:35 AM  CBC EXTENDED  WBC 4.0 - 10.5 K/uL 6.7  6.7   RBC 3.87 - 5.11 MIL/uL 4.71  5.00   Hemoglobin 12.0 - 15.0 g/dL 86.4  85.3   HCT 63.9 - 46.0 % 41.1  41.5   Platelets 150 - 400 K/uL 160    NEUT# 1.7 - 7.7 K/uL 4.2    Lymph# 0.7 - 4.0 K/uL 1.9         Latest Ref Rng &  Units 01/21/2024   12:47 PM 03/06/2021    7:35 AM 10/03/2020    9:26 AM  CMP  Glucose 70 - 99 mg/dL 98     BUN 8 - 23 mg/dL 13     Creatinine 9.55 - 1.00 mg/dL 9.13     Sodium 864 - 854 mmol/L 138     Potassium 3.5 - 5.1 mmol/L 3.9     Chloride 98 - 111 mmol/L 101     CO2 22 - 32 mmol/L 32     Calcium  8.9 - 10.3 mg/dL 89.8     Total Protein 6.5 - 8.1 g/dL 7.6     Total Bilirubin 0.0 - 1.2 mg/dL 0.8     Alkaline Phos 38 - 126 U/L 64     AST 15 - 41 U/L 10     ALT 0 - 44 U/L 16  16  10     Contraindications Contraindications were reviewed? Yes Contraindications to therapy were identified? No   Safety Precautions The following safety precautions were reviewed:  Fever: reviewed  the importance of having a thermometer and the Centers for Disease Control and Prevention (CDC) definition of fever which is 100.66F (38C) or higher. Patient should call 24/7 triage at [phone] if experiencing a fever or any other symptoms Decreased white blood cells (WBCs) and increased risk for infection Decreased platelet count and increased risk of bleeding Decreased hemoglobin, part of the red blood cells that carry iron and oxygen Change in the color of urine Fatigue Hair loss Nausea or vomiting Mouth irritation or sores Doxorubicin (vesicant) Cardiotoxicity Hemorrhagic cystitis Secondary cancers Muscle or joint pain or weakness Peripheral neuropathy Hypersensitivity reactions Avoid grapefruit products Intimacy, sexual activity, contraception, fertility Handling body fluids and waste  Medication Reconciliation Current Outpatient Medications  Medication Sig Dispense Refill   amphetamine-dextroamphetamine (ADDERALL) 10 MG tablet Take 2 tablets by mouth in the morning, and 1 tablet by mouth in the evening.     atorvastatin  (LIPITOR) 40 MG tablet Take 1 tablet (40 mg total) by mouth daily. 30 tablet 0   Biotin w/ Vitamins C & E (HAIR/SKIN/NAILS PO) Take 2 each by mouth daily. Gummies      Cholecalciferol (VITAMIN D3) 125 MCG (5000 UT) CAPS Take 5,000 Units by mouth daily.     lisinopril-hydrochlorothiazide (ZESTORETIC) 20-12.5 MG tablet Take 1 tablet by mouth daily.     omeprazole (PRILOSEC) 20 MG capsule Take 20 mg by mouth daily.     traMADol (ULTRAM) 50 MG tablet Take 50 mg by mouth daily as needed (Cough per Dr. Loreli).     venlafaxine XR (EFFEXOR-XR) 150 MG 24 hr capsule Take 150 mg by mouth daily with breakfast. 1 ca     vitamin B-12 (CYANOCOBALAMIN) 1000 MCG tablet Take 1,000 mcg by mouth daily.     dexamethasone  (DECADRON ) 4 MG tablet Take 1 tablet day after chemo and 1 tablet 2 days after chemo with food (Patient not taking: Reported on 03/08/2024) 8 tablet 0   diazepam (VALIUM) 2 MG tablet Take 1 tablet (2 mg total) by mouth every 12 (twelve) hours as needed for muscle spasms. 20 tablet 0   lidocaine -prilocaine (EMLA) cream Apply to affected area once (Patient not taking: Reported on 03/08/2024) 30 g 3   ondansetron  (ZOFRAN ) 8 MG tablet Take 1 tab (8 mg) by mouth every 8 hrs as needed for nausea/vomiting. Start third day after doxorubicin/cyclophosphamide chemotherapy. (Patient not taking: Reported on 03/08/2024) 30 tablet 1   ondansetron  (ZOFRAN -ODT) 4 MG disintegrating tablet Take 1 tablet (4 mg total) by mouth every 8 (eight) hours as needed for nausea or vomiting. (Patient not taking: Reported on 03/08/2024) 20 tablet 0   prochlorperazine (COMPAZINE) 10 MG tablet Take 1 tablet (10 mg total) by mouth every 6 (six) hours as needed for nausea or vomiting. (Patient not taking: Reported on 03/08/2024) 30 tablet 1   TURMERIC CURCUMIN PO Take 2 each by mouth daily. Gummies (Patient not taking: Reported on 03/08/2024)     No current facility-administered medications for this visit.   Medication reconciliation is based on the patient's most recent medication list in the electronic medical record (EMR) including herbal products and OTC medications.   The patient's medication list  was reviewed today with the patient? Yes   Drug-drug interactions (DDIs) DDIs were evaluated? Yes Significant DDIs identified? No , we reviewed serotonin syndrome symptoms since on tramadol and escitalopram  Drug-Food Interactions Drug-food interactions were evaluated? Yes Drug-food interactions identified? Avoid grapefruit products  Follow-up Plan  Treatment start date: 03/24/24 Port placement date: 03/11/24 ECHO  date: 03/03/24 We reviewed the prescriptions, premedications, and treatment regimen with the patient. Possible side effects of the treatment regimen were reviewed and management strategies were discussed.  Can use over-the-counter (OTC) options of loperamide (Imodium) as needed for diarrhea, loratadine (Claritin) as needed for G-CSF bone pain, and docusate + senna (Senna-S) as needed for constipation.  She knows not to use ondansetron  4 mg ODT since we sent 8 mg prescription for chemotherapy treatment Clinical pharmacy will assist Dr. Vinay Gudena and Amber Ross on an as needed basis going forward  Amber Ross participated in the discussion, expressed understanding, and voiced agreement with the above plan. All questions were answered to her satisfaction. The patient was advised to contact the clinic at [phone] with any questions or concerns prior to her return visit.   I spent 60 minutes assessing the patient.  Amber Ross, PharmD, BCOP, CPP  Amber Ross, RPH-CPP, 03/08/2024 3:24 PM  **Disclaimer: This note was dictated with voice recognition software. Similar sounding words can inadvertently be transcribed and this note may contain transcription errors which may not have been corrected upon publication of note.**

## 2024-03-07 ENCOUNTER — Other Ambulatory Visit: Payer: Self-pay

## 2024-03-08 ENCOUNTER — Inpatient Hospital Stay

## 2024-03-08 ENCOUNTER — Inpatient Hospital Stay: Admitting: Pharmacist

## 2024-03-08 ENCOUNTER — Encounter: Payer: Self-pay | Admitting: *Deleted

## 2024-03-08 VITALS — BP 142/87 | HR 93 | Temp 98.0°F | Resp 18 | Ht 67.0 in | Wt 163.2 lb

## 2024-03-08 DIAGNOSIS — C50212 Malignant neoplasm of upper-inner quadrant of left female breast: Secondary | ICD-10-CM

## 2024-03-09 ENCOUNTER — Other Ambulatory Visit: Payer: Self-pay

## 2024-03-10 ENCOUNTER — Other Ambulatory Visit: Payer: Self-pay

## 2024-03-10 ENCOUNTER — Encounter (HOSPITAL_COMMUNITY): Payer: Self-pay | Admitting: General Surgery

## 2024-03-10 ENCOUNTER — Telehealth: Payer: Self-pay | Admitting: *Deleted

## 2024-03-10 NOTE — Telephone Encounter (Signed)
 Received call from pt stating her Dentist would like to perform a deep clean on her teeth due to excessive plaque build up.  Per MD  pt needing to have deep clean prior to starting treatment on 12/4.  Pt educated to contact her Dentist to see if they can perform deep clean next week and alert our office to review with MD as prophylactic antibiotic may be needed prior to cleaning.  Pt verbalized understanding.

## 2024-03-10 NOTE — Progress Notes (Signed)
 SDW CALL  Patient was given pre-op instructions over the phone. The opportunity was given for the patient to ask questions. No further questions asked. Patient verbalized understanding of instructions given.   PCP - Joen Gentry Cardiologist - Dr. Wilbert Bihari  PPM/ICD - denies Device Orders - n/a Rep Notified -  n/a  Chest x-ray - denies EKG - 02/10/24 Stress Test - denies ECHO - 03/03/24 Cardiac Cath - denies  Sleep Study - mild sleep apnea and no CPAP needed  No DM  Last dose of GLP1 agonist-  n/a GLP1 instructions:  n/a  Blood Thinner Instructions: n/a Aspirin Instructions: n/a  ERAS Protcol - clears until 0515 PRE-SURGERY Ensure or G2- n/a  COVID TEST- n/a   Anesthesia review: yes - history of HTN, PFO and mild OSA - patient denies any cardiopulmonary symptoms.    Patient denies shortness of breath, fever, cough and chest pain over the phone call   All instructions explained to the patient, with a verbal understanding of the material. Patient agrees to go over the instructions while at home for a better understanding.

## 2024-03-11 ENCOUNTER — Ambulatory Visit (HOSPITAL_COMMUNITY): Admitting: Registered Nurse

## 2024-03-11 ENCOUNTER — Ambulatory Visit (HOSPITAL_COMMUNITY)
Admission: RE | Admit: 2024-03-11 | Discharge: 2024-03-11 | Disposition: A | Discharge status: Home / Self Care | Attending: General Surgery | Admitting: General Surgery

## 2024-03-11 ENCOUNTER — Other Ambulatory Visit: Payer: Self-pay | Admitting: *Deleted

## 2024-03-11 ENCOUNTER — Telehealth: Payer: Self-pay | Admitting: *Deleted

## 2024-03-11 ENCOUNTER — Ambulatory Visit (HOSPITAL_COMMUNITY)

## 2024-03-11 ENCOUNTER — Encounter (HOSPITAL_COMMUNITY): Payer: Self-pay | Admitting: General Surgery

## 2024-03-11 ENCOUNTER — Encounter (HOSPITAL_COMMUNITY)
Admission: RE | Disposition: A | Discharge status: Home / Self Care | Payer: Self-pay | Source: Home / Self Care | Attending: General Surgery

## 2024-03-11 DIAGNOSIS — C50912 Malignant neoplasm of unspecified site of left female breast: Secondary | ICD-10-CM

## 2024-03-11 DIAGNOSIS — R7303 Prediabetes: Secondary | ICD-10-CM | POA: Insufficient documentation

## 2024-03-11 DIAGNOSIS — I10 Essential (primary) hypertension: Secondary | ICD-10-CM

## 2024-03-11 DIAGNOSIS — I2510 Atherosclerotic heart disease of native coronary artery without angina pectoris: Secondary | ICD-10-CM | POA: Insufficient documentation

## 2024-03-11 DIAGNOSIS — C50212 Malignant neoplasm of upper-inner quadrant of left female breast: Secondary | ICD-10-CM | POA: Insufficient documentation

## 2024-03-11 DIAGNOSIS — Z1722 Progesterone receptor negative status: Secondary | ICD-10-CM | POA: Insufficient documentation

## 2024-03-11 DIAGNOSIS — F988 Other specified behavioral and emotional disorders with onset usually occurring in childhood and adolescence: Secondary | ICD-10-CM | POA: Insufficient documentation

## 2024-03-11 DIAGNOSIS — Z170 Estrogen receptor positive status [ER+]: Secondary | ICD-10-CM | POA: Insufficient documentation

## 2024-03-11 DIAGNOSIS — Z79899 Other long term (current) drug therapy: Secondary | ICD-10-CM | POA: Insufficient documentation

## 2024-03-11 DIAGNOSIS — K219 Gastro-esophageal reflux disease without esophagitis: Secondary | ICD-10-CM | POA: Insufficient documentation

## 2024-03-11 DIAGNOSIS — Z1732 Human epidermal growth factor receptor 2 negative status: Secondary | ICD-10-CM | POA: Insufficient documentation

## 2024-03-11 DIAGNOSIS — Z87891 Personal history of nicotine dependence: Secondary | ICD-10-CM

## 2024-03-11 DIAGNOSIS — E7800 Pure hypercholesterolemia, unspecified: Secondary | ICD-10-CM | POA: Insufficient documentation

## 2024-03-11 DIAGNOSIS — M96843 Postprocedural seroma of a musculoskeletal structure following other procedure: Secondary | ICD-10-CM | POA: Insufficient documentation

## 2024-03-11 DIAGNOSIS — G4730 Sleep apnea, unspecified: Secondary | ICD-10-CM | POA: Insufficient documentation

## 2024-03-11 HISTORY — PX: PORTACATH PLACEMENT: SHX2246

## 2024-03-11 LAB — BASIC METABOLIC PANEL WITH GFR
Anion gap: 17 — ABNORMAL HIGH (ref 5–15)
BUN: 17 mg/dL (ref 8–23)
CO2: 23 mmol/L (ref 22–32)
Calcium: 9.5 mg/dL (ref 8.9–10.3)
Chloride: 101 mmol/L (ref 98–111)
Creatinine, Ser: 0.67 mg/dL (ref 0.44–1.00)
GFR, Estimated: >60 mL/min (ref >60)
Glucose, Bld: 96 mg/dL (ref 70–99)
Potassium: 3.6 mmol/L (ref 3.5–5.1)
Sodium: 141 mmol/L (ref 135–145)

## 2024-03-11 LAB — CBC
HCT: 38.8 % (ref 36.0–46.0)
Hemoglobin: 12.7 g/dL (ref 12.0–15.0)
MCH: 28.9 pg (ref 26.0–34.0)
MCHC: 32.7 g/dL (ref 30.0–36.0)
MCV: 88.4 fL (ref 80.0–100.0)
Platelets: 134 K/uL — ABNORMAL LOW (ref 150–400)
RBC: 4.39 MIL/uL (ref 3.87–5.11)
RDW: 11.9 % (ref 11.5–15.5)
WBC: 5.6 K/uL (ref 4.0–10.5)
nRBC: 0 % (ref 0.0–0.2)

## 2024-03-11 SURGERY — INSERTION, TUNNELED CENTRAL VENOUS DEVICE, WITH PORT
Anesthesia: General

## 2024-03-11 MED ORDER — DEXAMETHASONE SOD PHOSPHATE PF 10 MG/ML IJ SOLN
INTRAMUSCULAR | Status: DC | PRN
Start: 2024-03-11 — End: 2024-03-11
  Administered 2024-03-11: 10 mg via INTRAVENOUS

## 2024-03-11 MED ORDER — LACTATED RINGERS IV SOLN: INTRAVENOUS | Status: DC

## 2024-03-11 MED ORDER — MIDAZOLAM HCL (PF) 2 MG/2ML IJ SOLN
INTRAMUSCULAR | Status: DC | PRN
  Administered 2024-03-11: 2 mg via INTRAVENOUS

## 2024-03-11 MED ORDER — CHLORHEXIDINE GLUCONATE CLOTH 2 % EX PADS: 6.0000 | MEDICATED_PAD | Freq: Once | CUTANEOUS | Status: DC

## 2024-03-11 MED ORDER — PROPOFOL 10 MG/ML IV BOLUS
INTRAVENOUS | Status: DC | PRN
  Administered 2024-03-11: 120 mg via INTRAVENOUS

## 2024-03-11 MED ORDER — BUPIVACAINE HCL (PF) 0.25 % IJ SOLN
INTRAMUSCULAR | Status: DC | PRN
  Administered 2024-03-11: 9 mL

## 2024-03-11 MED ORDER — ALBUMIN HUMAN 5 % IV SOLN: INTRAVENOUS | Status: DC | PRN

## 2024-03-11 MED ORDER — VASOPRESSIN 20 UNIT/ML IV SOLN
INTRAVENOUS | Status: AC
Start: 2024-03-11 — End: 2024-03-11
  Filled 2024-03-11: qty 1

## 2024-03-11 MED ORDER — HEPARIN SOD (PORK) LOCK FLUSH 100 UNIT/ML IV SOLN
INTRAVENOUS | Status: DC | PRN
  Administered 2024-03-11: 500 [IU]

## 2024-03-11 MED ORDER — CEFAZOLIN SODIUM-DEXTROSE 2-4 GM/100ML-% IV SOLN
2.0000 g | INTRAVENOUS | Status: AC
  Administered 2024-03-11: 2 g via INTRAVENOUS

## 2024-03-11 MED ORDER — CHLORHEXIDINE GLUCONATE 0.12 % MT SOLN
OROMUCOSAL | Status: AC
  Administered 2024-03-11: 15 mL via OROMUCOSAL
  Filled 2024-03-11: qty 15

## 2024-03-11 MED ORDER — LIDOCAINE 2% (20 MG/ML) 5 ML SYRINGE
INTRAMUSCULAR | Status: AC
  Filled 2024-03-11: qty 5

## 2024-03-11 MED ORDER — FENTANYL CITRATE (PF) 100 MCG/2ML IJ SOLN
INTRAMUSCULAR | Status: AC
  Filled 2024-03-11: qty 2

## 2024-03-11 MED ORDER — EPHEDRINE 5 MG/ML INJ
INTRAVENOUS | Status: AC
  Filled 2024-03-11: qty 5

## 2024-03-11 MED ORDER — HEPARIN SOD (PORK) LOCK FLUSH 100 UNIT/ML IV SOLN
INTRAVENOUS | Status: AC
  Filled 2024-03-11: qty 5

## 2024-03-11 MED ORDER — AMOXICILLIN 500 MG PO TABS: 2000.0000 mg | ORAL_TABLET | Freq: Once | ORAL | 0 refills | Status: AC

## 2024-03-11 MED ORDER — PROPOFOL 10 MG/ML IV BOLUS
INTRAVENOUS | Status: AC
  Filled 2024-03-11: qty 20

## 2024-03-11 MED ORDER — BUPIVACAINE HCL (PF) 0.25 % IJ SOLN
INTRAMUSCULAR | Status: AC
  Filled 2024-03-11: qty 30

## 2024-03-11 MED ORDER — HEPARIN 6000 UNIT IRRIGATION SOLUTION
Status: AC
  Filled 2024-03-11: qty 500

## 2024-03-11 MED ORDER — ORAL CARE MOUTH RINSE: 15.0000 mL | Freq: Once | OROMUCOSAL | Status: AC

## 2024-03-11 MED ORDER — EPHEDRINE SULFATE-NACL 50-0.9 MG/10ML-% IV SOSY
PREFILLED_SYRINGE | INTRAVENOUS | Status: DC | PRN
Start: 2024-03-11 — End: 2024-03-11
  Administered 2024-03-11 (×2): 10 mg via INTRAVENOUS

## 2024-03-11 MED ORDER — VASOPRESSIN 20 UNIT/ML IV SOLN
INTRAVENOUS | Status: DC | PRN
  Administered 2024-03-11: 2 [IU] via INTRAVENOUS
  Administered 2024-03-11 (×2): 1 [IU] via INTRAVENOUS
  Administered 2024-03-11: 2 [IU] via INTRAVENOUS
  Administered 2024-03-11: 1 [IU] via INTRAVENOUS

## 2024-03-11 MED ORDER — HEPARIN 6000 UNIT IRRIGATION SOLUTION
Status: DC | PRN
  Administered 2024-03-11: 1

## 2024-03-11 MED ORDER — MIDAZOLAM HCL 2 MG/2ML IJ SOLN
INTRAMUSCULAR | Status: AC
  Filled 2024-03-11: qty 2

## 2024-03-11 MED ORDER — ONDANSETRON HCL 4 MG/2ML IJ SOLN
INTRAMUSCULAR | Status: DC | PRN
  Administered 2024-03-11: 4 mg via INTRAVENOUS

## 2024-03-11 MED ORDER — CHLORHEXIDINE GLUCONATE 0.12 % MT SOLN: 15.0000 mL | Freq: Once | OROMUCOSAL | Status: AC

## 2024-03-11 MED ORDER — CEFAZOLIN SODIUM-DEXTROSE 2-4 GM/100ML-% IV SOLN
INTRAVENOUS | Status: AC
  Filled 2024-03-11: qty 100

## 2024-03-11 MED ORDER — OXYCODONE HCL 5 MG PO TABS: 5.0000 mg | ORAL_TABLET | Freq: Four times a day (QID) | ORAL | 0 refills | Status: DC | PRN

## 2024-03-11 MED ORDER — FENTANYL CITRATE (PF) 250 MCG/5ML IJ SOLN
INTRAMUSCULAR | Status: DC | PRN
  Administered 2024-03-11: 100 ug via INTRAVENOUS

## 2024-03-11 MED ORDER — ONDANSETRON HCL 4 MG/2ML IJ SOLN
INTRAMUSCULAR | Status: AC
  Filled 2024-03-11: qty 2

## 2024-03-11 MED ORDER — LIDOCAINE 2% (20 MG/ML) 5 ML SYRINGE
INTRAMUSCULAR | Status: DC | PRN
  Administered 2024-03-11: 40 mg via INTRAVENOUS

## 2024-03-11 SURGICAL SUPPLY — 31 items
APPLICATOR CHLORAPREP 10.5 ORG (MISCELLANEOUS) ×1 IMPLANT
BAG COUNTER SPONGE SURGICOUNT (BAG) ×1 IMPLANT
BAG DECANTER FOR FLEXI CONT (MISCELLANEOUS) ×1 IMPLANT
COVER SURGICAL LIGHT HANDLE (MISCELLANEOUS) ×1 IMPLANT
COVER TRANSDUCER ULTRASND GEL (DISPOSABLE) IMPLANT
DERMABOND ADVANCED .7 DNX12 (GAUZE/BANDAGES/DRESSINGS) ×1 IMPLANT
DRAPE C-ARM 42X120 X-RAY (DRAPES) ×1 IMPLANT
ELECT CAUTERY BLADE 6.4 (BLADE) ×1 IMPLANT
ELECTRODE REM PT RTRN 9FT ADLT (ELECTROSURGICAL) ×1 IMPLANT
GEL ULTRASOUND 20GR AQUASONIC (MISCELLANEOUS) IMPLANT
GLOVE BIO SURGEON STRL SZ7.5 (GLOVE) ×1 IMPLANT
GOWN STRL REUS W/ TWL LRG LVL3 (GOWN DISPOSABLE) ×2 IMPLANT
KIT BASIN OR (CUSTOM PROCEDURE TRAY) ×1 IMPLANT
KIT PORT POWER 8FR ISP CVUE (Port) IMPLANT
KIT TURNOVER KIT B (KITS) ×1 IMPLANT
NDL 22X1.5 STRL (OR ONLY) (MISCELLANEOUS) IMPLANT
NEEDLE 22X1.5 STRL (OR ONLY) (MISCELLANEOUS) IMPLANT
PAD ARMBOARD POSITIONER FOAM (MISCELLANEOUS) ×1 IMPLANT
PENCIL BUTTON HOLSTER BLD 10FT (ELECTRODE) ×1 IMPLANT
POSITIONER HEAD DONUT 9IN (MISCELLANEOUS) ×1 IMPLANT
SHEATH COOK PEEL AWAY SET 9F (SHEATH) IMPLANT
SOLN 0.9% NACL POUR BTL 1000ML (IV SOLUTION) ×1 IMPLANT
SPIKE FLUID TRANSFER (MISCELLANEOUS) ×1 IMPLANT
SUT MNCRL AB 4-0 PS2 18 (SUTURE) ×1 IMPLANT
SUT PROLENE 2 0 SH 30 (SUTURE) ×1 IMPLANT
SUT VIC AB 3-0 SH 27XBRD (SUTURE) ×1 IMPLANT
SYR 10ML LL (SYRINGE) IMPLANT
SYR 5ML LUER SLIP (SYRINGE) ×1 IMPLANT
TOWEL GREEN STERILE (TOWEL DISPOSABLE) ×1 IMPLANT
TOWEL GREEN STERILE FF (TOWEL DISPOSABLE) ×1 IMPLANT
TRAY LAPAROSCOPIC MC (CUSTOM PROCEDURE TRAY) ×1 IMPLANT

## 2024-03-11 NOTE — Telephone Encounter (Signed)
 Received call from pt stating she is scheduled for a deep dental cleaning on Monday 03/15/24. Pt requesting advice from MD if okay to proceed with cleaning.  Per MD okay to proceed but pt will need to be prescribed Amoxicillin 2g p.o with instructions to take full dose the morning of cleaning.  Pt educated and verbalized understanding.  Prescription sent to pharmacy on file.

## 2024-03-11 NOTE — Interval H&P Note (Signed)
 History and Physical Interval Note:  03/11/2024 7:34 AM  Amber Ross  has presented today for surgery, with the diagnosis of LEFT BREAST CANCER.  The various methods of treatment have been discussed with the patient and family. After consideration of risks, benefits and other options for treatment, the patient has consented to  Procedure(s) with comments: INSERTION, TUNNELED CENTRAL VENOUS DEVICE, WITH PORT (N/A) - PORT PLACEMENT WITH ULTRASOUND GUIDANCE as a surgical intervention.  The patient's history has been reviewed, patient examined, no change in status, stable for surgery.  I have reviewed the patient's chart and labs.  Questions were answered to the patient's satisfaction.     Deward Null III

## 2024-03-11 NOTE — Anesthesia Preprocedure Evaluation (Signed)
 Anesthesia Evaluation  Patient identified by MRN, date of birth, ID band Patient awake    Reviewed: Allergy & Precautions, H&P , NPO status , Patient's Chart, lab work & pertinent test results  Airway Mallampati: II   Neck ROM: full    Dental   Pulmonary sleep apnea , former smoker   breath sounds clear to auscultation       Cardiovascular hypertension,  Rhythm:regular Rate:Normal  PFO   Neuro/Psych    GI/Hepatic ,GERD  ,,  Endo/Other    Renal/GU      Musculoskeletal   Abdominal   Peds  Hematology   Anesthesia Other Findings   Reproductive/Obstetrics                              Anesthesia Physical Anesthesia Plan  ASA: 3  Anesthesia Plan: General   Post-op Pain Management:    Induction: Intravenous  PONV Risk Score and Plan: 3 and Ondansetron , Dexamethasone , Midazolam  and Treatment may vary due to age or medical condition  Airway Management Planned: Oral ETT  Additional Equipment:   Intra-op Plan:   Post-operative Plan: Extubation in OR  Informed Consent: I have reviewed the patients History and Physical, chart, labs and discussed the procedure including the risks, benefits and alternatives for the proposed anesthesia with the patient or authorized representative who has indicated his/her understanding and acceptance.     Dental advisory given  Plan Discussed with: CRNA, Anesthesiologist and Surgeon  Anesthesia Plan Comments:         Anesthesia Quick Evaluation

## 2024-03-11 NOTE — H&P (Signed)
 MRN: [id number] DOB: 09/07/1955 Subjective   Chief Complaint: Post Operative Visit   History of Present Illness: Amber Ross is a 68 y.o. female who is seen today for left breast cancer. The patient is a 68 year old white female who is about 3 weeks status post left mastectomy and sentinel node biopsy for a T2 N0 invasive ductal cancer that was ER positive and PR negative and HER2 negative with a Ki-67 of 95%. She tolerated the surgery well. She denies any chest wall pain. Her drain was removed last week. She is scheduled for port placement on Thursday    Review of Systems: A complete review of systems was obtained from the patient. I have reviewed this information and discussed as appropriate with the patient. See HPI as well for other ROS.  ROS   Medical History: Past Medical History:  Diagnosis Date  GERD (gastroesophageal reflux disease)  History of cancer  Hyperlipidemia  Hypertension   Patient Active Problem List  Diagnosis  ADD (attention deficit disorder)  Agatston coronary artery calcium  score less than 100  Aortic atherosclerosis  Atrophic vaginitis  CAD (coronary artery disease)  Chronic cough  Decreased estrogen level  Diastolic dysfunction  Essential hypertension  Gastroesophageal reflux disease  History of primary malignant neoplasm of breast  Major depression  Malignant neoplasm of upper-inner quadrant of left breast in female, estrogen receptor positive (CMS/HHS-HCC)  Osteopenia  PFO (patent foramen ovale) (HHS-HCC)  Psoriasis  Pure hypercholesterolemia  Recurrent major depressive disorder, in remission ()  Prediabetes  Postoperative breast asymmetry  Upper airway cough syndrome   Past Surgical History:  Procedure Laterality Date  Breast reduction surgery (Right Right 09/28/2018  Mastopexy (Left) Left 09/28/2018  Breast biopsy (Left) Left 01/13/2024  .Breast biopsy  Date Unknown  Breast lumpectomy (Left) Left  Date Unknown  CESAREAN SECTION  N/A  Date Unknown  Facial cosmetic surgery  Date Unknown  REDUCTION MAMMAPLASTY BILATERAL Bilateral  Date Unknown    Allergies  Allergen Reactions  Bupropion Other (See Comments)   Current Outpatient Medications on File Prior to Visit  Medication Sig Dispense Refill  atorvastatin  (LIPITOR) 40 MG tablet Take 40 mg by mouth once daily  dextroamphetamine -amphetamine  (ADDERALL) 10 mg tablet Take 10 mg by mouth as directed (Take 2 tablets by mouth in the morning, and 1 tablet by mouth in the evening.)  lisinopriL -hydroCHLOROthiazide  (ZESTORETIC ) 20-12.5 mg tablet Take 1 tablet by mouth once daily  omeprazole (PRILOSEC) 20 MG DR capsule Take 20 mg by mouth once daily  venlafaxine  (EFFEXOR -XR) 150 MG XR capsule Take 150 mg by mouth once daily 1 capsule with food  vit C,E-Zn-coppr-lutein-zeaxan (OCUVITE LUTEIN AND ZEAXANTHIN) 60 mg-13.5 mg- 15 mg-2 mg-6 mg Cap Take 1 capsule by mouth once daily   No current facility-administered medications on file prior to visit.   Family History  Problem Relation Age of Onset  Breast cancer Neg Hx    Social History   Tobacco Use  Smoking Status Never  Smokeless Tobacco Never    Social History   Socioeconomic History  Marital status: Divorced  Tobacco Use  Smoking status: Never  Smokeless tobacco: Never  Vaping Use  Vaping status: Never Used  Substance and Sexual Activity  Alcohol use: Never  Drug use: Never   Social Drivers of Health   Food Insecurity: No Food Insecurity (02/16/2024)  Received from [facility]  Hunger Vital Sign  Within the past 12 months, you worried that your food would run out before you got the money to buy  more.: Never true  Within the past 12 months, the food you bought just didn't last and you didn't have money to get more.: Never true  Transportation Needs: Unknown (02/16/2024)  Received from [facility] - Transportation  In the past 12 months, has lack of transportation kept you from meetings,  work, or from getting things needed for daily living?: No  Social Connections: Unknown (02/16/2024)  [facility] from [facility]  Social Connection and Isolation Panel  How often do you attend church or religious services?: Patient declined  Do you belong to any clubs or organizations such as church groups, unions, fraternal or athletic groups, or school groups?: Patient declined  How often do you attend meetings of the clubs or organizations you belong to?: Patient declined  Are you married, widowed, divorced, separated, never married, or living with a partner?: Patient declined  Housing Stability: Unknown (01/21/2024)  Housing Stability Vital Sign  Homeless in the Last Year: No   Objective:   There were no vitals filed for this visit.  There is no height or weight on file to calculate BMI.  Physical Exam Vitals reviewed.  Constitutional:  General: She is not in acute distress. Appearance: Normal appearance.  HENT:  Head: Normocephalic and atraumatic.  Right Ear: External ear normal.  Left Ear: External ear normal.  Nose: Nose normal.  Mouth/Throat:  Mouth: Mucous membranes are moist.  Pharynx: Oropharynx is clear.  Eyes:  General: No scleral icterus. Extraocular Movements: Extraocular movements intact.  Conjunctiva/sclera: Conjunctivae normal.  Pupils: Pupils are equal, round, and reactive to light.  Cardiovascular:  Rate and Rhythm: Normal rate and regular rhythm.  Pulses: Normal pulses.  Heart sounds: Normal heart sounds.  Pulmonary:  Effort: Pulmonary effort is normal. No respiratory distress.  Breath sounds: Normal breath sounds.  Abdominal:  General: Bowel sounds are normal.  Palpations: Abdomen is soft.  Tenderness: There is no abdominal tenderness.  Musculoskeletal:  General: No swelling, tenderness or deformity. Normal range of motion.  Cervical back: Normal range of motion and neck supple.  Skin: General: Skin is warm and dry.  Coloration: Skin is not  jaundiced.  Neurological:  General: No focal deficit present.  Mental Status: She is alert and oriented to person, place, and time.  Psychiatric:  Mood and Affect: Mood normal.  Behavior: Behavior normal.     Breast: The left mastectomy incision is healing nicely with no sign of infection. There is a minimal residual seroma under the skin flap that at this point is probably too small to try to tap. Her skin flaps are healthy.  Labs, Imaging and Diagnostic Testing:  Assessment and Plan:   Diagnoses and all orders for this visit:  Malignant neoplasm of upper-inner quadrant of left breast in female, estrogen receptor positive (CMS/HHS-HCC)    The patient is about 3 weeks status post left mastectomy for breast cancer. She tolerated the surgery well. At this point I will plan to see her back in about a month to check for an increasing seroma. I will also see her on Thursday to place her port. I have discussed with her in detail the risks and benefits of the operation as well as some of the technical aspects including pneumothorax and she understands and wishes to proceed

## 2024-03-11 NOTE — Anesthesia Procedure Notes (Addendum)
 Procedure Name: LMA Insertion Date/Time: 03/11/2024 8:44 AM  Performed by: Lanning Cena RAMAN, CRNAPre-anesthesia Checklist: Patient identified, Emergency Drugs available, Suction available, Patient being monitored and Timeout performed Patient Re-evaluated:Patient Re-evaluated prior to induction Oxygen Delivery Method: Circle system utilized Preoxygenation: Pre-oxygenation with 100% oxygen Induction Type: IV induction LMA: LMA inserted LMA Size: 4.0 Number of attempts: 1 Placement Confirmation: positive ETCO2 and breath sounds checked- equal and bilateral Tube secured with: Tape Dental Injury: Teeth and Oropharynx as per pre-operative assessment

## 2024-03-11 NOTE — Transfer of Care (Signed)
 Immediate Anesthesia Transfer of Care Note  Patient: Amber Ross  Procedure(s) Performed: INSERTION, TUNNELED CENTRAL VENOUS DEVICE, WITH PORT and ASPIRATION OF SEROMA  Patient Location: PACU  Anesthesia Type:General  Level of Consciousness: awake, alert , and oriented  Airway & Oxygen Therapy: Patient Spontanous Breathing  Post-op Assessment: Report given to RN and Post -op Vital signs reviewed and stable  Post vital signs: Reviewed and stable  Last Vitals:  Vitals Value Taken Time  BP 133/74 03/11/24 09:55  Temp    Pulse 85 03/11/24 09:58  Resp 23 03/11/24 09:58  SpO2 96 % 03/11/24 09:58  Vitals shown include unfiled device data.  Last Pain:  Vitals:   03/11/24 0751  TempSrc:   PainSc: 0-No pain         Complications: No notable events documented.

## 2024-03-11 NOTE — Op Note (Signed)
 03/11/2024  9:48 AM  PATIENT:  Amber Ross  68 y.o. female  PRE-OPERATIVE DIAGNOSIS:  LEFT BREAST CANCER  POST-OPERATIVE DIAGNOSIS:  LEFT BREAST CANCER  PROCEDURE:  Procedure(s) with comments: INSERTION, TUNNELED CENTRAL VENOUS DEVICE, WITH PORT and ASPIRATION OF SEROMA (N/A) - PORT PLACEMENT WITH ULTRASOUND GUIDANCE  SURGEON:  Surgeons and Role:    * Curvin Deward MOULD, MD - Primary  PHYSICIAN ASSISTANT:   ASSISTANTS: none   ANESTHESIA:   local and general  EBL:  5 mL   BLOOD ADMINISTERED:none  DRAINS: none   LOCAL MEDICATIONS USED:  MARCAINE      SPECIMEN:  No Specimen  DISPOSITION OF SPECIMEN:  N/A  COUNTS:  YES  TOURNIQUET:  * No tourniquets in log *  DICTATION: .Dragon Dictation  After informed consent was obtained the patient was brought to the operating room and placed in the supine position on the operating table.  After adequate induction of general anesthesia a roll was placed between the patient's shoulder blades to extend the shoulder slightly.  The patient's bilateral chest and neck area were then prepped with ChloraPrep, allowed to dry, and draped in usual sterile manner.  An appropriate timeout was performed.  Attention was first turned to the left chest wall.  There was a small seroma under her mastectomy flap.  I accessed this laterally along the chest wall with a 18-gauge needle and 10 cc syringe and was able to aspirate about 30 cc of serous fluid.  She tolerated this well.  Attention was then turned to the right chest wall.  The area lateral to the bend of the clavicle was infiltrated with quarter percent Marcaine .  The patient was placed in Trendelenburg position.  A large bore needle from the Port-A-Cath kit was used to slide beneath of the bend of the clavicle heading towards the sternal notch.  I was not able to identify the right subclavian vein.  I then used the ultrasound machine to identify the right internal jugular vein.  This was accessed with the  large-bore needle from the Port-A-Cath kit under ultrasound guidance.  A wire was fed through the needle using the Seldinger technique without difficulty.  The wire was confirmed in the central venous system using real-time fluoroscopy.  Next a small incision was made on the right chest wall with a 15 blade knife.  The incision was carried through the skin and subcutaneous tissue sharply with the electrocautery.  A subcutaneous pocket was then created inferior to the incision by blunt finger dissection.  A small incision was made at the wire entry site on the right neck.  The 2 incisions were then connected by blunt hemostat dissection.  A tendon passer was placed across the tunnel and used to bring the tubing through the tunnel.  The tubing was then placed on the reservoir.  The reservoir was placed in the pocket and the length of the tubing was estimated using real-time fluoroscopy.  The tubing was cut to the appropriate length.  Next a sheath and dilator were then fed over the wire using the Seldinger technique without difficulty.  The dilator and wire were removed from the patient.  The tubing was fed through the sheath as far as it would go and then held in place while the sheath was gently cracked and separated.  Another real-time fluoroscopy image showed the tip of the catheter to be in the distal superior vena cava.  The tubing was then permanently anchored to the reservoir.  The reservoir  was anchored in the pocket with two 2-0 Prolene stitches.  Pressure was held for several minutes until the area seemed to be hemostatic.  The incision on the neck was closed with a interrupted 4-0 Monocryl subcuticular stitch.  The subcutaneous tissue was then closed over the port with interrupted 3-0 Vicryl stitches.  These skin was closed with a running 4-0 Monocryl subcuticular stitch.  Dermabond dressings were applied.  The patient tolerated the procedure well.  At the end of the case all needle sponge and instrument  counts were correct.  The patient was then awakened and taken to recovery in stable condition.  PLAN OF CARE: Discharge to home after PACU  PATIENT DISPOSITION:  PACU - hemodynamically stable.   Delay start of Pharmacological VTE agent (>24hrs) due to surgical blood loss or risk of bleeding: not applicable

## 2024-03-12 ENCOUNTER — Encounter: Payer: Self-pay | Admitting: Surgery

## 2024-03-12 ENCOUNTER — Other Ambulatory Visit: Payer: Self-pay

## 2024-03-12 ENCOUNTER — Emergency Department (HOSPITAL_COMMUNITY)

## 2024-03-12 ENCOUNTER — Encounter (HOSPITAL_COMMUNITY): Payer: Self-pay | Admitting: General Surgery

## 2024-03-12 ENCOUNTER — Inpatient Hospital Stay (HOSPITAL_COMMUNITY)
Admission: EM | Admit: 2024-03-12 | Discharge: 2024-03-15 | DRG: 982 | Disposition: A | Discharge status: Home / Self Care | Source: Ambulatory Visit | Attending: General Surgery | Admitting: General Surgery

## 2024-03-12 DIAGNOSIS — Z1732 Human epidermal growth factor receptor 2 negative status: Secondary | ICD-10-CM

## 2024-03-12 DIAGNOSIS — I700 Atherosclerosis of aorta: Secondary | ICD-10-CM | POA: Diagnosis present

## 2024-03-12 DIAGNOSIS — Z1722 Progesterone receptor negative status: Secondary | ICD-10-CM

## 2024-03-12 DIAGNOSIS — E7800 Pure hypercholesterolemia, unspecified: Secondary | ICD-10-CM | POA: Diagnosis present

## 2024-03-12 DIAGNOSIS — C50212 Malignant neoplasm of upper-inner quadrant of left female breast: Secondary | ICD-10-CM | POA: Diagnosis present

## 2024-03-12 DIAGNOSIS — Z923 Personal history of irradiation: Secondary | ICD-10-CM

## 2024-03-12 DIAGNOSIS — R7303 Prediabetes: Secondary | ICD-10-CM | POA: Diagnosis present

## 2024-03-12 DIAGNOSIS — K219 Gastro-esophageal reflux disease without esophagitis: Secondary | ICD-10-CM | POA: Diagnosis present

## 2024-03-12 DIAGNOSIS — J939 Pneumothorax, unspecified: Secondary | ICD-10-CM | POA: Diagnosis present

## 2024-03-12 DIAGNOSIS — J95811 Postprocedural pneumothorax: Principal | ICD-10-CM | POA: Diagnosis present

## 2024-03-12 DIAGNOSIS — F909 Attention-deficit hyperactivity disorder, unspecified type: Secondary | ICD-10-CM | POA: Diagnosis present

## 2024-03-12 DIAGNOSIS — Z170 Estrogen receptor positive status [ER+]: Secondary | ICD-10-CM

## 2024-03-12 DIAGNOSIS — M96843 Postprocedural seroma of a musculoskeletal structure following other procedure: Principal | ICD-10-CM | POA: Diagnosis present

## 2024-03-12 DIAGNOSIS — N952 Postmenopausal atrophic vaginitis: Secondary | ICD-10-CM | POA: Diagnosis present

## 2024-03-12 DIAGNOSIS — I10 Essential (primary) hypertension: Secondary | ICD-10-CM | POA: Diagnosis present

## 2024-03-12 DIAGNOSIS — Q2112 Patent foramen ovale: Secondary | ICD-10-CM

## 2024-03-12 DIAGNOSIS — I2510 Atherosclerotic heart disease of native coronary artery without angina pectoris: Secondary | ICD-10-CM | POA: Diagnosis present

## 2024-03-12 DIAGNOSIS — Z79899 Other long term (current) drug therapy: Secondary | ICD-10-CM

## 2024-03-12 DIAGNOSIS — Z9012 Acquired absence of left breast and nipple: Secondary | ICD-10-CM

## 2024-03-12 DIAGNOSIS — Z87891 Personal history of nicotine dependence: Secondary | ICD-10-CM

## 2024-03-12 MED ORDER — MELATONIN 3 MG PO TABS: 3.0000 mg | ORAL_TABLET | Freq: Every evening | ORAL | Status: DC | PRN

## 2024-03-12 MED ORDER — TRAMADOL HCL 50 MG PO TABS: 50.0000 mg | ORAL_TABLET | Freq: Four times a day (QID) | ORAL | Status: DC | PRN

## 2024-03-12 MED ORDER — LISINOPRIL 20 MG PO TABS
20.0000 mg | ORAL_TABLET | Freq: Every day | ORAL | Status: DC
  Administered 2024-03-13 – 2024-03-15 (×3): 20 mg via ORAL
  Filled 2024-03-12 (×3): qty 1

## 2024-03-12 MED ORDER — DIPHENHYDRAMINE HCL 12.5 MG/5ML PO ELIX: 12.5000 mg | ORAL_SOLUTION | Freq: Four times a day (QID) | ORAL | Status: DC | PRN

## 2024-03-12 MED ORDER — AMPHETAMINE-DEXTROAMPHETAMINE 10 MG PO TABS: 10.0000 mg | ORAL_TABLET | Freq: Two times a day (BID) | ORAL | Status: DC

## 2024-03-12 MED ORDER — DIPHENHYDRAMINE HCL 50 MG/ML IJ SOLN: 12.5000 mg | Freq: Four times a day (QID) | INTRAMUSCULAR | Status: DC | PRN

## 2024-03-12 MED ORDER — PANTOPRAZOLE SODIUM 40 MG PO TBEC
40.0000 mg | DELAYED_RELEASE_TABLET | Freq: Every day | ORAL | Status: DC
  Administered 2024-03-12 – 2024-03-15 (×4): 40 mg via ORAL
  Filled 2024-03-12 (×4): qty 1

## 2024-03-12 MED ORDER — ATORVASTATIN CALCIUM 40 MG PO TABS
40.0000 mg | ORAL_TABLET | Freq: Every day | ORAL | Status: DC
  Administered 2024-03-12 – 2024-03-15 (×4): 40 mg via ORAL
  Filled 2024-03-12 (×4): qty 1

## 2024-03-12 MED ORDER — VENLAFAXINE HCL ER 150 MG PO CP24
150.0000 mg | ORAL_CAPSULE | Freq: Every day | ORAL | Status: DC
  Administered 2024-03-13 – 2024-03-15 (×3): 150 mg via ORAL
  Filled 2024-03-12 (×3): qty 1

## 2024-03-12 MED ORDER — HYDROCHLOROTHIAZIDE 12.5 MG PO TABS
12.5000 mg | ORAL_TABLET | Freq: Every day | ORAL | Status: DC
  Administered 2024-03-13 – 2024-03-15 (×3): 12.5 mg via ORAL
  Filled 2024-03-12 (×3): qty 1

## 2024-03-12 MED ORDER — ACETAMINOPHEN 500 MG PO TABS
1000.0000 mg | ORAL_TABLET | Freq: Four times a day (QID) | ORAL | Status: DC
  Administered 2024-03-12 – 2024-03-14 (×7): 1000 mg via ORAL
  Filled 2024-03-12 (×7): qty 2

## 2024-03-12 MED ORDER — METOPROLOL TARTRATE 5 MG/5ML IV SOLN: 5.0000 mg | Freq: Four times a day (QID) | INTRAVENOUS | Status: DC | PRN

## 2024-03-12 MED ORDER — POLYETHYLENE GLYCOL 3350 17 G PO PACK
17.0000 g | PACK | Freq: Every day | ORAL | Status: DC | PRN
  Administered 2024-03-14: 17 g via ORAL
  Filled 2024-03-12 (×2): qty 1

## 2024-03-12 MED ORDER — SIMETHICONE 80 MG PO CHEW: 40.0000 mg | CHEWABLE_TABLET | Freq: Four times a day (QID) | ORAL | Status: DC | PRN

## 2024-03-12 MED ORDER — ONDANSETRON 4 MG PO TBDP: 4.0000 mg | ORAL_TABLET | Freq: Four times a day (QID) | ORAL | Status: DC | PRN

## 2024-03-12 MED ORDER — ONDANSETRON HCL 4 MG/2ML IJ SOLN
4.0000 mg | Freq: Four times a day (QID) | INTRAMUSCULAR | Status: DC | PRN
  Administered 2024-03-12: 4 mg via INTRAVENOUS
  Filled 2024-03-12: qty 2

## 2024-03-12 MED ORDER — MORPHINE SULFATE (PF) 2 MG/ML IV SOLN
1.0000 mg | INTRAVENOUS | Status: DC | PRN
  Administered 2024-03-12 – 2024-03-15 (×7): 2 mg via INTRAVENOUS
  Filled 2024-03-12 (×7): qty 1

## 2024-03-12 MED ORDER — LORAZEPAM 2 MG/ML IJ SOLN
1.0000 mg | Freq: Once | INTRAMUSCULAR | Status: AC
  Administered 2024-03-12: 1 mg via INTRAVENOUS
  Filled 2024-03-12: qty 1

## 2024-03-12 MED ORDER — ENOXAPARIN SODIUM 40 MG/0.4ML IJ SOSY
40.0000 mg | PREFILLED_SYRINGE | INTRAMUSCULAR | Status: DC
  Administered 2024-03-13 – 2024-03-15 (×3): 40 mg via SUBCUTANEOUS
  Filled 2024-03-12 (×3): qty 0.4

## 2024-03-12 MED ORDER — HYDRALAZINE HCL 20 MG/ML IJ SOLN: 10.0000 mg | INTRAMUSCULAR | Status: DC | PRN

## 2024-03-12 MED ORDER — LISINOPRIL-HYDROCHLOROTHIAZIDE 20-12.5 MG PO TABS: 1.0000 | ORAL_TABLET | Freq: Every day | ORAL | Status: DC

## 2024-03-12 NOTE — ED Notes (Signed)
 Per PA Burnard pt was placed on 2 liters nasal cannula

## 2024-03-12 NOTE — Anesthesia Postprocedure Evaluation (Signed)
 Anesthesia Post Note  Patient: Amber Ross  Procedure(s) Performed: INSERTION, TUNNELED CENTRAL VENOUS DEVICE, WITH PORT and ASPIRATION OF SEROMA     Patient location during evaluation: PACU Anesthesia Type: General Level of consciousness: awake and alert Pain management: pain level controlled Vital Signs Assessment: post-procedure vital signs reviewed and stable Respiratory status: spontaneous breathing, nonlabored ventilation, respiratory function stable and patient connected to nasal cannula oxygen Cardiovascular status: blood pressure returned to baseline and stable Postop Assessment: no apparent nausea or vomiting Anesthetic complications: no   No notable events documented.  Last Vitals:  Vitals:   03/11/24 1045 03/11/24 1100  BP: 127/75 127/78  Pulse: 89 87  Resp: (!) 22 20  Temp:  37.1 C  SpO2: 91% 93%    Last Pain:  Vitals:   03/11/24 1100  TempSrc:   PainSc: 0-No pain                 Hasnain Manheim S

## 2024-03-12 NOTE — ED Provider Notes (Signed)
 [place] EMERGENCY DEPARTMENT AT [facility] Provider Note   CSN: [id number] Arrival date & time: 03/12/24  9243     Patient presents with: Back Pain and Neck Swelling   Amber Ross is a 68 y.o. female.   HPI Patient had port placed in upper right chest yesterday. Woke up this morning with swelling on both sides of her neck. Called her surgeon this morning who urged her to be seen in the ER for imaging - concern for nicking of her lung. Patient endorsing right sided upper back pain.   Denies CP, SOB, throat swelling.    Prior to Admission medications   Medication Sig Start Date End Date Taking? Authorizing Provider  amphetamine -dextroamphetamine  (ADDERALL) 10 MG tablet Take 2 tablets by mouth in the morning, and 1 tablet by mouth in the evening.    [provider]  atorvastatin  (LIPITOR) 40 MG tablet Take 1 tablet (40 mg total) by mouth daily. 10/10/21   Shlomo Wilbert SAUNDERS, MD  Biotin w/ Vitamins C & E (HAIR/SKIN/NAILS PO) Take 2 each by mouth daily. Gummies    [provider]  Cholecalciferol (VITAMIN D3) 125 MCG (5000 UT) CAPS Take 5,000 Units by mouth daily.    [provider]  dexamethasone  (DECADRON ) 4 MG tablet Take 1 tablet day after chemo and 1 tablet 2 days after chemo with food Patient not taking: Reported on 03/08/2024 03/02/24   Gudena, Vinay, MD  diazepam  (VALIUM ) 2 MG tablet Take 1 tablet (2 mg total) by mouth every 12 (twelve) hours as needed for muscle spasms. 02/02/24   Landy Honora CROME, PA-C  lidocaine -prilocaine  (EMLA ) cream Apply to affected area once Patient not taking: Reported on 03/08/2024 03/02/24   Gudena, Vinay, MD  lisinopril -hydrochlorothiazide  (ZESTORETIC ) 20-12.5 MG tablet Take 1 tablet by mouth daily.    [provider]  omeprazole (PRILOSEC) 20 MG capsule Take 20 mg by mouth daily.    [provider]  ondansetron  (ZOFRAN ) 8 MG tablet Take 1 tab (8 mg) by mouth every 8 hrs as needed for  nausea/vomiting. Start third day after doxorubicin/cyclophosphamide chemotherapy. Patient not taking: Reported on 03/08/2024 03/02/24   Gudena, Vinay, MD  ondansetron  (ZOFRAN -ODT) 4 MG disintegrating tablet Take 1 tablet (4 mg total) by mouth every 8 (eight) hours as needed for nausea or vomiting. Patient not taking: Reported on 03/08/2024 02/02/24   Landy Honora CROME, PA-C  oxyCODONE  (ROXICODONE ) 5 MG immediate release tablet Take 1 tablet (5 mg total) by mouth every 6 (six) hours as needed. 03/11/24 03/11/25  Curvin Deward MOULD, MD  prochlorperazine  (COMPAZINE ) 10 MG tablet Take 1 tablet (10 mg total) by mouth every 6 (six) hours as needed for nausea or vomiting. Patient not taking: Reported on 03/08/2024 03/02/24   Gudena, Vinay, MD  traMADol  (ULTRAM ) 50 MG tablet Take 50 mg by mouth daily as needed (Cough per Dr. Loreli).    [provider]  TURMERIC CURCUMIN PO Take 2 each by mouth daily. Gummies Patient not taking: Reported on 03/08/2024    [provider]  venlafaxine  XR (EFFEXOR -XR) 150 MG 24 hr capsule Take 150 mg by mouth daily with breakfast. 1 ca    [provider]  vitamin B-12 (CYANOCOBALAMIN) 1000 MCG tablet Take 1,000 mcg by mouth daily.    [provider]    Allergies: Patient has no known allergies.    Review of Systems  Updated Vital Signs BP (!) 181/157   Pulse 87   Temp 97.6 F (36.4 C) (  Oral)   Resp 18   SpO2 100%   Physical Exam Vitals and nursing note reviewed.  Constitutional:      General: She is not in acute distress.    Appearance: She is well-developed.  HENT:     Head: Normocephalic and atraumatic.  Eyes:     Conjunctiva/sclera: Conjunctivae normal.  Cardiovascular:     Rate and Rhythm: Normal rate and regular rhythm.  Pulmonary:     Effort: Pulmonary effort is normal. No respiratory distress.     Breath sounds: Normal breath sounds. No stridor.  Chest:    Abdominal:     General: There is no distension.  Skin:     General: Skin is warm and dry.  Neurological:     Mental Status: She is alert and oriented to person, place, and time.     Cranial Nerves: No cranial nerve deficit.  Psychiatric:        Mood and Affect: Mood normal.     (all labs ordered are listed, but only abnormal results are displayed) Labs Reviewed  HIV ANTIBODY (ROUTINE TESTING W REFLEX)    EKG: None  Radiology: DG Chest Portable 1 View Result Date: 03/12/2024 EXAM: 1 VIEW(S) XRAY OF THE CHEST 03/12/2024 10:42:00 AM COMPARISON: 03/12/2024 CLINICAL HISTORY: post chest tube insertion FINDINGS: LINES, TUBES AND DEVICES: Right chest port with catheter tip at superior cavoatrial junction. Interval placement of right apical chest tube. LUNGS AND PLEURA: No focal pulmonary opacity. No pleural effusion. Trace right apical pneumothorax. HEART AND MEDIASTINUM: Persistent pneumomediastinum. No acute abnormality of the cardiac silhouette. BONES AND SOFT TISSUES: Left axillary surgical clips. Extensive chest wall subcutaneous emphysema. No acute osseous abnormality. IMPRESSION: 1. Trace right apical pneumothorax. 2. Persistent pneumomediastinum. 3. Extensive chest wall subcutaneous emphysema. 4. Interval placement of right apical chest tube. Electronically signed by: Katheleen Faes MD 03/12/2024 11:42 AM EST RP Workstation: [id number]   CT Chest Wo Contrast Result Date: 03/12/2024 EXAM: CT CHEST WITHOUT CONTRAST 03/12/2024 09:31:00 AM TECHNIQUE: CT of the chest was performed without the administration of intravenous contrast. Multiplanar reformatted images are provided for review. Automated exposure control, iterative reconstruction, and/or weight based adjustment of the mA/kV was utilized to reduce the radiation dose to as low as reasonably achievable. COMPARISON: None available. CLINICAL HISTORY: Pneumothorax suspected; poss PTX s/p port placement. FINDINGS: MEDIASTINUM: Heart and pericardium are unremarkable. Extensive pneumomediastinum is noted.  The central airways are clear. Port-a-cath tip is at the cavo-atrial junction. LYMPH NODES: No mediastinal, hilar or axillary lymphadenopathy. LUNGS AND PLEURA: No focal consolidation or pulmonary edema. Large right apical and basilar pneumothorax is noted. Small right pleural effusion is noted. SOFT TISSUES/BONES: Extensive pneumomediastinum extends into supraclavicular and cervical soft tissues as well as the right lateral chest wall. A Port-a-cath is noted in the right upper chest. No acute abnormality of the bones. UPPER ABDOMEN: Limited images of the upper abdomen demonstrates no acute abnormality. IMPRESSION: 1. Large right apical and basilar pneumothorax. This finding was discussed with doctor Garrick at 11:16 am on 03/12/2024. 2. Extensive pneumomediastinum extending into supraclavicular and cervical soft tissues as well as right lateral chest wall. 3. Small right pleural effusion. Electronically signed by: Lynwood Seip MD 03/12/2024 11:16 AM EST RP Workstation: [id number]   DG Chest 2 View Result Date: 03/12/2024 EXAM: 2 VIEW(S) XRAY OF THE CHEST 03/12/2024 08:43:00 AM COMPARISON: 03/11/2024 CLINICAL HISTORY: post port placement with CP FINDINGS: LINES, TUBES AND DEVICES: Right chest wall port in place with tip overlying the expected region of  the distal SVC. LUNGS AND PLEURA: No focal pulmonary opacity. Small right apical pneumothorax. No pleural effusion. HEART AND MEDIASTINUM: Interval development of pneumomediastinum. BONES AND SOFT TISSUES: No acute osseous abnormality. Left chest wall surgical clips noted. Moderate right greater than left chest wall subcutaneous emphysema. Subcutaneous emphysema is also noted within bilateral lower neck regions. IMPRESSION: 1. Status post right sided port-a-cath placement with tip projecting over the distal SVC. 2.  Interval development small right apical pneumothorax and pneumomediastinum. 3. Moderate right greater than left chest wall subcutaneous emphysema,  extending into the bilateral lower neck regions. 4. Critical results were called to the ordering provider at the time of interpretation who acknowledged these findings on 03/12/2024 at 9:03 am? I personally spoke with Dr. garrick . Electronically signed by: Waddell Calk MD 03/12/2024 09:03 AM EST RP Workstation: [id number]   DG CHEST PORT 1 VIEW Result Date: 03/11/2024 CLINICAL DATA:  Port-A-Cath placement. EXAM: PORTABLE CHEST 1 VIEW COMPARISON:  06/15/2020. FINDINGS: Right-sided Port-A-Cath tip at the lower SVC. No pneumothorax. No focal consolidation or pleural effusion. The heart size and mediastinal contours are within normal limits. The visualized skeletal structures are unremarkable. Postoperative changes related to left mastectomy with surgical clips in the left axilla. IMPRESSION: 1. Right-sided Port-A-Cath tip at the lower SVC.  No pneumothorax. Electronically Signed   By: Harrietta Sherry M.D.   On: 03/11/2024 10:55   DG C-Arm 1-60 Min-No Report Result Date: 03/11/2024 Fluoroscopy was utilized by the requesting physician.  No radiographic interpretation.     Procedures   Medications Ordered in the ED  enoxaparin  (LOVENOX ) injection 40 mg (has no administration in time range)  acetaminophen  (TYLENOL ) tablet 1,000 mg (1,000 mg Oral Given 03/12/24 1401)  traMADol  (ULTRAM ) tablet 50 mg (has no administration in time range)  morphine  (PF) 2 MG/ML injection 1-2 mg (has no administration in time range)  melatonin tablet 3 mg (has no administration in time range)  diphenhydrAMINE  (BENADRYL ) 12.5 MG/5ML elixir 12.5 mg (has no administration in time range)    Or  diphenhydrAMINE  (BENADRYL ) injection 12.5 mg (has no administration in time range)  polyethylene glycol (MIRALAX  / GLYCOLAX ) packet 17 g (has no administration in time range)  ondansetron  (ZOFRAN -ODT) disintegrating tablet 4 mg (has no administration in time range)    Or  ondansetron  (ZOFRAN ) injection 4 mg (has no  administration in time range)  simethicone  (MYLICON) chewable tablet 40 mg (has no administration in time range)  metoprolol  tartrate (LOPRESSOR ) injection 5 mg (has no administration in time range)  hydrALAZINE  (APRESOLINE ) injection 10 mg (has no administration in time range)  atorvastatin  (LIPITOR) tablet 40 mg (has no administration in time range)  lisinopril -hydrochlorothiazide  (ZESTORETIC ) 20-12.5 MG per tablet 1 tablet (has no administration in time range)  amphetamine -dextroamphetamine  (ADDERALL) tablet 10 mg (has no administration in time range)  venlafaxine  XR (EFFEXOR -XR) 24 hr capsule 150 mg (has no administration in time range)  pantoprazole  (PROTONIX ) EC tablet 40 mg (has no administration in time range)  LORazepam  (ATIVAN ) injection 1 mg (1 mg Intravenous Given 03/12/24 1014)                                    Medical Decision Making Adult female presents 2 days after placement of chemotherapy port now with neck swelling, pain, upper chest swelling, pain.  She is awake, alert, in no distress, speaking clearly, no overt evidence for bacteremia, sepsis concern for subcu emphysema  versus pneumothorax, though an infection is a consideration. Initial vitals reassuring Cardiac 90 sinus normal pulse ox 95% room air normal  Amount and/or Complexity of Data Reviewed Independent Historian:     Details: Friend at bedside External Data Reviewed: notes.    Details: Notes Radiology: ordered and independent interpretation performed. Decision-making details documented in ED Course. Discussion of management or test interpretation with external provider(s): I discussed patient's case with her surgical team prior to her arrival and following x-ray results.  Risk Decision regarding hospitalization.   Update: Patient in similar condition, x-ray reviewed  Update: After initial discussion, review of x-ray, patient had CT performed.  I discussed the x-ray results, and CT results with our  radiologist, and with our general surgery team, PA, attending. Given concern for substantial pneumothorax, visible better on CT, patient had chest tube, required in the ED, was admitted to the surgical team for further monitoring, management.  CRITICAL CARE Performed by: Lamar Salen Total critical care time: 45 minutes Critical care time was exclusive of separately billable procedures and treating other patients. Critical care was necessary to treat or prevent imminent or life-threatening deterioration. Critical care was time spent personally by me on the following activities: development of treatment plan with patient and/or surrogate as well as nursing, discussions with consultants, evaluation of patient's response to treatment, examination of patient, obtaining history from patient or surrogate, ordering and performing treatments and interventions, ordering and review of laboratory studies, ordering and review of radiographic studies, pulse oximetry and re-evaluation of patient's condition.   Final diagnoses:  Postprocedural pneumothorax    ED Discharge Orders     None          Salen Lamar, MD 03/12/24 1536

## 2024-03-12 NOTE — H&P (Addendum)
 Amber Ross 1955-07-28  [id number].    Chief Complaint/Reason for Consult: right PTX s/p PAC placement  HPI:  This is a 68 yo female who is being followed by Dr. Curvin for breast cancer who underwent a PAC placement yesterday.  Procedure went well and post op CXR was negative for PTX.  Overnight she developed edema in her neck.  She denies shortness of breath, but admits to coughing.  She presented to the ED for evaluation and was found to have a large R PTX.  We are asked to see her for admission.  ROS: ROS: see HPI  Family History  Problem Relation Age of Onset   Dementia Mother    Fibroids Mother    Breast cancer Maternal Aunt    Lung cancer Maternal Aunt    Cancer Maternal Uncle        Nasal   Stroke Maternal Uncle    Breast cancer Half-Sister    Cancer Half-Sister        NOS   Cancer Half-Sister        NOS    Past Medical History:  Diagnosis Date   ADHD    Agatston coronary artery calcium  score less than 100    coronary CTA showed minimal plaque in the pLAD 07/2020   Aortic atherosclerosis    Atrophic vaginitis    Breast cancer (HCC)    left breast cancer 20 yrs ago   Coronary artery disease    minimal CAD in LAD   Cough    COVID 05/17/2019   Elevated cholesterol    Elevated cholesterol    Estrogen deficiency    Family history of breast cancer    Gastric reflux    GERD (gastroesophageal reflux disease)    History of breast cancer    Hypertension    Osteopenia    Personal history of radiation therapy    PFO (patent foramen ovale)    small and noted on cardiac CTA   Psoriasis    Sleep apnea    Mild. No CPAP   SOB (shortness of breath)     Past Surgical History:  Procedure Laterality Date   BREAST BIOPSY Left 01/13/2024   US  LT BREAST BX W LOC DEV 1ST LESION IMG BX SPEC US  GUIDE 01/13/2024 [facility] MAMMOGRAPHY   BREAST LUMPECTOMY Left 2001   BREAST REDUCTION SURGERY Right 09/28/2018   Procedure: MAMMARY REDUCTION  (BREAST);  Surgeon: Lowery Estefana RAMAN, DO;  Location: [facility];  Service: Plastics;  Laterality: Right;   CESAREAN SECTION  1992   FACIAL COSMETIC SURGERY     MASTECTOMY W/ SENTINEL NODE BIOPSY Left 02/16/2024   Procedure: MASTECTOMY WITH SENTINEL LYMPH NODE BIOPSY;  Surgeon: Curvin Deward MOULD, MD;  Location: [facility];  Service: General;  Laterality: Left;  GEN w/PEC BLOCK LEFT MASTECTOMY SENTINEL NODE BIOPSY   MASTOPEXY Left 09/28/2018   Procedure: MASTOPEXY;  Surgeon: Lowery Estefana RAMAN, DO;  Location: [facility];  Service: Plastics;  Laterality: Left;   PORTACATH PLACEMENT N/A 03/11/2024   Procedure: INSERTION, TUNNELED CENTRAL VENOUS DEVICE, WITH PORT and ASPIRATION OF SEROMA;  Surgeon: Curvin Deward MOULD, MD;  Location: [facility] OR;  Service: General;  Laterality: N/A;  PORT PLACEMENT WITH ULTRASOUND GUIDANCE   TUBAL LIGATION      Social History:  reports that she quit smoking about 34 years ago. Her smoking use included cigarettes. She started smoking about 49 years ago. She has a 3.8 pack-year smoking history.  She has never used smokeless tobacco. She reports current alcohol use. She reports that she does not use drugs.  Allergies: No Known Allergies  (Not in a hospital admission)    Physical Exam: Blood pressure (!) 181/80, pulse 86, temperature 98.4 F (36.9 C), temperature source Oral, resp. rate 18, SpO2 94%. General: pleasant, WD, WN white female who is laying in bed in NAD HEENT: head is normocephalic, atraumatic.  Sclera are noninjected.  PERRL.  Ears and nose without any masses or lesions.  Mouth is pink and moist.  Crepitus in her neck bilaterally.  Trachea is midline. Heart: regular, rate, and rhythm.  Normal s1,s2. No obvious murmurs, gallops, or rubs noted.   Lungs: CTAB but no breath sounds noted in right upper chest, no wheezes, rhonchi, or rales noted.  Respiratory effort nonlabored and sats in low 90s on RA.  PAC site on chest and neck are c/d/I with dermabond.  Chest wall  crepitus and edema noted on right side as well. Psych: A&Ox3 with an appropriate affect.   Results for orders placed or performed during the hospital encounter of 03/11/24 (from the past 48 hours)  CBC per protocol     Status: Abnormal   Collection Time: 03/11/24  7:51 AM  Result Value Ref Range   WBC 5.6 4.0 - 10.5 K/uL   RBC 4.39 3.87 - 5.11 MIL/uL   Hemoglobin 12.7 12.0 - 15.0 g/dL   HCT 61.1 63.9 - 53.9 %   MCV 88.4 80.0 - 100.0 fL   MCH 28.9 26.0 - 34.0 pg   MCHC 32.7 30.0 - 36.0 g/dL   RDW 88.0 88.4 - 84.4 %   Platelets 134 (L) 150 - 400 K/uL   nRBC 0.0 0.0 - 0.2 %    Comment: Performed at [facility], [address] [place], [place] [address]  Basic metabolic panel per protocol     Status: Abnormal   Collection Time: 03/11/24  7:51 AM  Result Value Ref Range   Sodium 141 135 - 145 mmol/L   Potassium 3.6 3.5 - 5.1 mmol/L   Chloride 101 98 - 111 mmol/L   CO2 23 22 - 32 mmol/L   Glucose, Bld 96 70 - 99 mg/dL    Comment: Glucose reference range applies only to samples taken after fasting for at least 8 hours.   BUN 17 8 - 23 mg/dL   Creatinine, Ser 9.32 0.44 - 1.00 mg/dL   Calcium  9.5 8.9 - 10.3 mg/dL   GFR, Estimated >39 >39 mL/min    Comment: (NOTE) Calculated using the CKD-EPI Creatinine Equation (2021)    Anion gap 17 (H) 5 - 15    Comment: Performed at [facility], [address], [place], [place] [address]   DG Chest 2 View Result Date: 03/12/2024 EXAM: 2 VIEW(S) XRAY OF THE CHEST 03/12/2024 08:43:00 AM COMPARISON: 03/11/2024 CLINICAL HISTORY: post port placement with CP FINDINGS: LINES, TUBES AND DEVICES: Right chest wall port in place with tip overlying the expected region of the distal SVC. LUNGS AND PLEURA: No focal pulmonary opacity. Small right apical pneumothorax. No pleural effusion. HEART AND MEDIASTINUM: Interval development of pneumomediastinum. BONES AND SOFT TISSUES: No acute osseous abnormality. Left chest wall surgical clips noted.  Moderate right greater than left chest wall subcutaneous emphysema. Subcutaneous emphysema is also noted within bilateral lower neck regions. IMPRESSION: 1. Status post right sided port-a-cath placement with tip projecting over the distal SVC. 2.  Interval development small right apical  pneumothorax and pneumomediastinum. 3. Moderate right greater than left chest wall subcutaneous emphysema, extending into the bilateral lower neck regions. 4. Critical results were called to the ordering provider at the time of interpretation who acknowledged these findings on 03/12/2024 at 9:03 am? I personally spoke with Dr. garrick . Electronically signed by: Waddell Calk MD 03/12/2024 09:03 AM EST RP Workstation: [id number]   DG CHEST PORT 1 VIEW Result Date: 03/11/2024 CLINICAL DATA:  Port-A-Cath placement. EXAM: PORTABLE CHEST 1 VIEW COMPARISON:  06/15/2020. FINDINGS: Right-sided Port-A-Cath tip at the lower SVC. No pneumothorax. No focal consolidation or pleural effusion. The heart size and mediastinal contours are within normal limits. The visualized skeletal structures are unremarkable. Postoperative changes related to left mastectomy with surgical clips in the left axilla. IMPRESSION: 1. Right-sided Port-A-Cath tip at the lower SVC.  No pneumothorax. Electronically Signed   By: Harrietta Sherry M.D.   On: 03/11/2024 10:55   DG C-Arm 1-60 Min-No Report Result Date: 03/11/2024 Fluoroscopy was utilized by the requesting physician.  No radiographic interpretation.      Assessment/Plan Right PTX s/p PAC placement by Dr. Curvin on 11/20 The patient has been seen, examined, labs, vitals, chart, and imaging personally reviewed.  She has a large anterior PTX with significant subcutaneous air.  She is hemodynamically stable, but will require chest tube placement.  This was done at the bedside with a pigtail catheter.  She does have an airleak noted on exam currently.  She will be admitted for chest tube management for  this PTX.  Patient and her sister at the bedside understand this plan and are in agreement.   FEN - regular VTE - lovenox  to start tomorrow ID - none currently needed Admit - inpatient, progressive for continuous pulse ox monitoring  HTN - resume home meds Breast cancer  I reviewed nursing notes, ED provider notes, last 24 h vitals and pain scores, last 48 h intake and output, last 24 h labs and trends, and last 24 h imaging results.  Burnard FORBES Banter, [facility] 03/12/2024, 11:07 AM Please see Amion for pager number during day hours 7:00am-4:30pm or 7:00am -11:30am on weekends

## 2024-03-12 NOTE — Procedures (Signed)
 Insertion of Chest Tube Procedure Note  Shirell Struthers  [id number]  1955/12/23  Date:03/12/24  Time:11:14 AM    Provider Performing: Burnard FORBES Banter   Procedure: Chest Tube Insertion [phone])  Indication(s) Pneumothorax  Consent Risks of the procedure as well as the alternatives and risks of each were explained to the patient and/or caregiver.  Consent for the procedure was obtained and is signed in the bedside chart  Anesthesia Topical only with 1% lidocaine     Time Out Verified patient identification, verified procedure, site/side was marked, verified correct patient position, special equipment/implants available, medications/allergies/relevant history reviewed, required imaging and test results available.   Sterile Technique Maximal sterile technique including full sterile barrier drape, hand hygiene, sterile gown, sterile gloves, mask, hair covering, sterile ultrasound probe cover (if used).   Procedure Description Ultrasound not used to identify appropriate pleural anatomy for placement and overlying skin marked. Area of placement cleaned and draped in sterile fashion.  A 14 French pigtail pleural catheter was placed into the right pleural space using Seldinger technique. Appropriate return of air was obtained.  The tube was connected to atrium and placed on -20 cm H2O wall suction.   Complications/Tolerance None; patient tolerated the procedure well. Chest X-ray is ordered to verify placement.   EBL Minimal  Specimen(s) none   Burnard FORBES Banter, PA-C 11:16 AM 03/12/2024

## 2024-03-12 NOTE — ED Triage Notes (Signed)
 Patient had port placed in upper right chest yesterday. Woke up this morning with swelling on both sides of her neck. Called her surgeon this morning who urged her to be seen in the ER for imaging - concern for nicking of her lung. Patient endorsing right sided upper back pain.  Denies CP, SOB, throat swelling.

## 2024-03-12 NOTE — Plan of Care (Signed)
  Problem: Education: Goal: Knowledge of General Education information will improve Description: Including pain rating scale, medication(s)/side effects and non-pharmacologic comfort measures Outcome: Progressing   Problem: Clinical Measurements: Goal: Ability to maintain clinical measurements within normal limits will improve Outcome: Progressing   Problem: Nutrition: Goal: Adequate nutrition will be maintained Outcome: Progressing   Problem: Pain Managment: Goal: General experience of comfort will improve and/or be controlled Outcome: Progressing   Problem: Safety: Goal: Ability to remain free from injury will improve Outcome: Progressing   Problem: Skin Integrity: Goal: Risk for impaired skin integrity will decrease Outcome: Progressing

## 2024-03-13 ENCOUNTER — Encounter (HOSPITAL_COMMUNITY): Payer: Self-pay | Admitting: General Surgery

## 2024-03-13 ENCOUNTER — Inpatient Hospital Stay (HOSPITAL_COMMUNITY)

## 2024-03-13 LAB — CBC
HCT: 41.8 % (ref 36.0–46.0)
Hemoglobin: 13.2 g/dL (ref 12.0–15.0)
MCH: 28.5 pg (ref 26.0–34.0)
MCHC: 31.6 g/dL (ref 30.0–36.0)
MCV: 90.3 fL (ref 80.0–100.0)
Platelets: 131 K/uL — ABNORMAL LOW (ref 150–400)
RBC: 4.63 MIL/uL (ref 3.87–5.11)
RDW: 12.2 % (ref 11.5–15.5)
WBC: 8.2 K/uL (ref 4.0–10.5)
nRBC: 0 % (ref 0.0–0.2)

## 2024-03-13 LAB — BASIC METABOLIC PANEL WITH GFR
Anion gap: 8 (ref 5–15)
BUN: 12 mg/dL (ref 8–23)
CO2: 29 mmol/L (ref 22–32)
Calcium: 9.4 mg/dL (ref 8.9–10.3)
Chloride: 102 mmol/L (ref 98–111)
Creatinine, Ser: 0.62 mg/dL (ref 0.44–1.00)
GFR, Estimated: >60 mL/min (ref >60)
Glucose, Bld: 117 mg/dL — ABNORMAL HIGH (ref 70–99)
Potassium: 3.8 mmol/L (ref 3.5–5.1)
Sodium: 139 mmol/L (ref 135–145)

## 2024-03-13 LAB — HIV ANTIBODY (ROUTINE TESTING W REFLEX): HIV Screen 4th Generation wRfx: NONREACTIVE

## 2024-03-13 MED ORDER — AMPHETAMINE-DEXTROAMPHETAMINE 10 MG PO TABS: 10.0000 mg | ORAL_TABLET | Freq: Two times a day (BID) | ORAL | Status: DC | PRN

## 2024-03-13 NOTE — Plan of Care (Signed)

## 2024-03-13 NOTE — Progress Notes (Signed)
 Subjective/Chief Complaint: Feels better, less subcutaneous air   Objective: Vital signs in last 24 hours: Temp:  [97.6 F (36.4 C)-99.2 F (37.3 C)] 97.9 F (36.6 C) (11/22 0626) Pulse Rate:  [76-94] 86 (11/22 0625) Resp:  [16-20] 16 (11/22 0625) BP: (124-181)/(73-157) 146/87 (11/22 0625) SpO2:  [94 %-100 %] 96 % (11/22 0625) Weight:  [76.2 kg] 76.2 kg (11/21 2300) Last BM Date : 03/10/24  Intake/Output from previous day: 11/21 0701 - 11/22 0700 In: 360 [P.O.:360] Out: 250 [Urine:250] Intake/Output this shift: No intake/output data recorded.  General nad Chest tube with no real output, air leak minimal but still present   Lab Results:  Recent Labs    03/11/24 0751 03/13/24 0425  WBC 5.6 8.2  HGB 12.7 13.2  HCT 38.8 41.8  PLT 134* 131*   BMET Recent Labs    03/11/24 0751 03/13/24 0425  NA 141 139  K 3.6 3.8  CL 101 102  CO2 23 29  GLUCOSE 96 117*  BUN 17 12  CREATININE 0.67 0.62  CALCIUM  9.5 9.4   PT/INR No results for input(s): LABPROT, INR in the last 72 hours. ABG No results for input(s): PHART, HCO3 in the last 72 hours.  Invalid input(s): PCO2, PO2  Studies/Results: DG Chest Port 1 View Result Date: 03/13/2024 EXAM: 1 VIEW(S) XRAY OF THE CHEST 03/13/2024 03:43:00 AM COMPARISON: 03/12/2024 CLINICAL HISTORY: Pneumothorax, right. FINDINGS: LINES, TUBES AND DEVICES: Right Port-A-Cath stable in place. Right pigtail chest tube stable in place. LUNGS AND PLEURA: Trace right apical pneumothorax, similar. No focal pulmonary opacity. No pleural effusion. HEART AND MEDIASTINUM: Pneumomediastinum again noted but improved. BONES AND SOFT TISSUES: Left axillary clips noted. Extensive subcutaneous emphysema along the right chest wall. No acute osseous abnormality. IMPRESSION: 1. Trace right apical pneumothorax, similar to prior study. 2. Pneumomediastinum, improved. 3. Extensive right chest wall subcutaneous emphysema, unchanged. Electronically  signed by: Oneil Devonshire MD 03/13/2024 03:47 AM EST RP Workstation: [id number]   DG Chest Portable 1 View Result Date: 03/12/2024 EXAM: 1 VIEW(S) XRAY OF THE CHEST 03/12/2024 10:42:00 AM COMPARISON: 03/12/2024 CLINICAL HISTORY: post chest tube insertion FINDINGS: LINES, TUBES AND DEVICES: Right chest port with catheter tip at superior cavoatrial junction. Interval placement of right apical chest tube. LUNGS AND PLEURA: No focal pulmonary opacity. No pleural effusion. Trace right apical pneumothorax. HEART AND MEDIASTINUM: Persistent pneumomediastinum. No acute abnormality of the cardiac silhouette. BONES AND SOFT TISSUES: Left axillary surgical clips. Extensive chest wall subcutaneous emphysema. No acute osseous abnormality. IMPRESSION: 1. Trace right apical pneumothorax. 2. Persistent pneumomediastinum. 3. Extensive chest wall subcutaneous emphysema. 4. Interval placement of right apical chest tube. Electronically signed by: Katheleen Faes MD 03/12/2024 11:42 AM EST RP Workstation: [id number]   CT Chest Wo Contrast Result Date: 03/12/2024 EXAM: CT CHEST WITHOUT CONTRAST 03/12/2024 09:31:00 AM TECHNIQUE: CT of the chest was performed without the administration of intravenous contrast. Multiplanar reformatted images are provided for review. Automated exposure control, iterative reconstruction, and/or weight based adjustment of the mA/kV was utilized to reduce the radiation dose to as low as reasonably achievable. COMPARISON: None available. CLINICAL HISTORY: Pneumothorax suspected; poss PTX s/p port placement. FINDINGS: MEDIASTINUM: Heart and pericardium are unremarkable. Extensive pneumomediastinum is noted. The central airways are clear. Port-a-cath tip is at the cavo-atrial junction. LYMPH NODES: No mediastinal, hilar or axillary lymphadenopathy. LUNGS AND PLEURA: No focal consolidation or pulmonary edema. Large right apical and basilar pneumothorax is noted. Small right pleural effusion is noted. SOFT  TISSUES/BONES: Extensive pneumomediastinum extends into  supraclavicular and cervical soft tissues as well as the right lateral chest wall. A Port-a-cath is noted in the right upper chest. No acute abnormality of the bones. UPPER ABDOMEN: Limited images of the upper abdomen demonstrates no acute abnormality. IMPRESSION: 1. Large right apical and basilar pneumothorax. This finding was discussed with doctor Garrick at 11:16 am on 03/12/2024. 2. Extensive pneumomediastinum extending into supraclavicular and cervical soft tissues as well as right lateral chest wall. 3. Small right pleural effusion. Electronically signed by: Lynwood Seip MD 03/12/2024 11:16 AM EST RP Workstation: [id number]   DG Chest 2 View Result Date: 03/12/2024 EXAM: 2 VIEW(S) XRAY OF THE CHEST 03/12/2024 08:43:00 AM COMPARISON: 03/11/2024 CLINICAL HISTORY: post port placement with CP FINDINGS: LINES, TUBES AND DEVICES: Right chest wall port in place with tip overlying the expected region of the distal SVC. LUNGS AND PLEURA: No focal pulmonary opacity. Small right apical pneumothorax. No pleural effusion. HEART AND MEDIASTINUM: Interval development of pneumomediastinum. BONES AND SOFT TISSUES: No acute osseous abnormality. Left chest wall surgical clips noted. Moderate right greater than left chest wall subcutaneous emphysema. Subcutaneous emphysema is also noted within bilateral lower neck regions. IMPRESSION: 1. Status post right sided port-a-cath placement with tip projecting over the distal SVC. 2.  Interval development small right apical pneumothorax and pneumomediastinum. 3. Moderate right greater than left chest wall subcutaneous emphysema, extending into the bilateral lower neck regions. 4. Critical results were called to the ordering provider at the time of interpretation who acknowledged these findings on 03/12/2024 at 9:03 am? I personally spoke with Dr. garrick . Electronically signed by: Waddell Calk MD 03/12/2024 09:03 AM EST RP  Workstation: [id number]   DG CHEST PORT 1 VIEW Result Date: 03/11/2024 CLINICAL DATA:  Port-A-Cath placement. EXAM: PORTABLE CHEST 1 VIEW COMPARISON:  06/15/2020. FINDINGS: Right-sided Port-A-Cath tip at the lower SVC. No pneumothorax. No focal consolidation or pleural effusion. The heart size and mediastinal contours are within normal limits. The visualized skeletal structures are unremarkable. Postoperative changes related to left mastectomy with surgical clips in the left axilla. IMPRESSION: 1. Right-sided Port-A-Cath tip at the lower SVC.  No pneumothorax. Electronically Signed   By: Harrietta Sherry M.D.   On: 03/11/2024 10:55   DG C-Arm 1-60 Min-No Report Result Date: 03/11/2024 Fluoroscopy was utilized by the requesting physician.  No radiographic interpretation.    Anti-infectives: Anti-infectives (From admission, onward)    None       Assessment/Plan: Iatrogenic PTX s/p port -cxr better, symptoms better, still with small air leak -will keep to suction today and recheck cxr in am -wean oxygen to off -dc monitor -oob   Donnice Bury 03/13/2024

## 2024-03-14 ENCOUNTER — Inpatient Hospital Stay (HOSPITAL_COMMUNITY)

## 2024-03-14 MED ORDER — ACETAMINOPHEN 325 MG PO TABS
650.0000 mg | ORAL_TABLET | Freq: Four times a day (QID) | ORAL | Status: DC | PRN
  Administered 2024-03-14 (×2): 650 mg via ORAL
  Filled 2024-03-14 (×2): qty 2

## 2024-03-14 NOTE — TOC Initial Note (Signed)
 Transition of Care [facility]) - Initial/Assessment Note    Patient Details  Name: Amber Ross MRN: [id number] Date of Birth: 1956/03/20  [facility] CM/SW Contact:    Sonda Manuella Quill, RN Phone Number: 03/14/2024, 5:44 PM  Clinical Narrative:                 [place] w/ pt, dtr Laymon Mains  [phone]), and family at bedside; pt said she lives at home; she plans to return at d/c w/ support from her family; her dtr will provide transportation; pt verified insurance/PCP; she denied SDOH risks; pt does not have DME, HH services, or home oxygen; IP CM will follow.   Expected Discharge Plan: Home/Self Care Barriers to Discharge: Continued Medical Work up   Patient Goals and CMS Choice Patient states their goals for this hospitalization and ongoing recovery are:: home          Expected Discharge Plan and Services   Discharge Planning Services: CM Consult   Living arrangements for the past 2 months: Single Family Home                 DME Arranged: N/A DME Agency: NA       HH Arranged: NA HH Agency: NA        Prior Living Arrangements/Services Living arrangements for the past 2 months: Single Family Home Lives with:: Self Patient language and need for interpreter reviewed:: Yes Do you feel safe going back to the place where you live?: Yes      Need for Family Participation in Patient Care: Yes (Comment) Care giver support system in place?: Yes (comment) Current home services:  (n/a) Criminal Activity/Legal Involvement Pertinent to Current Situation/Hospitalization: No - Comment as needed  Activities of Daily Living   ADL Screening (condition at time of admission) Independently performs ADLs?: Yes (appropriate for developmental age) Is the patient deaf or have difficulty hearing?: No Does the patient have difficulty seeing, even when wearing glasses/contacts?: No Does the patient have difficulty concentrating, remembering, or making  decisions?: No  Permission Sought/Granted Permission sought to share information with : Case Manager Permission granted to share information with : Yes, Verbal Permission Granted  Share Information with NAME: Case Manager     Permission granted to share info w Relationship: Laymon Mains (dtr) [phone]     Emotional Assessment Appearance:: Appears stated age Attitude/Demeanor/Rapport: Gracious Affect (typically observed): Accepting Orientation: : Oriented to Self, Oriented to Place, Oriented to  Time, Oriented to Situation Alcohol / Substance Use: Not Applicable Psych Involvement: No (comment)  Admission diagnosis:  Pneumothorax, right [J93.9] Postprocedural pneumothorax [J95.811] Patient Active Problem List   Diagnosis Date Noted   Pneumothorax, right 03/12/2024   Cancer of left female breast (HCC) 02/16/2024   Genetic testing 01/29/2024   Family history of breast cancer    Malignant neoplasm of upper-inner quadrant of left breast in female, estrogen receptor positive (HCC) 01/19/2024   Aortic atherosclerosis    PFO (patent foramen ovale)    Agatston coronary artery calcium  score less than 100    Postoperative breast asymmetry 06/05/2018   History of breast cancer in female 08/10/2015   Upper airway cough syndrome 11/17/2013   PCP:  Loreli Kins, MD Pharmacy:   [id number] - [place], [place] - [address] [place] [address] Phone: [phone] Fax: [phone]     Social Drivers of Health (SDOH) Social History: SDOH  Screenings   Food Insecurity: No Food Insecurity (03/14/2024)  Housing: Low Risk  (03/14/2024)  Transportation Needs: No Transportation Needs (03/14/2024)  Utilities: Not At Risk (03/14/2024)  Depression (PHQ2-9): Low Risk  (01/21/2024)  Social Connections: Unknown (03/12/2024)  Tobacco Use: Medium Risk (03/13/2024)   SDOH Interventions: Food Insecurity  Interventions: Intervention Not Indicated, Inpatient TOC Housing Interventions: Intervention Not Indicated, Inpatient TOC Transportation Interventions: Intervention Not Indicated, Inpatient TOC Utilities Interventions: Intervention Not Indicated, Inpatient TOC   Readmission Risk Interventions     No data to display

## 2024-03-14 NOTE — Progress Notes (Signed)
 Subjective/Chief Complaint: Feels much better, less back pain, subcutaneous air better, did not get oob yesterday due to pain   Objective: Vital signs in last 24 hours: Temp:  [97.6 F (36.4 C)-97.9 F (36.6 C)] 97.8 F (36.6 C) (11/23 0509) Pulse Rate:  [80-92] 80 (11/23 0509) Resp:  [15-20] 18 (11/23 0509) BP: (125-149)/(58-94) 135/87 (11/23 0509) SpO2:  [89 %-97 %] 97 % (11/23 0509) Last BM Date : 03/10/24  Intake/Output from previous day: 11/22 0701 - 11/23 0700 In: 480 [P.O.:480] Out: 1850 [Urine:1850] Intake/Output this shift: No intake/output data recorded.  General nad Cv regular Pulm effort normal, no output in tube, no air leak seen this am  Lab Results:  Recent Labs    03/13/24 0425  WBC 8.2  HGB 13.2  HCT 41.8  PLT 131*   BMET Recent Labs    03/13/24 0425  NA 139  K 3.8  CL 102  CO2 29  GLUCOSE 117*  BUN 12  CREATININE 0.62  CALCIUM  9.4   PT/INR No results for input(s): LABPROT, INR in the last 72 hours. ABG No results for input(s): PHART, HCO3 in the last 72 hours.  Invalid input(s): PCO2, PO2  Studies/Results: DG CHEST PORT 1 VIEW Result Date: 03/14/2024 EXAM: 1 VIEW(S) XRAY OF THE CHEST 03/14/2024 04:10:00 AM COMPARISON: 03/13/2024 CLINICAL HISTORY: Pneumothorax FINDINGS: LINES, TUBES AND DEVICES: Right central venous catheter in place with tip in superior cavoatrial junction. Right pigtail chest tube stable in position. LUNGS AND PLEURA: Persistent trace right apical pneumothorax. No focal pulmonary opacity. No pleural effusion. HEART AND MEDIASTINUM: Decreasing pneumomediastinum. No acute abnormality of the cardiac and mediastinal silhouettes. BONES AND SOFT TISSUES: Extensive subcutaneous emphysema along right chest wall. Surgical clips in left axilla. No acute osseous abnormality. IMPRESSION: 1. Persistent trace right apical pneumothorax. 2. Extensive subcutaneous emphysema along right chest wall. Electronically signed  by: Waddell Calk MD 03/14/2024 05:12 AM EST RP Workstation: [id number]   DG Chest Port 1 View Result Date: 03/13/2024 EXAM: 1 VIEW(S) XRAY OF THE CHEST 03/13/2024 03:43:00 AM COMPARISON: 03/12/2024 CLINICAL HISTORY: Pneumothorax, right. FINDINGS: LINES, TUBES AND DEVICES: Right Port-A-Cath stable in place. Right pigtail chest tube stable in place. LUNGS AND PLEURA: Trace right apical pneumothorax, similar. No focal pulmonary opacity. No pleural effusion. HEART AND MEDIASTINUM: Pneumomediastinum again noted but improved. BONES AND SOFT TISSUES: Left axillary clips noted. Extensive subcutaneous emphysema along the right chest wall. No acute osseous abnormality. IMPRESSION: 1. Trace right apical pneumothorax, similar to prior study. 2. Pneumomediastinum, improved. 3. Extensive right chest wall subcutaneous emphysema, unchanged. Electronically signed by: Oneil Devonshire MD 03/13/2024 03:47 AM EST RP Workstation: [id number]   DG Chest Portable 1 View Result Date: 03/12/2024 EXAM: 1 VIEW(S) XRAY OF THE CHEST 03/12/2024 10:42:00 AM COMPARISON: 03/12/2024 CLINICAL HISTORY: post chest tube insertion FINDINGS: LINES, TUBES AND DEVICES: Right chest port with catheter tip at superior cavoatrial junction. Interval placement of right apical chest tube. LUNGS AND PLEURA: No focal pulmonary opacity. No pleural effusion. Trace right apical pneumothorax. HEART AND MEDIASTINUM: Persistent pneumomediastinum. No acute abnormality of the cardiac silhouette. BONES AND SOFT TISSUES: Left axillary surgical clips. Extensive chest wall subcutaneous emphysema. No acute osseous abnormality. IMPRESSION: 1. Trace right apical pneumothorax. 2. Persistent pneumomediastinum. 3. Extensive chest wall subcutaneous emphysema. 4. Interval placement of right apical chest tube. Electronically signed by: Katheleen Faes MD 03/12/2024 11:42 AM EST RP Workstation: [id number]   CT Chest Wo Contrast Result Date: 03/12/2024 EXAM: CT CHEST WITHOUT CONTRAST  03/12/2024 09:31:00 AM TECHNIQUE:  CT of the chest was performed without the administration of intravenous contrast. Multiplanar reformatted images are provided for review. Automated exposure control, iterative reconstruction, and/or weight based adjustment of the mA/kV was utilized to reduce the radiation dose to as low as reasonably achievable. COMPARISON: None available. CLINICAL HISTORY: Pneumothorax suspected; poss PTX s/p port placement. FINDINGS: MEDIASTINUM: Heart and pericardium are unremarkable. Extensive pneumomediastinum is noted. The central airways are clear. Port-a-cath tip is at the cavo-atrial junction. LYMPH NODES: No mediastinal, hilar or axillary lymphadenopathy. LUNGS AND PLEURA: No focal consolidation or pulmonary edema. Large right apical and basilar pneumothorax is noted. Small right pleural effusion is noted. SOFT TISSUES/BONES: Extensive pneumomediastinum extends into supraclavicular and cervical soft tissues as well as the right lateral chest wall. A Port-a-cath is noted in the right upper chest. No acute abnormality of the bones. UPPER ABDOMEN: Limited images of the upper abdomen demonstrates no acute abnormality. IMPRESSION: 1. Large right apical and basilar pneumothorax. This finding was discussed with doctor Garrick at 11:16 am on 03/12/2024. 2. Extensive pneumomediastinum extending into supraclavicular and cervical soft tissues as well as right lateral chest wall. 3. Small right pleural effusion. Electronically signed by: Lynwood Seip MD 03/12/2024 11:16 AM EST RP Workstation: [id number]   DG Chest 2 View Result Date: 03/12/2024 EXAM: 2 VIEW(S) XRAY OF THE CHEST 03/12/2024 08:43:00 AM COMPARISON: 03/11/2024 CLINICAL HISTORY: post port placement with CP FINDINGS: LINES, TUBES AND DEVICES: Right chest wall port in place with tip overlying the expected region of the distal SVC. LUNGS AND PLEURA: No focal pulmonary opacity. Small right apical pneumothorax. No pleural effusion. HEART  AND MEDIASTINUM: Interval development of pneumomediastinum. BONES AND SOFT TISSUES: No acute osseous abnormality. Left chest wall surgical clips noted. Moderate right greater than left chest wall subcutaneous emphysema. Subcutaneous emphysema is also noted within bilateral lower neck regions. IMPRESSION: 1. Status post right sided port-a-cath placement with tip projecting over the distal SVC. 2.  Interval development small right apical pneumothorax and pneumomediastinum. 3. Moderate right greater than left chest wall subcutaneous emphysema, extending into the bilateral lower neck regions. 4. Critical results were called to the ordering provider at the time of interpretation who acknowledged these findings on 03/12/2024 at 9:03 am? I personally spoke with Dr. garrick . Electronically signed by: Waddell Calk MD 03/12/2024 09:03 AM EST RP Workstation: [id number]    Anti-infectives: Anti-infectives (From admission, onward)    None       Assessment/Plan: Iatrogenic PTX s/p port -cxr better still with some small apical ptx but no air leak symptoms better, will try to put to water seal today and repeat cxr in six hours -needs to be oob and ambulate today -wean oxygen to off -if lung stays up repeat xray in am and possibly remove tube and home tomorrow   Amber Ross 03/14/2024

## 2024-03-15 ENCOUNTER — Inpatient Hospital Stay (HOSPITAL_COMMUNITY)

## 2024-03-15 MED ORDER — ACETAMINOPHEN 500 MG PO TABS: 500.0000 mg | ORAL_TABLET | Freq: Four times a day (QID) | ORAL | Status: AC | PRN

## 2024-03-15 NOTE — Discharge Instructions (Signed)
 Change dry dressing where chest tube was previously in place. Change twice daily or when soiled.

## 2024-03-15 NOTE — Plan of Care (Incomplete)
  Problem: Clinical Measurements: Goal: Diagnostic test results will improve Outcome: Progressing   Problem: Education: Goal: Knowledge of General Education information will improve Description: Including pain rating scale, medication(s)/side effects and non-pharmacologic comfort measures Outcome: Adequate for Discharge   Problem: Health Behavior/Discharge Planning: Goal: Ability to manage health-related needs will improve Outcome: Adequate for Discharge   Problem: Clinical Measurements: Goal: Ability to maintain clinical measurements within normal limits will improve Outcome: Adequate for Discharge Goal: Will remain free from infection Outcome: Adequate for Discharge Goal: Respiratory complications will improve Outcome: Adequate for Discharge Goal: Cardiovascular complication will be avoided Outcome: Adequate for Discharge   Problem: Activity: Goal: Risk for activity intolerance will decrease Outcome: Adequate for Discharge   Problem: Nutrition: Goal: Adequate nutrition will be maintained Outcome: Adequate for Discharge   Problem: Coping: Goal: Level of anxiety will decrease Outcome: Adequate for Discharge   Problem: Elimination: Goal: Will not experience complications related to bowel motility Outcome: Adequate for Discharge Goal: Will not experience complications related to urinary retention Outcome: Adequate for Discharge   Problem: Pain Managment: Goal: General experience of comfort will improve and/or be controlled Outcome: Adequate for Discharge   Problem: Safety: Goal: Ability to remain free from injury will improve Outcome: Adequate for Discharge   Problem: Skin Integrity: Goal: Risk for impaired skin integrity will decrease Outcome: Adequate for Discharge

## 2024-03-15 NOTE — Progress Notes (Addendum)
 Pharmacist Chemotherapy Monitoring - Initial Assessment    Anticipated start date: 03/24/24   The following has been reviewed per standard work regarding the patient's treatment regimen: The patient's diagnosis, treatment plan and drug doses, and organ/hematologic function Lab orders and baseline tests specific to treatment regimen  The treatment plan start date, drug sequencing, and pre-medications Prior authorization status  Patient's documented medication list, including drug-drug interaction screen and prescriptions for anti-emetics and supportive care specific to the treatment regimen The drug concentrations, fluid compatibility, administration routes, and timing of the medications to be used The patient's access for treatment and lifetime cumulative dose history, if applicable  The patient's medication allergies and previous infusion related reactions, if applicable   Changes made to treatment plan:  N/A  Follow up needed:  Pending authorization for treatment  and access as pt admitted for iatrogenic PTX s/p port (port removed)    Amber Ross, PharmD, MBA

## 2024-03-15 NOTE — Progress Notes (Signed)
 Progress Note     Subjective: Patient states that she has some pain but only when she coughs. Tolerating regular diet without nausea/vomiting. Denies SOB, CP. Has not had BM.  ROS  All negative with the exception of above.  Objective: Vital signs in last 24 hours: Temp:  [98.3 F (36.8 C)-98.8 F (37.1 C)] 98.3 F (36.8 C) (11/24 0444) Pulse Rate:  [81-89] 81 (11/24 0444) Resp:  [18] 18 (11/24 0444) BP: (119-123)/(82-87) 119/82 (11/24 0444) SpO2:  [94 %-98 %] 94 % (11/24 0444) Last BM Date : 03/10/24  Intake/Output from previous day: 11/23 0701 - 11/24 0700 In: 360 [P.O.:360] Out: 994 [Urine:950; Chest Tube:44] Intake/Output this shift: Total I/O In: -  Out: 15 [Chest Tube:15]  PE: General: Pleasant female who is laying in bed in NAD. HEENT: Head is normocephalic, atraumatic.   Heart: HR normal during encounter. Palpable radial and pedal pulses bilaterally. Lungs: Respiratory effort nonlabored on room air. Incision site of left breast without concerns of infection. R chest tube in place with minimal to no additional output from previous recording.  Abd: Soft, NT, ND. No rebound tenderness or guarding.  MS: Able to move all 4 extremities.  Skin: Warm and dry.  Neuro: Cranial nerves 2-12 grossly intact, sensation is normal throughout Psych: A&Ox3 with an appropriate affect.    Lab Results:  Recent Labs    03/13/24 0425  WBC 8.2  HGB 13.2  HCT 41.8  PLT 131*   BMET Recent Labs    03/13/24 0425  NA 139  K 3.8  CL 102  CO2 29  GLUCOSE 117*  BUN 12  CREATININE 0.62  CALCIUM  9.4   PT/INR No results for input(s): LABPROT, INR in the last 72 hours. CMP     Component Value Date/Time   NA 139 03/13/2024 0425   NA 139 08/08/2020 1257   K 3.8 03/13/2024 0425   CL 102 03/13/2024 0425   CO2 29 03/13/2024 0425   GLUCOSE 117 (H) 03/13/2024 0425   BUN 12 03/13/2024 0425   BUN 15 08/08/2020 1257   CREATININE 0.62 03/13/2024 0425   CREATININE 0.86  01/21/2024 1247   CALCIUM  9.4 03/13/2024 0425   PROT 7.6 01/21/2024 1247   ALBUMIN  4.6 01/21/2024 1247   AST 10 (L) 01/21/2024 1247   ALT 16 01/21/2024 1247   ALKPHOS 64 01/21/2024 1247   BILITOT 0.8 01/21/2024 1247   GFRNONAA >60 03/13/2024 0425   GFRNONAA >60 01/21/2024 1247   Lipase  No results found for: LIPASE     Studies/Results: DG CHEST PORT 1 VIEW Result Date: 03/15/2024 CLINICAL DATA:  Pneumothorax. EXAM: PORTABLE CHEST 1 VIEW COMPARISON:  03/14/2024 FINDINGS: Right chest tube again noted without definite right-sided pneumothorax. Right Port-A-Cath tip overlies the proximal SVC level. Lungs are clear. The cardiopericardial silhouette is within normal limits for size. Subcutaneous emphysema in the supraclavicular regions right chest wall is similar to prior. IMPRESSION: Right chest tube without definite right-sided pneumothorax. Electronically Signed   By: Camellia Candle M.D.   On: 03/15/2024 06:13   DG CHEST PORT 1 VIEW Result Date: 03/14/2024 CLINICAL DATA:  Pneumothorax. EXAM: PORTABLE CHEST 1 VIEW COMPARISON:  Chest radiograph dated 03/14/2024. FINDINGS: Support apparatus in similar position. Minimal right apical pneumothorax remains. No focal consolidation, or effusion. Stable cardiac silhouette. No acute osseous pathology. Chest wall emphysema, slightly decreased since the prior radiograph. IMPRESSION: Minimal right apical pneumothorax remains. Electronically Signed   By: Vanetta Chou M.D.   On: 03/14/2024  14:03   DG CHEST PORT 1 VIEW Result Date: 03/14/2024 EXAM: 1 VIEW(S) XRAY OF THE CHEST 03/14/2024 04:10:00 AM COMPARISON: 03/13/2024 CLINICAL HISTORY: Pneumothorax FINDINGS: LINES, TUBES AND DEVICES: Right central venous catheter in place with tip in superior cavoatrial junction. Right pigtail chest tube stable in position. LUNGS AND PLEURA: Persistent trace right apical pneumothorax. No focal pulmonary opacity. No pleural effusion. HEART AND MEDIASTINUM: Decreasing  pneumomediastinum. No acute abnormality of the cardiac and mediastinal silhouettes. BONES AND SOFT TISSUES: Extensive subcutaneous emphysema along right chest wall. Surgical clips in left axilla. No acute osseous abnormality. IMPRESSION: 1. Persistent trace right apical pneumothorax. 2. Extensive subcutaneous emphysema along right chest wall. Electronically signed by: Waddell Calk MD 03/14/2024 05:12 AM EST RP Workstation: [id number]    Anti-infectives: Anti-infectives (From admission, onward)    None        Assessment/Plan Iatrogenic PTX s/p port -Afebrile. Vitals stable. -Chest xray from today 11/24 without definite right-sided pneumothorax. Xray reviewed with attending.  -Patient having no SOB on exam. Breathing on room air during encounter. Output of chest tube noted to be 44 mL. Chest tube removed during encounter with assistance of nurse. Patient tolerated well. No complications. -Will repeat chest xray in 4 hours.  -Mobilize as tolerated.   FEN: Regular VTE: Enoxaparin  ID: None    LOS: 3 days   I reviewed physician notes, nursing notes, last 24 h vitals and pain scores, last 48 h intake and output, last 24 h labs and trends, and last 24 h imaging results.   Marjorie Carlyon Favre, [facility] 03/15/2024, 9:00 AM Please see Amion for pager number during day hours 7:00am-4:30pm

## 2024-03-15 NOTE — Discharge Summary (Signed)
 [facility] Discharge Summary   Patient ID: Amber Ross MRN: [id number] DOB/AGE: Sep 22, 1955 68 y.o.  Admit date: 03/12/2024 Discharge date: 03/15/2024  Admitting Diagnosis: Postprocedural pneumothorax  Discharge Diagnosis Patient Active Problem List   Diagnosis Date Noted   Pneumothorax, right 03/12/2024   Cancer of left female breast (HCC) 02/16/2024   Genetic testing 01/29/2024   Family history of breast cancer    Malignant neoplasm of upper-inner quadrant of left breast in female, estrogen receptor positive (HCC) 01/19/2024   Aortic atherosclerosis    PFO (patent foramen ovale)    Agatston coronary artery calcium  score less than 100    Postoperative breast asymmetry 06/05/2018   History of breast cancer in female 08/10/2015   Upper airway cough syndrome 11/17/2013   Consultants None  Imaging: Chest xray 2 view - 03/12/2024 CT Chest WO Contrast - 03/12/2024 Chest xray 1 view - 03/12/2024 Chest xray 1 view - 03/13/2024 Chest xray 1 view - 03/14/2024 Chest xray 1 view - 03/14/2024 Chest xray 1 view - 03/15/2024 Chest xray 1 view - 03/15/2024  Procedures Burnard Banter, PA-C (03/12/24) - Right chest tube insertion  Hospital Course:  Amber Ross is a 68 year old female who presented to the ED after placement of her chemotherapy with neck swelling, upper chest swelling, and pain. Workup showed concern for pneumothorax. Patient had a right chest tube placed. Patient was admitted. Serial xrays were performed. Chest tube was removed on 03/15/2024. Follow up chest xray after removal of chest tube was reviewed with attending without concern of pneumothorax. The patient was voiding well, tolerating diet, ambulating well, pain well controlled, vital signs stable, incisions c/d/i and felt stable for discharge home. Confirmed with Dr. Curvin that patient does not need follow up xray outpatient. Patient will follow up in our office in 2 weeks with Dr. Curvin. She will call to  confirm appointment date/time.    Physical Exam: General: Pleasant female who is sitting in chair at bedside in NAD. HEENT: Head is normocephalic, atraumatic.   Heart: HR normal during encounter. Palpable radial and pedal pulses bilaterally. Lungs: Respiratory effort nonlabored on room air. Incision site of left breast without concerns of infection. R chest tube site C/D/I. Dressing inplace over site.  Abd: Soft, NT, ND. No rebound tenderness or guarding.  MS: Able to move all 4 extremities.  Skin: Warm and dry.   I or a member of my team have reviewed this patient in the Controlled Substance Database.   Allergies as of 03/15/2024   No Known Allergies      Medication List     STOP taking these medications    ondansetron  8 MG tablet Commonly known as: Zofran        TAKE these medications    acetaminophen  500 MG tablet Commonly known as: TYLENOL  Take 1 tablet (500 mg total) by mouth every 6 (six) hours as needed for headache, fever or moderate pain (pain score 4-6).   amphetamine -dextroamphetamine  10 MG tablet Commonly known as: ADDERALL Take 10-20 mg by mouth See admin instructions. Take 20 mg by mouth in the morning and 10 mg in the evening   atorvastatin  40 MG tablet Commonly known as: LIPITOR Take 1 tablet (40 mg total) by mouth daily.   cyanocobalamin 1000 MCG tablet Commonly known as: VITAMIN B12 Take 1,000 mcg by mouth daily.   dexamethasone  4 MG tablet Commonly known as: DECADRON  Take 1 tablet day after chemo and 1 tablet 2 days after chemo with food  diazepam  2 MG tablet Commonly known as: Valium  Take 1 tablet (2 mg total) by mouth every 12 (twelve) hours as needed for muscle spasms.   HAIR/SKIN/NAILS PO Take 2 Pieces by mouth See admin instructions. Gummies for hair/skin/mails - Chew 2 gummies by mouth once a day   lidocaine -prilocaine  cream Commonly known as: EMLA  Apply to affected area once   lisinopril -hydrochlorothiazide  20-12.5 MG  tablet Commonly known as: ZESTORETIC  Take 1 tablet by mouth daily.   omeprazole 20 MG capsule Commonly known as: PRILOSEC Take 20 mg by mouth daily before breakfast.   ondansetron  4 MG disintegrating tablet Commonly known as: ZOFRAN -ODT Take 1 tablet (4 mg total) by mouth every 8 (eight) hours as needed for nausea or vomiting. What changed: reasons to take this   oxyCODONE  5 MG immediate release tablet Commonly known as: Roxicodone  Take 1 tablet (5 mg total) by mouth every 6 (six) hours as needed. What changed: reasons to take this   prochlorperazine  10 MG tablet Commonly known as: COMPAZINE  Take 1 tablet (10 mg total) by mouth every 6 (six) hours as needed for nausea or vomiting.   traMADol  50 MG tablet Commonly known as: ULTRAM  Take 50 mg by mouth 2 (two) times daily as needed (for coughing).   triamcinolone cream 0.1 % Commonly known as: KENALOG Apply 1 Application topically 2 (two) times daily as needed (for itching).   venlafaxine  XR 150 MG 24 hr capsule Commonly known as: EFFEXOR -XR Take 150 mg by mouth daily with breakfast.   Vitamin D3 125 MCG (5000 UT) Caps Take 5,000 Units by mouth daily.          Follow-up Information     Curvin Deward MOULD, MD. Go on 03/30/2024.   Specialty: General Surgery Why: 10:40AM, Please arrive 15 minutes prior to your appointment time. Contact information: [address] [place] [address] [phone]                 Signed: Marjorie Carlyon Edmundo DEVONNA [facility] 03/15/2024, 3:51 PM Please see Amion for pager number during day hours 7:00am-4:30pm

## 2024-03-16 ENCOUNTER — Telehealth: Payer: Self-pay | Admitting: *Deleted

## 2024-03-16 NOTE — Telephone Encounter (Signed)
 Received call from pt requesting advice from MD if okay to proceed with chemo next week after recent hospitalization.  Per MD okay to proceed, pt educated and verbalized understanding.

## 2024-03-22 NOTE — Progress Notes (Signed)
 Fulphila orders changed to Udenyca per insurance preferred G-CSF.  Bridgett Leach [place], [place], BCPS, BCOP 03/22/2024 2:41 PM

## 2024-03-23 MED FILL — Fosaprepitant Dimeglumine For IV Infusion 150 MG (Base Eq): INTRAVENOUS | Qty: 5 | Status: AC

## 2024-03-24 ENCOUNTER — Inpatient Hospital Stay

## 2024-03-24 ENCOUNTER — Inpatient Hospital Stay: Attending: Hematology and Oncology

## 2024-03-24 ENCOUNTER — Inpatient Hospital Stay: Attending: Hematology and Oncology | Admitting: Hematology and Oncology

## 2024-03-24 VITALS — BP 133/79 | HR 78 | Temp 98.0°F | Resp 18

## 2024-03-24 VITALS — BP 133/72 | HR 85 | Temp 98.3°F | Resp 18 | Ht 67.0 in | Wt 161.8 lb

## 2024-03-24 DIAGNOSIS — C50212 Malignant neoplasm of upper-inner quadrant of left female breast: Secondary | ICD-10-CM

## 2024-03-24 DIAGNOSIS — R000 Tachycardia, unspecified: Secondary | ICD-10-CM | POA: Diagnosis not present

## 2024-03-24 DIAGNOSIS — Z5189 Encounter for other specified aftercare: Secondary | ICD-10-CM | POA: Diagnosis not present

## 2024-03-24 DIAGNOSIS — Z5111 Encounter for antineoplastic chemotherapy: Secondary | ICD-10-CM | POA: Insufficient documentation

## 2024-03-24 DIAGNOSIS — Z170 Estrogen receptor positive status [ER+]: Secondary | ICD-10-CM | POA: Insufficient documentation

## 2024-03-24 DIAGNOSIS — L309 Dermatitis, unspecified: Secondary | ICD-10-CM | POA: Diagnosis not present

## 2024-03-24 LAB — CBC WITH DIFFERENTIAL (CANCER CENTER ONLY)
Abs Immature Granulocytes: 0 K/uL (ref 0.00–0.07)
Basophils Absolute: 0 K/uL (ref 0.0–0.1)
Basophils Relative: 0 %
Eosinophils Absolute: 0 K/uL (ref 0.0–0.5)
Eosinophils Relative: 0 %
HCT: 36.8 % (ref 36.0–46.0)
Hemoglobin: 12.1 g/dL (ref 12.0–15.0)
Immature Granulocytes: 0 %
Lymphocytes Relative: 30 %
Lymphs Abs: 2.1 K/uL (ref 0.7–4.0)
MCH: 28.1 pg (ref 26.0–34.0)
MCHC: 32.9 g/dL (ref 30.0–36.0)
MCV: 85.6 fL (ref 80.0–100.0)
Monocytes Absolute: 0.5 K/uL (ref 0.1–1.0)
Monocytes Relative: 6 %
Neutro Abs: 4.6 K/uL (ref 1.7–7.7)
Neutrophils Relative %: 64 %
Platelet Count: 177 K/uL (ref 150–400)
RBC: 4.3 MIL/uL (ref 3.87–5.11)
RDW: 12.3 % (ref 11.5–15.5)
WBC Count: 7.2 K/uL (ref 4.0–10.5)
nRBC: 0 % (ref 0.0–0.2)

## 2024-03-24 LAB — CMP (CANCER CENTER ONLY)
ALT: 24 U/L (ref 0–44)
AST: 13 U/L — ABNORMAL LOW (ref 15–41)
Albumin: 4.2 g/dL (ref 3.5–5.0)
Alkaline Phosphatase: 76 U/L (ref 38–126)
Anion gap: 9 (ref 5–15)
BUN: 14 mg/dL (ref 8–23)
CO2: 28 mmol/L (ref 22–32)
Calcium: 9.6 mg/dL (ref 8.9–10.3)
Chloride: 101 mmol/L (ref 98–111)
Creatinine: 0.76 mg/dL (ref 0.44–1.00)
GFR, Estimated: >60 mL/min (ref >60)
Glucose, Bld: 147 mg/dL — ABNORMAL HIGH (ref 70–99)
Potassium: 3.8 mmol/L (ref 3.5–5.1)
Sodium: 138 mmol/L (ref 135–145)
Total Bilirubin: 0.6 mg/dL (ref 0.0–1.2)
Total Protein: 6.8 g/dL (ref 6.5–8.1)

## 2024-03-24 MED ORDER — DOXORUBICIN HCL CHEMO IV INJECTION 2 MG/ML
60.0000 mg/m2 | Freq: Once | INTRAVENOUS | Status: AC
  Administered 2024-03-24: 112 mg via INTRAVENOUS
  Filled 2024-03-24: qty 56

## 2024-03-24 MED ORDER — PALONOSETRON HCL INJECTION 0.25 MG/5ML
0.2500 mg | Freq: Once | INTRAVENOUS | Status: AC
  Administered 2024-03-24: 0.25 mg via INTRAVENOUS
  Filled 2024-03-24: qty 5

## 2024-03-24 MED ORDER — SODIUM CHLORIDE 0.9 % IV SOLN
600.0000 mg/m2 | Freq: Once | INTRAVENOUS | Status: AC
  Administered 2024-03-24: 1120 mg via INTRAVENOUS
  Filled 2024-03-24: qty 56

## 2024-03-24 MED ORDER — SODIUM CHLORIDE 0.9% FLUSH: 10.0000 mL | INTRAVENOUS | Status: DC | PRN

## 2024-03-24 MED ORDER — DEXAMETHASONE SOD PHOSPHATE PF 10 MG/ML IJ SOLN
10.0000 mg | Freq: Once | INTRAMUSCULAR | Status: AC
  Administered 2024-03-24: 10 mg via INTRAVENOUS

## 2024-03-24 MED ORDER — SODIUM CHLORIDE 0.9 % IV SOLN
150.0000 mg | Freq: Once | INTRAVENOUS | Status: AC
  Administered 2024-03-24: 150 mg via INTRAVENOUS
  Filled 2024-03-24: qty 150

## 2024-03-24 MED ORDER — SODIUM CHLORIDE 0.9 % IV SOLN: INTRAVENOUS | Status: DC

## 2024-03-24 NOTE — Assessment & Plan Note (Signed)
 01/13/2024:History of left breast cancer: 2001: Lumpectomy and radiation Screening mammogram detected left breast mass by ultrasound 2.2 cm at 9 o'clock position, axilla negative biopsy: Grade 3 IDC ER 85%, PR 0%, HER2 2+ by IHC, FISH negative ratio 1.60, Ki-67 95%  02/16/2024: Left mastectomy: Grade 3 IDC 3.6 cm, margins negative, 0/9 lymph nodes, repeat prognostic panel: ER 0%, PR 0%, Ki67 90%, HER2 2+ by IHC, FISH negative Recommendations: 1.  Adjuvant chemotherapy with Adriamycin Cytoxan dose dense x 4 followed by Taxol weekly x 12  2.  With mastectomy and prior radiation, no role of additional adjuvant radiation.  With estrogen receptor being negative there is no role of antiestrogen therapy -------------------------------------------------------------------------------------------------------------------------------------------------- Current treatment: Cycle 1 dose dense Adriamycin and Cytoxan Hospitalization 03/12/2024-03/15/2024: Post port placement pneumothorax requiring chest tube Echocardiogram 03/03/2024: EF 55 to 60% Return to clinic in 1 week for toxicity check

## 2024-03-24 NOTE — Patient Instructions (Signed)
 [facility] - A DEPT OF [facility]  Discharge Instructions: Thank you for choosing [facility] to provide your oncology and hematology care.   If you have a lab appointment with the Cancer Center, please go directly to the Cancer Center and check in at the registration area.   Wear comfortable clothing and clothing appropriate for easy access to any Portacath or PICC line.   We strive to give you quality time with your provider. You may need to reschedule your appointment if you arrive late (15 or more minutes).  Arriving late affects you and other patients whose appointments are after yours.  Also, if you miss three or more appointments without notifying the office, you may be dismissed from the clinic at the provider's discretion.      For prescription refill requests, have your pharmacy contact our office and allow 72 hours for refills to be completed.    Today you received the following chemotherapy and/or immunotherapy agents doxorubicin and cytoxan        To help prevent nausea and vomiting after your treatment, we encourage you to take your nausea medication as directed.  BELOW ARE SYMPTOMS THAT SHOULD BE REPORTED IMMEDIATELY: *FEVER GREATER THAN 100.4 F (38 C) OR HIGHER *CHILLS OR SWEATING *NAUSEA AND VOMITING THAT IS NOT CONTROLLED WITH YOUR NAUSEA MEDICATION *UNUSUAL SHORTNESS OF BREATH *UNUSUAL BRUISING OR BLEEDING *URINARY PROBLEMS (pain or burning when urinating, or frequent urination) *BOWEL PROBLEMS (unusual diarrhea, constipation, pain near the anus) TENDERNESS IN MOUTH AND THROAT WITH OR WITHOUT PRESENCE OF ULCERS (sore throat, sores in mouth, or a toothache) UNUSUAL RASH, SWELLING OR PAIN  UNUSUAL VAGINAL DISCHARGE OR ITCHING   Items with * indicate a potential emergency and should be followed up as soon as possible or go to the Emergency Department if any problems should occur.  Please show the CHEMOTHERAPY ALERT CARD or  IMMUNOTHERAPY ALERT CARD at check-in to the Emergency Department and triage nurse.  Should you have questions after your visit or need to cancel or reschedule your appointment, please contact [facility] - A DEPT OF [facility]  Dept: [phone]  and follow the prompts.  Office hours are 8:00 a.m. to 4:30 p.m. Monday - Friday. Please note that voicemails left after 4:00 p.m. may not be returned until the following business day.  We are closed weekends and major holidays. You have access to a nurse at all times for urgent questions. Please call the main number to the clinic Dept: [phone] and follow the prompts.   For any non-urgent questions, you may also contact your provider using MyChart. We now offer e-Visits for anyone 54 and older to request care online for non-urgent symptoms. For details visit [email or web address].   Also download the MyChart app! Go to the app store, search "MyChart", open the app, select [place], and log in with your MyChart username and password.

## 2024-03-24 NOTE — Progress Notes (Signed)
 Patient Care Team: Loreli Kins, MD as PCP - General (Family Medicine) Shlomo Wilbert SAUNDERS, MD as PCP - Cardiology (Cardiology) Gerome, Devere HERO, RN as Oncology Nurse Navigator Tyree Nanetta SAILOR, RN as Registered Nurse Curvin Deward MOULD, MD as Consulting Physician (General Surgery) Odean Potts, MD as Consulting Physician (Hematology and Oncology) Izell Domino, MD as Attending Physician (Radiation Oncology)  DIAGNOSIS:  Encounter Diagnosis  Name Primary?   Malignant neoplasm of upper-inner quadrant of left breast in female, estrogen receptor positive (HCC) Yes    SUMMARY OF ONCOLOGIC HISTORY: Oncology History  Malignant neoplasm of upper-inner quadrant of left breast in female, estrogen receptor positive (HCC)  01/13/2024 Initial Diagnosis   History of left breast cancer: 2001: Lumpectomy and radiation Screening mammogram detected left breast mass by ultrasound 2.2 cm at 9 o'clock position, axilla negative biopsy: Grade 3 IDC ER 85%, PR 0%, HER2 equivalent FISH pending, Ki-67 95%   01/21/2024 Cancer Staging   Staging form: Breast, AJCC 8th Edition - Clinical: Stage IIB (cT2, cN0, cM0, G3, ER+, PR-, HER2-) - Signed by Odean Potts, MD on 01/21/2024 Stage prefix: Initial diagnosis Histologic grading system: 3 grade system   01/27/2024 Genetic Testing   Negative genetic testing The report date is January 27, 2024.  The Multi-Cancer + RNA Panel offered by [facility] includes sequencing and/or deletion/duplication analysis of the following 70 genes:  AIP*, ALK, APC*, ATM*, AXIN2*, BAP1*, BARD1*, BLM*, BMPR1A*, BRCA1*, BRCA2*, BRIP1*, CDC73*, CDH1*, CDK4, CDKN1B*, CDKN2A, CHEK2*, CTNNA1*, DICER1*, EPCAM (del/dup only), EGFR, FH*, FLCN*, GREM1 (promoter dup only), HOXB13, KIT, LZTR1, MAX*, MBD4, MEN1*, MET, MITF, MLH1*, MSH2*, MSH3*, MSH6*, MUTYH*, NF1*, NF2*, NTHL1*, PALB2*, PDGFRA, PMS2*, POLD1*, POLE*, POT1*, PRKAR1A*, PTCH1*, PTEN*, RAD51C*, RAD51D*, RB1*, RET, SDHA* (sequencing only),  SDHAF2*, SDHB*, SDHC*, SDHD*, SMAD4*, SMARCA4*, SMARCB1*, SMARCE1*, STK11*, SUFU*, TMEM127*, TP53*, TSC1*, TSC2*, VHL*. RNA analysis is performed for * genes.    03/24/2024 -  Chemotherapy   Patient is on Treatment Plan : BREAST DOSE DENSE AC q14d / PACLitaxel q7d       CHIEF COMPLIANT: Cycle 1 dose dense Adriamycin and Cytoxan  HISTORY OF PRESENT ILLNESS:  History of Present Illness Amber Ross is a 68 year old female who presents for her first chemotherapy session with cycle 1 dose dense Adriamycin and Cytoxan.  She is anxious to get started with her chemotherapy.  She brings her current medications, including a steroid pill to be taken over the next two days and an IV antiemetic similar to Zofran  to be given today, which has a 3 day effect. She was counseled on potential drowsiness and constipation.  She recently had an echocardiogram.  She was hospitalized for 4 days for an air leak with facial and neck swelling for a pneumothorax, treated with chest tube placement.     ALLERGIES:  has no known allergies.  MEDICATIONS:  Current Outpatient Medications  Medication Sig Dispense Refill   acetaminophen  (TYLENOL ) 500 MG tablet Take 1 tablet (500 mg total) by mouth every 6 (six) hours as needed for headache, fever or moderate pain (pain score 4-6).     amphetamine -dextroamphetamine  (ADDERALL) 10 MG tablet Take 10-20 mg by mouth See admin instructions. Take 20 mg by mouth in the morning and 10 mg in the evening     atorvastatin  (LIPITOR) 40 MG tablet Take 1 tablet (40 mg total) by mouth daily. 30 tablet 0   Biotin w/ Vitamins C & E (HAIR/SKIN/NAILS PO) Take 2 Pieces by mouth See admin instructions. Gummies for hair/skin/mails -  Chew 2 gummies by mouth once a day     Cholecalciferol (VITAMIN D3) 125 MCG (5000 UT) CAPS Take 5,000 Units by mouth daily.     dexamethasone  (DECADRON ) 4 MG tablet Take 1 tablet day after chemo and 1 tablet 2 days after chemo with food (Patient not  taking: Reported on 03/12/2024) 8 tablet 0   diazepam  (VALIUM ) 2 MG tablet Take 1 tablet (2 mg total) by mouth every 12 (twelve) hours as needed for muscle spasms. 20 tablet 0   lidocaine -prilocaine  (EMLA ) cream Apply to affected area once (Patient not taking: Reported on 03/12/2024) 30 g 3   lisinopril -hydrochlorothiazide  (ZESTORETIC ) 20-12.5 MG tablet Take 1 tablet by mouth daily.     omeprazole (PRILOSEC) 20 MG capsule Take 20 mg by mouth daily before breakfast.     ondansetron  (ZOFRAN -ODT) 4 MG disintegrating tablet Take 1 tablet (4 mg total) by mouth every 8 (eight) hours as needed for nausea or vomiting. (Patient taking differently: Take 4 mg by mouth every 8 (eight) hours as needed for nausea or vomiting (dissolve orally).) 20 tablet 0   oxyCODONE  (ROXICODONE ) 5 MG immediate release tablet Take 1 tablet (5 mg total) by mouth every 6 (six) hours as needed. (Patient taking differently: Take 5 mg by mouth every 6 (six) hours as needed (for pain).) 5 tablet 0   prochlorperazine  (COMPAZINE ) 10 MG tablet Take 1 tablet (10 mg total) by mouth every 6 (six) hours as needed for nausea or vomiting. 30 tablet 1   traMADol  (ULTRAM ) 50 MG tablet Take 50 mg by mouth 2 (two) times daily as needed (for coughing).     triamcinolone cream (KENALOG) 0.1 % Apply 1 Application topically 2 (two) times daily as needed (for itching).     venlafaxine  XR (EFFEXOR -XR) 150 MG 24 hr capsule Take 150 mg by mouth daily with breakfast.     vitamin B-12 (CYANOCOBALAMIN) 1000 MCG tablet Take 1,000 mcg by mouth daily.     No current facility-administered medications for this visit.    PHYSICAL EXAMINATION: ECOG PERFORMANCE STATUS: 1 - Symptomatic but completely ambulatory  Vitals:   03/24/24 1342  BP: 133/72  Pulse: 85  Resp: 18  Temp: 98.3 F (36.8 C)  SpO2: 100%   Filed Weights   03/24/24 1342  Weight: 161 lb 12.8 oz (73.4 kg)      LABORATORY DATA:  I have reviewed the data as listed    Latest Ref Rng &  Units 03/13/2024    4:25 AM 03/11/2024    7:51 AM 01/21/2024   12:47 PM  CMP  Glucose 70 - 99 mg/dL 882  96  98   BUN 8 - 23 mg/dL 12  17  13    Creatinine 0.44 - 1.00 mg/dL 9.37  9.32  9.13   Sodium 135 - 145 mmol/L 139  141  138   Potassium 3.5 - 5.1 mmol/L 3.8  3.6  3.9   Chloride 98 - 111 mmol/L 102  101  101   CO2 22 - 32 mmol/L 29  23  32   Calcium  8.9 - 10.3 mg/dL 9.4  9.5  89.8   Total Protein 6.5 - 8.1 g/dL   7.6   Total Bilirubin 0.0 - 1.2 mg/dL   0.8   Alkaline Phos 38 - 126 U/L   64   AST 15 - 41 U/L   10   ALT 0 - 44 U/L   16     Lab Results  Component Value Date  WBC 7.2 03/24/2024   HGB 12.1 03/24/2024   HCT 36.8 03/24/2024   MCV 85.6 03/24/2024   PLT 177 03/24/2024   NEUTROABS 4.6 03/24/2024    ASSESSMENT & PLAN:  Malignant neoplasm of upper-inner quadrant of left breast in female, estrogen receptor positive (HCC) 01/13/2024:History of left breast cancer: 2001: Lumpectomy and radiation Screening mammogram detected left breast mass by ultrasound 2.2 cm at 9 o'clock position, axilla negative biopsy: Grade 3 IDC ER 85%, PR 0%, HER2 2+ by IHC, FISH negative ratio 1.60, Ki-67 95%  02/16/2024: Left mastectomy: Grade 3 IDC 3.6 cm, margins negative, 0/9 lymph nodes, repeat prognostic panel: ER 0%, PR 0%, Ki67 90%, HER2 2+ by IHC, FISH negative Recommendations: 1.  Adjuvant chemotherapy with Adriamycin Cytoxan dose dense x 4 followed by Taxol weekly x 12  2.  With mastectomy and prior radiation, no role of additional adjuvant radiation.  With estrogen receptor being negative there is no role of antiestrogen therapy -------------------------------------------------------------------------------------------------------------------------------------------------- Current treatment: Cycle 1 dose dense Adriamycin and Cytoxan Hospitalization 03/12/2024-03/15/2024: Post port placement pneumothorax requiring chest tube Echocardiogram 03/03/2024: EF 55 to 60% Pneumothorax status  post chest tube placement and removal after port placement.  Return to clinic in 1 week for toxicity check  No orders of the defined types were placed in this encounter.  The patient has a good understanding of the overall plan. she agrees with it. she will call with any problems that may develop before the next visit here.  I personally spent a total of 30 minutes in the care of the patient today including preparing to see the patient, getting/reviewing separately obtained history, performing a medically appropriate exam/evaluation, counseling and educating, placing orders, referring and communicating with other health care professionals, documenting clinical information in the EHR, independently interpreting results, communicating results, and coordinating care.   Viinay K Marque Bango, MD 03/24/24

## 2024-03-25 ENCOUNTER — Telehealth: Payer: Self-pay

## 2024-03-25 NOTE — Telephone Encounter (Signed)
-----   Message from Nurse Lavanda RAMAN sent at 03/24/2024  4:18 PM EST ----- Regarding: First time doxorubicin and cytoxan dr odean pt First time doxorubicin and cytoxan  Dr. Odean pt  Tolerated well

## 2024-03-25 NOTE — Telephone Encounter (Signed)
 LM for patient that this nurse was calling to see how they were doing after their treatment. Please call back to Dr. Earmon Phoenix nurse at [phone] if they have any questions or concerns regarding the treatment.

## 2024-03-26 ENCOUNTER — Inpatient Hospital Stay

## 2024-03-26 VITALS — BP 133/74 | HR 79 | Temp 97.8°F | Resp 17

## 2024-03-26 DIAGNOSIS — Z5111 Encounter for antineoplastic chemotherapy: Secondary | ICD-10-CM | POA: Diagnosis not present

## 2024-03-26 DIAGNOSIS — C50212 Malignant neoplasm of upper-inner quadrant of left female breast: Secondary | ICD-10-CM

## 2024-03-26 MED ORDER — PEGFILGRASTIM-CBQV 6 MG/0.6ML ~~LOC~~ SOSY
6.0000 mg | PREFILLED_SYRINGE | Freq: Once | SUBCUTANEOUS | Status: AC
  Administered 2024-03-26: 6 mg via SUBCUTANEOUS
  Filled 2024-03-26: qty 0.6

## 2024-03-29 ENCOUNTER — Encounter: Payer: Self-pay | Admitting: Hematology and Oncology

## 2024-03-30 ENCOUNTER — Other Ambulatory Visit: Payer: Self-pay | Admitting: Pharmacist

## 2024-03-30 MED ORDER — PEGFILGRASTIM-CBQV 6 MG/0.6ML ~~LOC~~ SOSY: 6.0000 mg | PREFILLED_SYRINGE | Freq: Once | SUBCUTANEOUS | 2 refills | Status: AC

## 2024-03-30 NOTE — Progress Notes (Signed)
 Per Roselinda at [employer], patient must do home administration of Udenyca  starting with cycle 2. Prescription for Udenyca  6 mg SubQ sent to [facility] as directed. Dr. Gudena, Kelsey Desota and Arland Idol aware.  Treatment plan updated removing injection day (Day 3) from treatment plan.  Lynnetta Tom A. Lucila, PharmD, BCOP, CPP 03/30/2024 3:27 PM

## 2024-03-31 ENCOUNTER — Encounter: Payer: Self-pay | Admitting: *Deleted

## 2024-03-31 ENCOUNTER — Encounter: Payer: Self-pay | Admitting: Hematology and Oncology

## 2024-03-31 NOTE — Progress Notes (Signed)
 Received message from PA team stating our facility is not authorized for Udenyca  injections and that pt will have to administer Udenyca  at home.  RN reviewed with pharmacy team who helped send prescription for Udenyca  to [facility] mail order [facility].  Pt due for injections on 04/09/24, 04/23/24 and 05/07/24. RN also faxed home heath nursing orders to [facility] team at [phone].  Pt notified and verbalized understanding.  RN also educated pt to contact our office if she does not hear from [facility]. If there are any issues, pt RN case manager with [employer] can be reached at [phone] ext [phone].

## 2024-04-05 ENCOUNTER — Telehealth: Payer: Self-pay

## 2024-04-05 ENCOUNTER — Other Ambulatory Visit (HOSPITAL_COMMUNITY): Payer: Self-pay

## 2024-04-05 ENCOUNTER — Encounter: Payer: Self-pay | Admitting: Hematology and Oncology

## 2024-04-05 NOTE — Telephone Encounter (Signed)
 Oral Oncology Patient Advocate Encounter   Received notification that prior authorization for pegfilgrastim -cbqv (UDENYCA ) 6 MG/0.6ML injection   is required.   PA submitted on 04/05/2024 Key [id number] Status is pending      Charlott Hamilton,  CPhT-Adv  she/her/hers [facility]  [facility] Pharmacy Technician Patient Advocate Specialist III [facility] Phone: [phone]  Fax: [phone] [email or web address]

## 2024-04-06 ENCOUNTER — Other Ambulatory Visit (HOSPITAL_COMMUNITY): Payer: Self-pay

## 2024-04-06 ENCOUNTER — Telehealth: Payer: Self-pay

## 2024-04-06 NOTE — Telephone Encounter (Signed)
 Oral Oncology Patient Advocate Encounter  Prior Authorization for  pegfilgrastim -cbqv (UDENYCA ) 6 MG/0.6ML injection  has been approved.    PA# [id number] Effective dates: 04/05/2024 through 07/04/2024  Patient has been notified via MyChart   Charlott Hamilton,  CPhT-Adv  she/her/hers [facility]  [facility] Pharmacy Technician Patient Advocate Specialist III [facility] Phone: [phone]  Fax: [phone] [email or web address]

## 2024-04-06 NOTE — Telephone Encounter (Signed)
 Patient was contacted to inform them of approval for prior authorization (PA) for Amber Ross. Patient verbalized awareness and acknowledged understanding that the injection will be administered at home, consistent with prior discussions with the care team.

## 2024-04-07 ENCOUNTER — Other Ambulatory Visit: Payer: Self-pay

## 2024-04-07 ENCOUNTER — Telehealth: Payer: Self-pay | Admitting: *Deleted

## 2024-04-07 ENCOUNTER — Inpatient Hospital Stay

## 2024-04-07 ENCOUNTER — Inpatient Hospital Stay: Admitting: Hematology and Oncology

## 2024-04-07 VITALS — BP 122/93 | HR 94 | Temp 98.3°F | Resp 18

## 2024-04-07 VITALS — BP 126/81 | HR 98 | Resp 18 | Ht 67.0 in | Wt 158.0 lb

## 2024-04-07 DIAGNOSIS — C50212 Malignant neoplasm of upper-inner quadrant of left female breast: Secondary | ICD-10-CM | POA: Diagnosis not present

## 2024-04-07 DIAGNOSIS — Z5111 Encounter for antineoplastic chemotherapy: Secondary | ICD-10-CM | POA: Diagnosis not present

## 2024-04-07 DIAGNOSIS — Z170 Estrogen receptor positive status [ER+]: Secondary | ICD-10-CM | POA: Diagnosis not present

## 2024-04-07 LAB — CMP (CANCER CENTER ONLY)
ALT: 23 U/L (ref 0–44)
AST: 14 U/L — ABNORMAL LOW (ref 15–41)
Albumin: 4.3 g/dL (ref 3.5–5.0)
Alkaline Phosphatase: 82 U/L (ref 38–126)
Anion gap: 9 (ref 5–15)
BUN: 10 mg/dL (ref 8–23)
CO2: 29 mmol/L (ref 22–32)
Calcium: 9.4 mg/dL (ref 8.9–10.3)
Chloride: 101 mmol/L (ref 98–111)
Creatinine: 0.72 mg/dL (ref 0.44–1.00)
GFR, Estimated: >60 mL/min (ref >60)
Glucose, Bld: 117 mg/dL — ABNORMAL HIGH (ref 70–99)
Potassium: 3.4 mmol/L — ABNORMAL LOW (ref 3.5–5.1)
Sodium: 139 mmol/L (ref 135–145)
Total Bilirubin: 0.4 mg/dL (ref 0.0–1.2)
Total Protein: 6.9 g/dL (ref 6.5–8.1)

## 2024-04-07 LAB — CBC WITH DIFFERENTIAL (CANCER CENTER ONLY)
Abs Immature Granulocytes: 0.71 K/uL — ABNORMAL HIGH (ref 0.00–0.07)
Basophils Absolute: 0.1 K/uL (ref 0.0–0.1)
Basophils Relative: 1 %
Eosinophils Absolute: 0 K/uL (ref 0.0–0.5)
Eosinophils Relative: 0 %
HCT: 35.1 % — ABNORMAL LOW (ref 36.0–46.0)
Hemoglobin: 12 g/dL (ref 12.0–15.0)
Immature Granulocytes: 7 %
Lymphocytes Relative: 18 %
Lymphs Abs: 1.7 K/uL (ref 0.7–4.0)
MCH: 28.6 pg (ref 26.0–34.0)
MCHC: 34.2 g/dL (ref 30.0–36.0)
MCV: 83.6 fL (ref 80.0–100.0)
Monocytes Absolute: 1.1 K/uL — ABNORMAL HIGH (ref 0.1–1.0)
Monocytes Relative: 11 %
Neutro Abs: 6.3 K/uL (ref 1.7–7.7)
Neutrophils Relative %: 63 %
Platelet Count: 118 K/uL — ABNORMAL LOW (ref 150–400)
RBC: 4.2 MIL/uL (ref 3.87–5.11)
RDW: 12.3 % (ref 11.5–15.5)
WBC Count: 9.9 K/uL (ref 4.0–10.5)
nRBC: 0.2 % (ref 0.0–0.2)

## 2024-04-07 MED ORDER — DEXAMETHASONE SOD PHOSPHATE PF 10 MG/ML IJ SOLN
10.0000 mg | Freq: Once | INTRAMUSCULAR | Status: AC
  Administered 2024-04-07: 15:00:00 10 mg via INTRAVENOUS

## 2024-04-07 MED ORDER — SODIUM CHLORIDE 0.9 % IV SOLN: INTRAVENOUS | Status: DC

## 2024-04-07 MED ORDER — DOXORUBICIN HCL CHEMO IV INJECTION 2 MG/ML
60.0000 mg/m2 | Freq: Once | INTRAVENOUS | Status: AC
  Administered 2024-04-07: 16:00:00 112 mg via INTRAVENOUS
  Filled 2024-04-07: qty 56

## 2024-04-07 MED ORDER — PALONOSETRON HCL INJECTION 0.25 MG/5ML
0.2500 mg | Freq: Once | INTRAVENOUS | Status: AC
  Administered 2024-04-07: 15:00:00 0.25 mg via INTRAVENOUS
  Filled 2024-04-07: qty 5

## 2024-04-07 MED ORDER — SODIUM CHLORIDE 0.9 % IV SOLN
600.0000 mg/m2 | Freq: Once | INTRAVENOUS | Status: AC
  Administered 2024-04-07: 16:00:00 1120 mg via INTRAVENOUS
  Filled 2024-04-07: qty 56

## 2024-04-07 MED ORDER — SODIUM CHLORIDE 0.9 % IV SOLN
150.0000 mg | Freq: Once | INTRAVENOUS | Status: AC
  Administered 2024-04-07: 15:00:00 150 mg via INTRAVENOUS
  Filled 2024-04-07: qty 150

## 2024-04-07 MED ORDER — SODIUM CHLORIDE 0.9% FLUSH: 10.0000 mL | INTRAVENOUS | Status: DC | PRN

## 2024-04-07 NOTE — Assessment & Plan Note (Signed)
°  01/13/2024:History of left breast cancer: 2001: Lumpectomy and radiation Screening mammogram detected left breast mass by ultrasound 2.2 cm at 9 o'clock position, axilla negative biopsy: Grade 3 IDC ER 85%, PR 0%, HER2 2+ by IHC, FISH negative ratio 1.60, Ki-67 95%   02/16/2024: Left mastectomy: Grade 3 IDC 3.6 cm, margins negative, 0/9 lymph nodes, repeat prognostic panel: ER 0%, PR 0%, Ki67 90%, HER2 2+ by IHC, FISH negative Recommendations: 1.  Adjuvant chemotherapy with Adriamycin  Cytoxan  dose dense x 4 followed by Taxol weekly x 12  2.  With mastectomy and prior radiation, no role of additional adjuvant radiation.  With estrogen receptor being negative there is no role of antiestrogen therapy -------------------------------------------------------------------------------------------------------------------------------------------------- Current treatment: Cycle 2 dose dense Adriamycin  and Cytoxan  Hospitalization 03/12/2024-03/15/2024: Post port placement pneumothorax requiring chest tube Echocardiogram 03/03/2024: EF 55 to 60% Pneumothorax status post chest tube placement and removal after port placement.  Chemotoxicities:   Return to clinic in 2 weeks for cycle 3

## 2024-04-07 NOTE — Progress Notes (Signed)
 Patient Care Team: Loreli Kins, MD as PCP - General (Family Medicine) Shlomo Wilbert SAUNDERS, MD as PCP - Cardiology (Cardiology) Gerome, Devere HERO, RN as Oncology Nurse Navigator Tyree Nanetta SAILOR, RN as Registered Nurse Curvin Deward MOULD, MD as Consulting Physician (General Surgery) Odean Potts, MD as Consulting Physician (Hematology and Oncology) Izell Domino, MD as Attending Physician (Radiation Oncology)  DIAGNOSIS:  Encounter Diagnosis  Name Primary?   Malignant neoplasm of upper-inner quadrant of left breast in female, estrogen receptor positive (HCC) Yes    SUMMARY OF ONCOLOGIC HISTORY: Oncology History  Malignant neoplasm of upper-inner quadrant of left breast in female, estrogen receptor positive (HCC)  01/13/2024 Initial Diagnosis   History of left breast cancer: 2001: Lumpectomy and radiation Screening mammogram detected left breast mass by ultrasound 2.2 cm at 9 o'clock position, axilla negative biopsy: Grade 3 IDC ER 85%, PR 0%, HER2 equivalent FISH pending, Ki-67 95%   01/21/2024 Cancer Staging   Staging form: Breast, AJCC 8th Edition - Clinical: Stage IIB (cT2, cN0, cM0, G3, ER+, PR-, HER2-) - Signed by Odean Potts, MD on 01/21/2024 Stage prefix: Initial diagnosis Histologic grading system: 3 grade system   01/27/2024 Genetic Testing   Negative genetic testing The report date is January 27, 2024.  The Multi-Cancer + RNA Panel offered by [facility] includes sequencing and/or deletion/duplication analysis of the following 70 genes:  AIP*, ALK, APC*, ATM*, AXIN2*, BAP1*, BARD1*, BLM*, BMPR1A*, BRCA1*, BRCA2*, BRIP1*, CDC73*, CDH1*, CDK4, CDKN1B*, CDKN2A, CHEK2*, CTNNA1*, DICER1*, EPCAM (del/dup only), EGFR, FH*, FLCN*, GREM1 (promoter dup only), HOXB13, KIT, LZTR1, MAX*, MBD4, MEN1*, MET, MITF, MLH1*, MSH2*, MSH3*, MSH6*, MUTYH*, NF1*, NF2*, NTHL1*, PALB2*, PDGFRA, PMS2*, POLD1*, POLE*, POT1*, PRKAR1A*, PTCH1*, PTEN*, RAD51C*, RAD51D*, RB1*, RET, SDHA* (sequencing only),  SDHAF2*, SDHB*, SDHC*, SDHD*, SMAD4*, SMARCA4*, SMARCB1*, SMARCE1*, STK11*, SUFU*, TMEM127*, TP53*, TSC1*, TSC2*, VHL*. RNA analysis is performed for * genes.    03/24/2024 -  Chemotherapy   Patient is on Treatment Plan : BREAST DOSE DENSE AC q14d / PACLitaxel q7d       CHIEF COMPLIANT: Cycle 2 dose dense Adriamycin  and Cytoxan   HISTORY OF PRESENT ILLNESS:  History of Present Illness Amber Ross is a 68 year old female with Stage IIB ER-negative, HER2-negative invasive ductal carcinoma of the left breast, status post mastectomy, currently receiving adjuvant dose-dense Adriamycin  and Cytoxan , who presents for routine oncology follow-up and chemotherapy infusion.  She completed her most recent cycle of adjuvant chemotherapy without significant toxicity. She has no nausea, vomiting, oral mucositis, bowel changes, dysgeusia, or other new symptoms. She is aware of and accepts the expected alopecia.  Laboratory studies today show leukocyte, hemoglobin, and platelet counts within acceptable ranges for treatment.  She is experiencing significant frustration and stress related to insurance authorization delays for her required injection at home. She has not received the injection due to these issues despite multiple attempts to resolve them and is worried about the impact on her care.  She prefers continuity in nursing care during infusions and requested to have the same nurse when possible, citing prior supportive care during chemotherapy. She was reassured about nursing support.      ALLERGIES:  has no known allergies.  MEDICATIONS:  Current Outpatient Medications  Medication Sig Dispense Refill   acetaminophen  (TYLENOL ) 500 MG tablet Take 1 tablet (500 mg total) by mouth every 6 (six) hours as needed for headache, fever or moderate pain (pain score 4-6).     amphetamine -dextroamphetamine  (ADDERALL) 10 MG tablet Take 10-20 mg by mouth  See admin instructions. Take 20 mg by mouth in  the morning and 10 mg in the evening     atorvastatin  (LIPITOR) 40 MG tablet Take 1 tablet (40 mg total) by mouth daily. 30 tablet 0   Biotin w/ Vitamins C & E (HAIR/SKIN/NAILS PO) Take 2 Pieces by mouth See admin instructions. Gummies for hair/skin/mails - Chew 2 gummies by mouth once a day     Cholecalciferol (VITAMIN D3) 125 MCG (5000 UT) CAPS Take 5,000 Units by mouth daily.     dexamethasone  (DECADRON ) 4 MG tablet Take 1 tablet day after chemo and 1 tablet 2 days after chemo with food (Patient not taking: Reported on 03/12/2024) 8 tablet 0   diazepam  (VALIUM ) 2 MG tablet Take 1 tablet (2 mg total) by mouth every 12 (twelve) hours as needed for muscle spasms. 20 tablet 0   lidocaine -prilocaine  (EMLA ) cream Apply to affected area once (Patient not taking: Reported on 03/12/2024) 30 g 3   lisinopril -hydrochlorothiazide  (ZESTORETIC ) 20-12.5 MG tablet Take 1 tablet by mouth daily.     omeprazole (PRILOSEC) 20 MG capsule Take 20 mg by mouth daily before breakfast.     ondansetron  (ZOFRAN -ODT) 4 MG disintegrating tablet Take 1 tablet (4 mg total) by mouth every 8 (eight) hours as needed for nausea or vomiting. (Patient taking differently: Take 4 mg by mouth every 8 (eight) hours as needed for nausea or vomiting (dissolve orally).) 20 tablet 0   oxyCODONE  (ROXICODONE ) 5 MG immediate release tablet Take 1 tablet (5 mg total) by mouth every 6 (six) hours as needed. (Patient taking differently: Take 5 mg by mouth every 6 (six) hours as needed (for pain).) 5 tablet 0   prochlorperazine  (COMPAZINE ) 10 MG tablet Take 1 tablet (10 mg total) by mouth every 6 (six) hours as needed for nausea or vomiting. 30 tablet 1   traMADol  (ULTRAM ) 50 MG tablet Take 50 mg by mouth 2 (two) times daily as needed (for coughing).     triamcinolone cream (KENALOG) 0.1 % Apply 1 Application topically 2 (two) times daily as needed (for itching).     venlafaxine  XR (EFFEXOR -XR) 150 MG 24 hr capsule Take 150 mg by mouth daily with  breakfast.     vitamin B-12 (CYANOCOBALAMIN) 1000 MCG tablet Take 1,000 mcg by mouth daily.     No current facility-administered medications for this visit.    PHYSICAL EXAMINATION: ECOG PERFORMANCE STATUS: 1 - Symptomatic but completely ambulatory  Vitals:   04/07/24 1356  BP: 126/81  Pulse: 98  Resp: 18  SpO2: 98%   Filed Weights   04/07/24 1356  Weight: 158 lb (71.7 kg)     LABORATORY DATA:  I have reviewed the data as listed    Latest Ref Rng & Units 03/24/2024    1:15 PM 03/13/2024    4:25 AM 03/11/2024    7:51 AM  CMP  Glucose 70 - 99 mg/dL 852  882  96   BUN 8 - 23 mg/dL 14  12  17    Creatinine 0.44 - 1.00 mg/dL 9.23  9.37  9.32   Sodium 135 - 145 mmol/L 138  139  141   Potassium 3.5 - 5.1 mmol/L 3.8  3.8  3.6   Chloride 98 - 111 mmol/L 101  102  101   CO2 22 - 32 mmol/L 28  29  23    Calcium  8.9 - 10.3 mg/dL 9.6  9.4  9.5   Total Protein 6.5 - 8.1 g/dL 6.8  Total Bilirubin 0.0 - 1.2 mg/dL 0.6     Alkaline Phos 38 - 126 U/L 76     AST 15 - 41 U/L 13     ALT 0 - 44 U/L 24       Lab Results  Component Value Date   WBC 9.9 04/07/2024   HGB 12.0 04/07/2024   HCT 35.1 (L) 04/07/2024   MCV 83.6 04/07/2024   PLT 118 (L) 04/07/2024   NEUTROABS 6.3 04/07/2024    ASSESSMENT & PLAN:  Malignant neoplasm of upper-inner quadrant of left breast in female, estrogen receptor positive (HCC)  01/13/2024:History of left breast cancer: 2001: Lumpectomy and radiation Screening mammogram detected left breast mass by ultrasound 2.2 cm at 9 o'clock position, axilla negative biopsy: Grade 3 IDC ER 85%, PR 0%, HER2 2+ by IHC, FISH negative ratio 1.60, Ki-67 95%   02/16/2024: Left mastectomy: Grade 3 IDC 3.6 cm, margins negative, 0/9 lymph nodes, repeat prognostic panel: ER 0%, PR 0%, Ki67 90%, HER2 2+ by IHC, FISH negative Recommendations: 1.  Adjuvant chemotherapy with Adriamycin  Cytoxan  dose dense x 4 followed by Taxol weekly x 12  2.  With mastectomy and prior  radiation, no role of additional adjuvant radiation.  With estrogen receptor being negative there is no role of antiestrogen therapy -------------------------------------------------------------------------------------------------------------------------------------------------- Current treatment: Cycle 2 dose dense Adriamycin  and Cytoxan  Hospitalization 03/12/2024-03/15/2024: Post port placement pneumothorax requiring chest tube Echocardiogram 03/03/2024: EF 55 to 60% Pneumothorax status post chest tube placement and removal after port placement.  Chemotoxicities: Fatigue that lasted for 3 to 4 days Denies any nausea vomiting or abdominal symptoms.  Patient's insurance is [employer] and it has been a challenge trying to get her Neulasta  injection approved to be given at the cancer center.  They are trying to get the injection to be delivered to the patient's home to be injected by home health nurse but nothing has happened and it is giving her a lot of stress as well.  Return to clinic in 2 weeks for cycle 3   No orders of the defined types were placed in this encounter.  The patient has a good understanding of the overall plan. she agrees with it. she will call with any problems that may develop before the next visit here.  I personally spent a total of 30 minutes in the care of the patient today including preparing to see the patient, getting/reviewing separately obtained history, performing a medically appropriate exam/evaluation, counseling and educating, placing orders, referring and communicating with other health care professionals, documenting clinical information in the EHR, independently interpreting results, communicating results, and coordinating care.   Viinay K Itai Barbian, MD 04/07/2024

## 2024-04-07 NOTE — Telephone Encounter (Signed)
 Pt in office today stating she has not heard from [facility], home health nursing, or [facility] pharmacy for home Udenyca  injection.  Pt states she is extremely frustrated with the situation because she has called several times with no return call from [employer] or [facility].  Pt is due for Udenyca  on 04/09/24. RN attempt x1 to contact pt Systems Analyst, [place] [phone] ext [phone]). No answer, LVM with above information and requested case manager return call to office.  RN also alerted publishing copy, pharmacy, and PA team of current situation.

## 2024-04-07 NOTE — Patient Instructions (Signed)
 [facility] - A DEPT OF [facility]  Discharge Instructions: Thank you for choosing [facility] to provide your oncology and hematology care.   If you have a lab appointment with the Cancer Center, please go directly to the Cancer Center and check in at the registration area.   Wear comfortable clothing and clothing appropriate for easy access to any Portacath or PICC line.   We strive to give you quality time with your provider. You may need to reschedule your appointment if you arrive late (15 or more minutes).  Arriving late affects you and other patients whose appointments are after yours.  Also, if you miss three or more appointments without notifying the office, you may be dismissed from the clinic at the provider's discretion.      For prescription refill requests, have your pharmacy contact our office and allow 72 hours for refills to be completed.    Today you received the following chemotherapy and/or immunotherapy agents doxorubicin and cytoxan        To help prevent nausea and vomiting after your treatment, we encourage you to take your nausea medication as directed.  BELOW ARE SYMPTOMS THAT SHOULD BE REPORTED IMMEDIATELY: *FEVER GREATER THAN 100.4 F (38 C) OR HIGHER *CHILLS OR SWEATING *NAUSEA AND VOMITING THAT IS NOT CONTROLLED WITH YOUR NAUSEA MEDICATION *UNUSUAL SHORTNESS OF BREATH *UNUSUAL BRUISING OR BLEEDING *URINARY PROBLEMS (pain or burning when urinating, or frequent urination) *BOWEL PROBLEMS (unusual diarrhea, constipation, pain near the anus) TENDERNESS IN MOUTH AND THROAT WITH OR WITHOUT PRESENCE OF ULCERS (sore throat, sores in mouth, or a toothache) UNUSUAL RASH, SWELLING OR PAIN  UNUSUAL VAGINAL DISCHARGE OR ITCHING   Items with * indicate a potential emergency and should be followed up as soon as possible or go to the Emergency Department if any problems should occur.  Please show the CHEMOTHERAPY ALERT CARD or  IMMUNOTHERAPY ALERT CARD at check-in to the Emergency Department and triage nurse.  Should you have questions after your visit or need to cancel or reschedule your appointment, please contact [facility] - A DEPT OF [facility]  Dept: [phone]  and follow the prompts.  Office hours are 8:00 a.m. to 4:30 p.m. Monday - Friday. Please note that voicemails left after 4:00 p.m. may not be returned until the following business day.  We are closed weekends and major holidays. You have access to a nurse at all times for urgent questions. Please call the main number to the clinic Dept: [phone] and follow the prompts.   For any non-urgent questions, you may also contact your provider using MyChart. We now offer e-Visits for anyone 54 and older to request care online for non-urgent symptoms. For details visit [email or web address].   Also download the MyChart app! Go to the app store, search "MyChart", open the app, select [place], and log in with your MyChart username and password.

## 2024-04-08 ENCOUNTER — Encounter: Payer: Self-pay | Admitting: *Deleted

## 2024-04-08 ENCOUNTER — Telehealth: Payer: Self-pay

## 2024-04-08 ENCOUNTER — Other Ambulatory Visit: Payer: Self-pay | Admitting: Pharmacist

## 2024-04-08 DIAGNOSIS — C50212 Malignant neoplasm of upper-inner quadrant of left female breast: Secondary | ICD-10-CM

## 2024-04-08 NOTE — Progress Notes (Signed)
 PA team able to get a one time approval for pt to receive Udenyca  here at our facility on 04/09/24. RN also re faxed Udenyca  orders and [facility] [employer] RN orders to [facility] pharmacy [phone].

## 2024-04-08 NOTE — Telephone Encounter (Signed)
 Pt called wanting to know when [employer] originally requested we send the prescription for her Udenyca  injection to [facility] mail order pharmacy. RN informed her that it was on 03/31/24. Pt will f/u with [employer] to determine next steps with arranging home health to administer injections.

## 2024-04-09 ENCOUNTER — Inpatient Hospital Stay

## 2024-04-09 ENCOUNTER — Telehealth: Payer: Self-pay

## 2024-04-09 VITALS — BP 137/79 | HR 96 | Temp 98.0°F | Resp 17

## 2024-04-09 DIAGNOSIS — C50212 Malignant neoplasm of upper-inner quadrant of left female breast: Secondary | ICD-10-CM

## 2024-04-09 DIAGNOSIS — Z5111 Encounter for antineoplastic chemotherapy: Secondary | ICD-10-CM | POA: Diagnosis not present

## 2024-04-09 MED ORDER — PEGFILGRASTIM-CBQV 6 MG/0.6ML ~~LOC~~ SOSY
6.0000 mg | PREFILLED_SYRINGE | Freq: Once | SUBCUTANEOUS | Status: AC
  Administered 2024-04-09: 6 mg via SUBCUTANEOUS
  Filled 2024-04-09: qty 0.6

## 2024-04-09 NOTE — Telephone Encounter (Signed)
 [id number]- Effectiveness of Out-of Pocket Cost Communication and Financial Navigation (CostCOM) in Cancer Patients:    Patient Amber Ross was identified by Dr. Gudena as a potential candidate for the above listed study.  This Clinical Research Coordinator spoke with [name], [id number], via phone in a manner that ensures patient privacy to discuss participation in the above listed research study.  Patient reads, speaks, and understands English.   Approximately 10 minutes were spent with the patient reviewing the informed consent documents.  Patient declined study participation. Dr. Odean notified.  Laury Quale, MPH  Clinical Research Coordinator

## 2024-04-13 ENCOUNTER — Telehealth: Payer: Self-pay | Admitting: *Deleted

## 2024-04-13 NOTE — Telephone Encounter (Signed)
 Received VM from [employer] Case Manager stating [facility] is needing a call from the office to verify Udenyca  injection.  RN reached out to [facility] at [phone].  Pharmacy states the home health team with their pharmacy is trying to reach out to pt and pt can contact them at [phone].  RN attempt x1 to contact pt with information.  No answer, left detailed [email or web address]

## 2024-04-16 ENCOUNTER — Encounter: Payer: Self-pay | Admitting: Hematology and Oncology

## 2024-04-16 ENCOUNTER — Other Ambulatory Visit (HOSPITAL_BASED_OUTPATIENT_CLINIC_OR_DEPARTMENT_OTHER): Payer: Self-pay

## 2024-04-16 ENCOUNTER — Other Ambulatory Visit: Payer: Self-pay | Admitting: *Deleted

## 2024-04-16 MED ORDER — LIDOCAINE VISCOUS HCL 2 % MT SOLN
Freq: Four times a day (QID) | OROMUCOSAL | 1 refills | Status: AC | PRN
  Filled 2024-04-16: qty 240, 14d supply, fill #0
  Filled 2024-05-07: qty 240, 6d supply, fill #1

## 2024-04-16 MED ORDER — MAGIC MOUTHWASH W/LIDOCAINE
5.0000 mL | Freq: Four times a day (QID) | ORAL | 1 refills | Status: DC | PRN
  Filled 2024-04-16: qty 240, 12d supply, fill #0

## 2024-04-16 NOTE — Progress Notes (Signed)
 Received call from pt with complaint of mouth sores and rash on her back.  MD out of office, RN reviewed with PA on call who recommends magic mouth wash for oral sores as well as apply benadryl  cream PRN for rash on back.  Pt is scheduled to see NP next week and will discuss in more detail during that visit. Pt educated and verbalized understanding.

## 2024-04-19 ENCOUNTER — Telehealth: Payer: Self-pay

## 2024-04-19 NOTE — Telephone Encounter (Signed)
 Received call from pt with hoarse voice, asking if this is from chemo.   Called pt, she denies post nasal drainage, chest congestion or cough, fever, but does report mouth sores which she is using magic mouthwash for; this is helping.   She was advised to bring hot tea with honey and lemon to help with her hoarse voice and to voice rest. She will come to [facility] tomorrow to see Morna Kendall, NP

## 2024-04-20 ENCOUNTER — Inpatient Hospital Stay

## 2024-04-20 ENCOUNTER — Inpatient Hospital Stay (HOSPITAL_BASED_OUTPATIENT_CLINIC_OR_DEPARTMENT_OTHER): Admitting: Adult Health

## 2024-04-20 ENCOUNTER — Inpatient Hospital Stay (HOSPITAL_BASED_OUTPATIENT_CLINIC_OR_DEPARTMENT_OTHER)

## 2024-04-20 ENCOUNTER — Other Ambulatory Visit: Payer: Self-pay | Admitting: Hematology and Oncology

## 2024-04-20 ENCOUNTER — Encounter: Payer: Self-pay | Admitting: Adult Health

## 2024-04-20 VITALS — BP 118/75 | HR 118 | Temp 98.0°F | Resp 18 | Wt 155.6 lb

## 2024-04-20 DIAGNOSIS — C50212 Malignant neoplasm of upper-inner quadrant of left female breast: Secondary | ICD-10-CM

## 2024-04-20 DIAGNOSIS — Z5111 Encounter for antineoplastic chemotherapy: Secondary | ICD-10-CM | POA: Diagnosis not present

## 2024-04-20 DIAGNOSIS — Z170 Estrogen receptor positive status [ER+]: Secondary | ICD-10-CM

## 2024-04-20 LAB — CMP (CANCER CENTER ONLY)
ALT: 31 U/L (ref 0–44)
AST: 16 U/L (ref 15–41)
Albumin: 4.2 g/dL (ref 3.5–5.0)
Alkaline Phosphatase: 88 U/L (ref 38–126)
Anion gap: 11 (ref 5–15)
BUN: 8 mg/dL (ref 8–23)
CO2: 29 mmol/L (ref 22–32)
Calcium: 10 mg/dL (ref 8.9–10.3)
Chloride: 97 mmol/L — ABNORMAL LOW (ref 98–111)
Creatinine: 0.77 mg/dL (ref 0.44–1.00)
GFR, Estimated: >60 mL/min
Glucose, Bld: 113 mg/dL — ABNORMAL HIGH (ref 70–99)
Potassium: 3.2 mmol/L — ABNORMAL LOW (ref 3.5–5.1)
Sodium: 137 mmol/L (ref 135–145)
Total Bilirubin: 0.6 mg/dL (ref 0.0–1.2)
Total Protein: 7.1 g/dL (ref 6.5–8.1)

## 2024-04-20 LAB — CBC WITH DIFFERENTIAL (CANCER CENTER ONLY)
Abs Immature Granulocytes: 0.66 K/uL — ABNORMAL HIGH (ref 0.00–0.07)
Basophils Absolute: 0.1 K/uL (ref 0.0–0.1)
Basophils Relative: 1 %
Eosinophils Absolute: 0 K/uL (ref 0.0–0.5)
Eosinophils Relative: 0 %
HCT: 32.6 % — ABNORMAL LOW (ref 36.0–46.0)
Hemoglobin: 11 g/dL — ABNORMAL LOW (ref 12.0–15.0)
Immature Granulocytes: 6 %
Lymphocytes Relative: 11 %
Lymphs Abs: 1.2 K/uL (ref 0.7–4.0)
MCH: 28 pg (ref 26.0–34.0)
MCHC: 33.7 g/dL (ref 30.0–36.0)
MCV: 83 fL (ref 80.0–100.0)
Monocytes Absolute: 1 K/uL (ref 0.1–1.0)
Monocytes Relative: 10 %
Neutro Abs: 7.7 K/uL (ref 1.7–7.7)
Neutrophils Relative %: 72 %
Platelet Count: 175 K/uL (ref 150–400)
RBC: 3.93 MIL/uL (ref 3.87–5.11)
RDW: 12.5 % (ref 11.5–15.5)
WBC Count: 10.6 K/uL — ABNORMAL HIGH (ref 4.0–10.5)
nRBC: 0.2 % (ref 0.0–0.2)

## 2024-04-20 MED ORDER — METHYLPREDNISOLONE 4 MG PO TBPK: ORAL_TABLET | ORAL | 0 refills | Status: DC

## 2024-04-20 NOTE — Progress Notes (Unsigned)
 [facility] Cancer Follow up:    Amber Kins, MD [address] [place] [address]   DIAGNOSIS: Cancer Staging  Malignant neoplasm of upper-inner quadrant of left breast in female, estrogen receptor positive (HCC) Staging form: Breast, AJCC 8th Edition - Clinical: Stage IIB (cT2, cN0, cM0, G3, ER+, PR-, HER2-) - Signed by Odean Potts, MD on 01/21/2024 Stage prefix: Initial diagnosis Histologic grading system: 3 grade system    SUMMARY OF ONCOLOGIC HISTORY: Oncology History  Malignant neoplasm of upper-inner quadrant of left breast in female, estrogen receptor positive (HCC)  01/13/2024 Initial Diagnosis   History of left breast cancer: 2001: Lumpectomy and radiation Screening mammogram detected left breast mass by ultrasound 2.2 cm at 9 o'clock position, axilla negative biopsy: Grade 3 IDC ER 85%, PR 0%, HER2 equivalent FISH pending, Ki-67 95%   01/21/2024 Cancer Staging   Staging form: Breast, AJCC 8th Edition - Clinical: Stage IIB (cT2, cN0, cM0, G3, ER+, PR-, HER2-) - Signed by Odean Potts, MD on 01/21/2024 Stage prefix: Initial diagnosis Histologic grading system: 3 grade system   01/27/2024 Genetic Testing   Negative genetic testing The report date is January 27, 2024.  The Multi-Cancer + RNA Panel offered by [facility] includes sequencing and/or deletion/duplication analysis of the following 70 genes:  AIP*, ALK, APC*, ATM*, AXIN2*, BAP1*, BARD1*, BLM*, BMPR1A*, BRCA1*, BRCA2*, BRIP1*, CDC73*, CDH1*, CDK4, CDKN1B*, CDKN2A, CHEK2*, CTNNA1*, DICER1*, EPCAM (del/dup only), EGFR, FH*, FLCN*, GREM1 (promoter dup only), HOXB13, KIT, LZTR1, MAX*, MBD4, MEN1*, MET, MITF, MLH1*, MSH2*, MSH3*, MSH6*, MUTYH*, NF1*, NF2*, NTHL1*, PALB2*, PDGFRA, PMS2*, POLD1*, POLE*, POT1*, PRKAR1A*, PTCH1*, PTEN*, RAD51C*, RAD51D*, RB1*, RET, SDHA* (sequencing only), SDHAF2*, SDHB*, SDHC*, SDHD*, SMAD4*, SMARCA4*, SMARCB1*, SMARCE1*, STK11*, SUFU*, TMEM127*, TP53*, TSC1*,  TSC2*, VHL*. RNA analysis is performed for * genes.    03/24/2024 -  Chemotherapy   Patient is on Treatment Plan : BREAST DOSE DENSE AC q14d / PACLitaxel q7d       CURRENT THERAPY:  INTERVAL HISTORY:  Discussed the use of AI scribe software for clinical note transcription with the patient, who gave verbal consent to proceed.  History of Present Illness      Patient Active Problem List   Diagnosis Date Noted   Pneumothorax, right 03/12/2024   Cancer of left female breast (HCC) 02/16/2024   Genetic testing 01/29/2024   Family history of breast cancer    Malignant neoplasm of upper-inner quadrant of left breast in female, estrogen receptor positive (HCC) 01/19/2024   Aortic atherosclerosis    PFO (patent foramen ovale)    Agatston coronary artery calcium  score less than 100    Postoperative breast asymmetry 06/05/2018   History of breast cancer in female 08/10/2015   Upper airway cough syndrome 11/17/2013    has no known allergies.  MEDICAL HISTORY: Past Medical History:  Diagnosis Date   ADHD    Agatston coronary artery calcium  score less than 100    coronary CTA showed minimal plaque in the pLAD 07/2020   Aortic atherosclerosis    Atrophic vaginitis    Breast cancer (HCC)    left breast cancer 20 yrs ago   Coronary artery disease    minimal CAD in LAD   Cough    COVID 05/17/2019   Elevated cholesterol    Elevated cholesterol    Estrogen deficiency    Family history of breast cancer    Gastric reflux    GERD (gastroesophageal reflux disease)    History of breast cancer  Hypertension    Osteopenia    Personal history of radiation therapy    PFO (patent foramen ovale)    small and noted on cardiac CTA   Psoriasis    Sleep apnea    Mild. No CPAP   SOB (shortness of breath)     SURGICAL HISTORY: Past Surgical History:  Procedure Laterality Date   BREAST BIOPSY Left 01/13/2024   US  LT BREAST BX W LOC DEV 1ST LESION IMG BX SPEC US  GUIDE 01/13/2024 [facility]  MAMMOGRAPHY   BREAST LUMPECTOMY Left 2001   BREAST REDUCTION SURGERY Right 09/28/2018   Procedure: MAMMARY REDUCTION  (BREAST);  Surgeon: Lowery Estefana RAMAN, DO;  Location: [facility];  Service: Plastics;  Laterality: Right;   CESAREAN SECTION  1992   FACIAL COSMETIC SURGERY     MASTECTOMY W/ SENTINEL NODE BIOPSY Left 02/16/2024   Procedure: MASTECTOMY WITH SENTINEL LYMPH NODE BIOPSY;  Surgeon: Curvin Deward MOULD, MD;  Location: [facility] OR;  Service: General;  Laterality: Left;  GEN w/PEC BLOCK LEFT MASTECTOMY SENTINEL NODE BIOPSY   MASTOPEXY Left 09/28/2018   Procedure: MASTOPEXY;  Surgeon: Lowery Estefana RAMAN, DO;  Location: [facility];  Service: Plastics;  Laterality: Left;   PORTACATH PLACEMENT N/A 03/11/2024   Procedure: INSERTION, TUNNELED CENTRAL VENOUS DEVICE, WITH PORT and ASPIRATION OF SEROMA;  Surgeon: Curvin Deward MOULD, MD;  Location: [facility] OR;  Service: General;  Laterality: N/A;  PORT PLACEMENT WITH ULTRASOUND GUIDANCE   TUBAL LIGATION      SOCIAL HISTORY: Social History   Socioeconomic History   Marital status: Divorced    Spouse name: Not on file   Number of children: Not on file   Years of education: Not on file   Highest education level: Not on file  Occupational History   Occupation: Mortgage loan processor   Tobacco Use   Smoking status: Former    Current packs/day: 0.00    Average packs/day: 0.3 packs/day for 15.0 years (3.8 ttl pk-yrs)    Types: Cigarettes    Start date: 04/22/1974    Quit date: 04/22/1989    Years since quitting: 35.0   Smokeless tobacco: Never  Vaping Use   Vaping status: Never Used  Substance and Sexual Activity   Alcohol use: Yes    Comment: socially   Drug use: No   Sexual activity: Not Currently    Birth control/protection: Post-menopausal  Other Topics Concern   Not on file  Social History Narrative   Not on file   Social Drivers of Health   Tobacco Use: Medium Risk (04/20/2024)   Patient History     Smoking Tobacco Use: Former    Smokeless Tobacco Use: Never    Passive Exposure: Not on Actuary Strain: Not on file  Food Insecurity: No Food Insecurity (04/20/2024)   Epic    Worried About Programme Researcher, Broadcasting/film/video in the Last Year: Never true    Ran Out of Food in the Last Year: Never true  Transportation Needs: No Transportation Needs (04/20/2024)   Epic    Lack of Transportation (Medical): No    Lack of Transportation (Non-Medical): No  Physical Activity: Not on file  Stress: Not on file  Social Connections: Unknown (03/12/2024)   Social Connection and Isolation Panel    Frequency of Communication with Friends and Family: Three times a week    Frequency of Social Gatherings with Friends and Family: Twice a week    Attends Religious Services: Patient declined  Active Member of Clubs or Organizations: Patient declined    Attends Banker Meetings: Patient declined    Marital Status: Patient declined  Intimate Partner Violence: Not At Risk (04/20/2024)   Epic    Fear of Current or Ex-Partner: No    Emotionally Abused: No    Physically Abused: No    Sexually Abused: No  Depression (PHQ2-9): Low Risk (04/20/2024)   Depression (PHQ2-9)    PHQ-2 Score: 0  Alcohol Screen: Not on file  Housing: Unknown (04/20/2024)   Epic    Unable to Pay for Housing in the Last Year: No    Number of Times Moved in the Last Year: Not on file    Homeless in the Last Year: No  Utilities: Not At Risk (04/20/2024)   Epic    Threatened with loss of utilities: No  Health Literacy: Not on file    FAMILY HISTORY: Family History  Problem Relation Age of Onset   Dementia Mother    Fibroids Mother    Breast cancer Maternal Aunt    Lung cancer Maternal Aunt    Cancer Maternal Uncle        Nasal   Stroke Maternal Uncle    Breast cancer Half-Sister    Cancer Half-Sister        NOS   Cancer Half-Sister        NOS    Review of Systems - Oncology    PHYSICAL  EXAMINATION   Onc Performance Status - 04/20/24 1348       ECOG Perf Status   ECOG Perf Status Restricted in physically strenuous activity but ambulatory and able to carry out work of a light or sedentary nature, e.g., light house work, office work      KPS SCALE   KPS % SCORE Able to carry on normal activity, minor s/s of disease          Vitals:   04/20/24 1327  BP: 118/75  Pulse: (!) 118  Resp: 18  Temp: 98 F (36.7 C)  SpO2: 99%    Physical Exam  LABORATORY DATA:  CBC    Component Value Date/Time   WBC 10.6 (H) 04/20/2024 1252   WBC 8.2 03/13/2024 0425   RBC 3.93 04/20/2024 1252   HGB 11.0 (L) 04/20/2024 1252   HCT 32.6 (L) 04/20/2024 1252   PLT 175 04/20/2024 1252   MCV 83.0 04/20/2024 1252   MCV 83.0 08/10/2015 0935   MCH 28.0 04/20/2024 1252   MCHC 33.7 04/20/2024 1252   RDW 12.5 04/20/2024 1252   LYMPHSABS 1.2 04/20/2024 1252   MONOABS 1.0 04/20/2024 1252   EOSABS 0.0 04/20/2024 1252   BASOSABS 0.1 04/20/2024 1252    CMP     Component Value Date/Time   NA 137 04/20/2024 1252   NA 139 08/08/2020 1257   K 3.2 (L) 04/20/2024 1252   CL 97 (L) 04/20/2024 1252   CO2 29 04/20/2024 1252   GLUCOSE 113 (H) 04/20/2024 1252   BUN 8 04/20/2024 1252   BUN 15 08/08/2020 1257   CREATININE 0.77 04/20/2024 1252   CALCIUM  10.0 04/20/2024 1252   PROT 7.1 04/20/2024 1252   ALBUMIN  4.2 04/20/2024 1252   AST 16 04/20/2024 1252   ALT 31 04/20/2024 1252   ALKPHOS 88 04/20/2024 1252   BILITOT 0.6 04/20/2024 1252   GFRNONAA >60 04/20/2024 1252     ASSESSMENT and THERAPY PLAN:   No problem-specific Assessment & Plan notes found for this encounter.  Assessment and Plan Assessment & Plan       All questions were answered. The patient knows to call the clinic with any problems, questions or concerns. We can certainly see the patient much sooner if necessary.  Total encounter time:*** minutes*in face-to-face visit time, chart review, lab review, care  coordination, order entry, and documentation of the encounter time.    Morna Kendall, NP 04/20/2024 2:04 PM Medical Oncology and Hematology [facility] [address] [place], [place] [address] Tel. [phone]    Fax. [phone]  *Total Encounter Time as defined by the Centers for Medicare and Medicaid Services includes, in addition to the face-to-face time of a patient visit (documented in the note above) non-face-to-face time: obtaining and reviewing outside history, ordering and reviewing medications, tests or procedures, care coordination (communications with other health care professionals or caregivers) and documentation in the medical record.

## 2024-04-21 ENCOUNTER — Other Ambulatory Visit: Payer: Self-pay

## 2024-04-23 ENCOUNTER — Encounter: Payer: Self-pay | Admitting: Hematology and Oncology

## 2024-04-23 ENCOUNTER — Inpatient Hospital Stay

## 2024-04-23 ENCOUNTER — Other Ambulatory Visit: Payer: Self-pay

## 2024-04-25 ENCOUNTER — Other Ambulatory Visit: Payer: Self-pay

## 2024-04-27 ENCOUNTER — Encounter: Payer: Self-pay | Admitting: Hematology and Oncology

## 2024-04-27 ENCOUNTER — Inpatient Hospital Stay: Admitting: Adult Health

## 2024-04-27 ENCOUNTER — Encounter: Payer: Self-pay | Admitting: *Deleted

## 2024-04-27 ENCOUNTER — Inpatient Hospital Stay: Attending: Hematology and Oncology

## 2024-04-27 ENCOUNTER — Encounter: Payer: Self-pay | Admitting: Adult Health

## 2024-04-27 DIAGNOSIS — Z170 Estrogen receptor positive status [ER+]: Secondary | ICD-10-CM

## 2024-04-27 DIAGNOSIS — C50212 Malignant neoplasm of upper-inner quadrant of left female breast: Secondary | ICD-10-CM

## 2024-04-27 DIAGNOSIS — Z5111 Encounter for antineoplastic chemotherapy: Secondary | ICD-10-CM | POA: Diagnosis present

## 2024-04-27 DIAGNOSIS — Z171 Estrogen receptor negative status [ER-]: Secondary | ICD-10-CM | POA: Diagnosis not present

## 2024-04-27 DIAGNOSIS — K5900 Constipation, unspecified: Secondary | ICD-10-CM | POA: Diagnosis not present

## 2024-04-27 DIAGNOSIS — L270 Generalized skin eruption due to drugs and medicaments taken internally: Secondary | ICD-10-CM | POA: Insufficient documentation

## 2024-04-27 DIAGNOSIS — D701 Agranulocytosis secondary to cancer chemotherapy: Secondary | ICD-10-CM | POA: Insufficient documentation

## 2024-04-27 LAB — CBC WITH DIFFERENTIAL (CANCER CENTER ONLY)
Abs Immature Granulocytes: 0.16 K/uL — ABNORMAL HIGH (ref 0.00–0.07)
Basophils Absolute: 0.1 K/uL (ref 0.0–0.1)
Basophils Relative: 1 %
Eosinophils Absolute: 0 K/uL (ref 0.0–0.5)
Eosinophils Relative: 0 %
HCT: 36.7 % (ref 36.0–46.0)
Hemoglobin: 12.2 g/dL (ref 12.0–15.0)
Immature Granulocytes: 2 %
Lymphocytes Relative: 14 %
Lymphs Abs: 1.2 K/uL (ref 0.7–4.0)
MCH: 28.8 pg (ref 26.0–34.0)
MCHC: 33.2 g/dL (ref 30.0–36.0)
MCV: 86.6 fL (ref 80.0–100.0)
Monocytes Absolute: 1.1 K/uL — ABNORMAL HIGH (ref 0.1–1.0)
Monocytes Relative: 13 %
Neutro Abs: 6 K/uL (ref 1.7–7.7)
Neutrophils Relative %: 70 %
Platelet Count: 386 K/uL (ref 150–400)
RBC: 4.24 MIL/uL (ref 3.87–5.11)
RDW: 15.7 % — ABNORMAL HIGH (ref 11.5–15.5)
WBC Count: 8.5 K/uL (ref 4.0–10.5)
nRBC: 0 % (ref 0.0–0.2)

## 2024-04-27 LAB — CMP (CANCER CENTER ONLY)
ALT: 29 U/L (ref 0–44)
AST: 14 U/L — ABNORMAL LOW (ref 15–41)
Albumin: 4.1 g/dL (ref 3.5–5.0)
Alkaline Phosphatase: 71 U/L (ref 38–126)
Anion gap: 11 (ref 5–15)
BUN: 16 mg/dL (ref 8–23)
CO2: 28 mmol/L (ref 22–32)
Calcium: 9.5 mg/dL (ref 8.9–10.3)
Chloride: 101 mmol/L (ref 98–111)
Creatinine: 0.83 mg/dL (ref 0.44–1.00)
GFR, Estimated: >60 mL/min
Glucose, Bld: 124 mg/dL — ABNORMAL HIGH (ref 70–99)
Potassium: 3.5 mmol/L (ref 3.5–5.1)
Sodium: 141 mmol/L (ref 135–145)
Total Bilirubin: 0.4 mg/dL (ref 0.0–1.2)
Total Protein: 6.8 g/dL (ref 6.5–8.1)

## 2024-04-27 MED ORDER — DEXAMETHASONE 4 MG PO TABS: ORAL_TABLET | ORAL | 0 refills | Status: AC

## 2024-04-27 MED FILL — Fosaprepitant Dimeglumine For IV Infusion 150 MG (Base Eq): INTRAVENOUS | Qty: 5 | Status: AC

## 2024-04-27 NOTE — Progress Notes (Signed)
 [facility] Cancer Follow up:    Amber Kins, MD [address] [place] [address]   DIAGNOSIS: Cancer Staging  Malignant neoplasm of upper-inner quadrant of left breast in female, estrogen receptor positive (HCC) Staging form: Breast, AJCC 8th Edition - Clinical: Stage IIB (cT2, cN0, cM0, G3, ER+, PR-, HER2-) - Signed by Odean Potts, MD on 01/21/2024 Stage prefix: Initial diagnosis Histologic grading system: 3 grade system - Pathologic: Stage IIA (pT2, pN0, cM0, G3, ER-, PR-, HER2-) - Signed by Crawford Morna Pickle, NP on 04/20/2024 Histologic grading system: 3 grade system    SUMMARY OF ONCOLOGIC HISTORY: Oncology History  Malignant neoplasm of upper-inner quadrant of left breast in female, estrogen receptor positive (HCC)  01/13/2024 Initial Diagnosis   History of left breast cancer: 2001: Lumpectomy and radiation Screening mammogram detected left breast mass by ultrasound 2.2 cm at 9 o'clock position, axilla negative biopsy: Grade 3 IDC ER 85%, PR 0%, HER2 equivalent FISH pending, Ki-67 95%   01/21/2024 Cancer Staging   Staging form: Breast, AJCC 8th Edition - Clinical: Stage IIB (cT2, cN0, cM0, G3, ER+, PR-, HER2-) - Signed by Odean Potts, MD on 01/21/2024 Stage prefix: Initial diagnosis Histologic grading system: 3 grade system   01/27/2024 Genetic Testing   Negative genetic testing The report date is January 27, 2024.  The Multi-Cancer + RNA Panel offered by [facility] includes sequencing and/or deletion/duplication analysis of the following 70 genes:  AIP*, ALK, APC*, ATM*, AXIN2*, BAP1*, BARD1*, BLM*, BMPR1A*, BRCA1*, BRCA2*, BRIP1*, CDC73*, CDH1*, CDK4, CDKN1B*, CDKN2A, CHEK2*, CTNNA1*, DICER1*, EPCAM (del/dup only), EGFR, FH*, FLCN*, GREM1 (promoter dup only), HOXB13, KIT, LZTR1, MAX*, MBD4, MEN1*, MET, MITF, MLH1*, MSH2*, MSH3*, MSH6*, MUTYH*, NF1*, NF2*, NTHL1*, PALB2*, PDGFRA, PMS2*, POLD1*, POLE*, POT1*, PRKAR1A*, PTCH1*, PTEN*,  RAD51C*, RAD51D*, RB1*, RET, SDHA* (sequencing only), SDHAF2*, SDHB*, SDHC*, SDHD*, SMAD4*, SMARCA4*, SMARCB1*, SMARCE1*, STK11*, SUFU*, TMEM127*, TP53*, TSC1*, TSC2*, VHL*. RNA analysis is performed for * genes.    03/24/2024 -  Chemotherapy   Patient is on Treatment Plan : BREAST DOSE DENSE AC q14d / PACLitaxel q7d     04/20/2024 Cancer Staging   Staging form: Breast, AJCC 8th Edition - Pathologic: Stage IIA (pT2, pN0, cM0, G3, ER-, PR-, HER2-) - Signed by Crawford Morna Pickle, NP on 04/20/2024 Histologic grading system: 3 grade system     CURRENT THERAPY: Adriamycin /Cytoxan   INTERVAL HISTORY:  Discussed the use of AI scribe software for clinical note transcription with the patient, who gave verbal consent to proceed.  History of Present Illness Amber Ross is a 69 year old female with stage IIa triple negative breast cancer, status post left mastectomy, who presents for evaluation prior to cycle three of adjuvant chemotherapy.  She is receiving adjuvant Adriamycin  and Cytoxan  with Neulasta  support and has completed two cycles, with cycle three planned today.  After cycle two she developed a pronounced drug-induced rash on the arms, chest, and face beginning on April 14, 2024, without new topical exposures.  The rash resolved near completely after a Medrol  Dosepak. She currently feels well and denies fever, chills, cough, shortness of breath, or palpitations.     Patient Active Problem List   Diagnosis Date Noted   Pneumothorax, right 03/12/2024   Genetic testing 01/29/2024   Family history of breast cancer    Malignant neoplasm of upper-inner quadrant of left breast in female, estrogen receptor positive (HCC) 01/19/2024   Aortic atherosclerosis    PFO (patent foramen ovale)    Agatston coronary artery  calcium  score less than 100    Postoperative breast asymmetry 06/05/2018   History of breast cancer in female 08/10/2015   Upper airway cough syndrome  11/17/2013    has no known allergies.  MEDICAL HISTORY: Past Medical History:  Diagnosis Date   ADHD    Agatston coronary artery calcium  score less than 100    coronary CTA showed minimal plaque in the pLAD 07/2020   Aortic atherosclerosis    Atrophic vaginitis    Breast cancer (HCC)    left breast cancer 20 yrs ago   Coronary artery disease    minimal CAD in LAD   Cough    COVID 05/17/2019   Elevated cholesterol    Elevated cholesterol    Estrogen deficiency    Family history of breast cancer    Gastric reflux    GERD (gastroesophageal reflux disease)    History of breast cancer    Hypertension    Osteopenia    Personal history of radiation therapy    PFO (patent foramen ovale)    small and noted on cardiac CTA   Psoriasis    Sleep apnea    Mild. No CPAP   SOB (shortness of breath)     SURGICAL HISTORY: Past Surgical History:  Procedure Laterality Date   BREAST BIOPSY Left 01/13/2024   US  LT BREAST BX W LOC DEV 1ST LESION IMG BX SPEC US  GUIDE 01/13/2024 [facility] MAMMOGRAPHY   BREAST LUMPECTOMY Left 2001   BREAST REDUCTION SURGERY Right 09/28/2018   Procedure: MAMMARY REDUCTION  (BREAST);  Surgeon: Lowery Estefana RAMAN, DO;  Location: [facility];  Service: Plastics;  Laterality: Right;   CESAREAN SECTION  1992   FACIAL COSMETIC SURGERY     MASTECTOMY W/ SENTINEL NODE BIOPSY Left 02/16/2024   Procedure: MASTECTOMY WITH SENTINEL LYMPH NODE BIOPSY;  Surgeon: Curvin Deward MOULD, MD;  Location: [facility] OR;  Service: General;  Laterality: Left;  GEN w/PEC BLOCK LEFT MASTECTOMY SENTINEL NODE BIOPSY   MASTOPEXY Left 09/28/2018   Procedure: MASTOPEXY;  Surgeon: Lowery Estefana RAMAN, DO;  Location: [facility];  Service: Plastics;  Laterality: Left;   PORTACATH PLACEMENT N/A 03/11/2024   Procedure: INSERTION, TUNNELED CENTRAL VENOUS DEVICE, WITH PORT and ASPIRATION OF SEROMA;  Surgeon: Curvin Deward MOULD, MD;  Location: [facility] OR;  Service: General;  Laterality:  N/A;  PORT PLACEMENT WITH ULTRASOUND GUIDANCE   TUBAL LIGATION      SOCIAL HISTORY: Social History   Socioeconomic History   Marital status: Divorced    Spouse name: Not on file   Number of children: Not on file   Years of education: Not on file   Highest education level: Not on file  Occupational History   Occupation: Mortgage loan processor   Tobacco Use   Smoking status: Former    Current packs/day: 0.00    Average packs/day: 0.3 packs/day for 15.0 years (3.8 ttl pk-yrs)    Types: Cigarettes    Start date: 04/22/1974    Quit date: 04/22/1989    Years since quitting: 35.0   Smokeless tobacco: Never  Vaping Use   Vaping status: Never Used  Substance and Sexual Activity   Alcohol use: Yes    Comment: socially   Drug use: No   Sexual activity: Not Currently    Birth control/protection: Post-menopausal  Other Topics Concern   Not on file  Social History Narrative   Not on file   Social Drivers of Health   Tobacco Use: Medium  Risk (04/27/2024)   Patient History    Smoking Tobacco Use: Former    Smokeless Tobacco Use: Never    Passive Exposure: Not on file  Financial Resource Strain: Low Risk (04/27/2024)   Overall Financial Resource Strain (CARDIA)    Difficulty of Paying Living Expenses: Not hard at all  Food Insecurity: No Food Insecurity (04/27/2024)   Epic    Worried About Programme Researcher, Broadcasting/film/video in the Last Year: Never true    Ran Out of Food in the Last Year: Never true  Transportation Needs: No Transportation Needs (04/27/2024)   Epic    Lack of Transportation (Medical): No    Lack of Transportation (Non-Medical): No  Physical Activity: Inactive (04/27/2024)   Exercise Vital Sign    Days of Exercise per Week: 0 days    Minutes of Exercise per Session: 0 min  Stress: No Stress Concern Present (04/27/2024)   [employer] of Occupational Health - Occupational Stress Questionnaire    Feeling of Stress: Only a little  Social Connections: Socially Isolated (04/27/2024)    Social Connection and Isolation Panel    Frequency of Communication with Friends and Family: More than three times a week    Frequency of Social Gatherings with Friends and Family: Twice a week    Attends Religious Services: Never    Database Administrator or Organizations: No    Attends Banker Meetings: Never    Marital Status: Divorced  Catering Manager Violence: Not At Risk (04/27/2024)   Epic    Fear of Current or Ex-Partner: No    Emotionally Abused: No    Physically Abused: No    Sexually Abused: No  Depression (PHQ2-9): Low Risk (04/27/2024)   Depression (PHQ2-9)    PHQ-2 Score: 0  Alcohol Screen: Low Risk (04/27/2024)   Alcohol Screen    Last Alcohol Screening Score (AUDIT): 0  Housing: Unknown (04/27/2024)   Epic    Unable to Pay for Housing in the Last Year: No    Number of Times Moved in the Last Year: Not on file    Homeless in the Last Year: No  Utilities: Not At Risk (04/27/2024)   Epic    Threatened with loss of utilities: No  Health Literacy: Adequate Health Literacy (04/27/2024)   B1300 Health Literacy    Frequency of need for help with medical instructions: Never    FAMILY HISTORY: Family History  Problem Relation Age of Onset   Dementia Mother    Fibroids Mother    Breast cancer Maternal Aunt    Lung cancer Maternal Aunt    Cancer Maternal Uncle        Nasal   Stroke Maternal Uncle    Breast cancer Half-Sister    Cancer Half-Sister        NOS   Cancer Half-Sister        NOS    Review of Systems  Constitutional:  Negative for appetite change, chills, fatigue, fever and unexpected weight change.  HENT:   Negative for hearing loss, lump/mass and trouble swallowing.   Eyes:  Negative for eye problems and icterus.  Respiratory:  Negative for chest tightness, cough and shortness of breath.   Cardiovascular:  Negative for chest pain, leg swelling and palpitations.  Gastrointestinal:  Negative for abdominal distention, abdominal pain, constipation,  diarrhea, nausea and vomiting.  Endocrine: Negative for hot flashes.  Genitourinary:  Negative for difficulty urinating.   Musculoskeletal:  Negative for arthralgias.  Skin:  Negative  for itching and rash.  Neurological:  Negative for dizziness, extremity weakness, headaches and numbness.  Hematological:  Negative for adenopathy. Does not bruise/bleed easily.  Psychiatric/Behavioral:  Negative for depression. The patient is not nervous/anxious.       PHYSICAL EXAMINATION   Onc Performance Status - 04/27/24 0840       ECOG Perf Status   ECOG Perf Status Restricted in physically strenuous activity but ambulatory and able to carry out work of a light or sedentary nature, e.g., light house work, office work      KPS SCALE   KPS % SCORE Able to carry on normal activity, minor s/s of disease          Vitals:   04/27/24 0819  BP: 111/76  Pulse: (!) 109  Resp: 16  Temp: 97.6 F (36.4 C)  SpO2: 98%    Physical Exam Constitutional:      General: She is not in acute distress.    Appearance: Normal appearance. She is not toxic-appearing.  HENT:     Head: Normocephalic and atraumatic.     Mouth/Throat:     Mouth: Mucous membranes are moist.     Pharynx: Oropharynx is clear. No oropharyngeal exudate or posterior oropharyngeal erythema.  Eyes:     General: No scleral icterus. Cardiovascular:     Rate and Rhythm: Normal rate and regular rhythm.     Pulses: Normal pulses.     Heart sounds: Normal heart sounds.  Pulmonary:     Effort: Pulmonary effort is normal.     Breath sounds: Normal breath sounds.  Abdominal:     General: Abdomen is flat. Bowel sounds are normal. There is no distension.     Palpations: Abdomen is soft.     Tenderness: There is no abdominal tenderness.  Musculoskeletal:        General: No swelling.     Cervical back: Neck supple.  Lymphadenopathy:     Cervical: No cervical adenopathy.  Skin:    General: Skin is warm and dry.     Findings: No rash.      Comments: Mild erythematous rash on arms, almost resolved  Neurological:     General: No focal deficit present.     Mental Status: She is alert.  Psychiatric:        Mood and Affect: Mood normal.        Behavior: Behavior normal.     LABORATORY DATA:  CBC    Component Value Date/Time   WBC 8.5 04/27/2024 0757   WBC 8.2 03/13/2024 0425   RBC 4.24 04/27/2024 0757   HGB 12.2 04/27/2024 0757   HCT 36.7 04/27/2024 0757   PLT 386 04/27/2024 0757   MCV 86.6 04/27/2024 0757   MCV 83.0 08/10/2015 0935   MCH 28.8 04/27/2024 0757   MCHC 33.2 04/27/2024 0757   RDW 15.7 (H) 04/27/2024 0757   LYMPHSABS 1.2 04/27/2024 0757   MONOABS 1.1 (H) 04/27/2024 0757   EOSABS 0.0 04/27/2024 0757   BASOSABS 0.1 04/27/2024 0757    CMP     Component Value Date/Time   NA 137 04/20/2024 1252   NA 139 08/08/2020 1257   K 3.2 (L) 04/20/2024 1252   CL 97 (L) 04/20/2024 1252   CO2 29 04/20/2024 1252   GLUCOSE 113 (H) 04/20/2024 1252   BUN 8 04/20/2024 1252   BUN 15 08/08/2020 1257   CREATININE 0.77 04/20/2024 1252   CALCIUM  10.0 04/20/2024 1252   PROT 7.1 04/20/2024 1252  ALBUMIN  4.2 04/20/2024 1252   AST 16 04/20/2024 1252   ALT 31 04/20/2024 1252   ALKPHOS 88 04/20/2024 1252   BILITOT 0.6 04/20/2024 1252   GFRNONAA >60 04/20/2024 1252     ASSESSMENT and THERAPY PLAN:    Assessment and Plan Assessment & Plan Stage IIa triple negative breast cancer of the left breast, status post left mastectomy, on adjuvant chemotherapy Adjuvant chemotherapy continued with Adriamycin  alone after discontinuing Cytoxan  and Neulasta  due to rash. No alternative to Cytoxan  available. She agreed to proceed.  Dr. Odean reviewed this with Amber Ross who was in agreement. - Discontinued Cytoxan  and Neulasta  due to rash. - Continued Adriamycin  as adjuvant therapy. - Added dexamethasone  for five days post-chemotherapy to reduce rash risk. - Scheduled chemotherapy for tomorrow. - Maintained follow-up  appointment for next week.  Chemotherapy-induced rash Rash likely due to Cytoxan , resolved with Medrol  Dosepak. Rash delayed chemotherapy but resolved before resuming treatment. - Discontinued Cytoxan  and Neulasta . - Added dexamethasone  for five days post-chemotherapy to prevent rash recurrence. - Confirmed rash resolution before resuming chemotherapy.  RTC in 1 week for f/u with Dr. Gudena.     All questions were answered. The patient knows to call the clinic with any problems, questions or concerns. We can certainly see the patient much sooner if necessary.  Total encounter time:20 minutes*in face-to-face visit time, chart review, lab review, care coordination, order entry, and documentation of the encounter time.    Morna Kendall, NP 04/27/2024 8:52 AM Medical Oncology and Hematology [facility] [address] [place], [place] [address] Tel. [phone]    Fax. [phone]  *Total Encounter Time as defined by the Centers for Medicare and Medicaid Services includes, in addition to the face-to-face time of a patient visit (documented in the note above) non-face-to-face time: obtaining and reviewing outside history, ordering and reviewing medications, tests or procedures, care coordination (communications with other health care professionals or caregivers) and documentation in the medical record.

## 2024-04-27 NOTE — Progress Notes (Signed)
 Per MD, pt no longer needing Udenyca . RN contacted [facility] to cancel prescription.

## 2024-04-28 ENCOUNTER — Inpatient Hospital Stay

## 2024-04-28 VITALS — BP 137/76 | HR 100 | Temp 98.0°F | Resp 22

## 2024-04-28 DIAGNOSIS — C50212 Malignant neoplasm of upper-inner quadrant of left female breast: Secondary | ICD-10-CM

## 2024-04-28 DIAGNOSIS — Z5111 Encounter for antineoplastic chemotherapy: Secondary | ICD-10-CM | POA: Diagnosis not present

## 2024-04-28 MED ORDER — SODIUM CHLORIDE 0.9 % IV SOLN
150.0000 mg | Freq: Once | INTRAVENOUS | Status: AC
  Administered 2024-04-28: 150 mg via INTRAVENOUS
  Filled 2024-04-28: qty 150
  Filled 2024-04-28: qty 5

## 2024-04-28 MED ORDER — DEXAMETHASONE SOD PHOSPHATE PF 10 MG/ML IJ SOLN
10.0000 mg | Freq: Once | INTRAMUSCULAR | Status: AC
  Administered 2024-04-28: 10 mg via INTRAVENOUS
  Filled 2024-04-28: qty 1

## 2024-04-28 MED ORDER — PALONOSETRON HCL INJECTION 0.25 MG/5ML
0.2500 mg | Freq: Once | INTRAVENOUS | Status: AC
  Administered 2024-04-28: 0.25 mg via INTRAVENOUS
  Filled 2024-04-28: qty 5

## 2024-04-28 MED ORDER — SODIUM CHLORIDE 0.9 % IV SOLN: INTRAVENOUS | Status: DC

## 2024-04-28 MED ORDER — DOXORUBICIN HCL CHEMO IV INJECTION 2 MG/ML
60.0000 mg/m2 | Freq: Once | INTRAVENOUS | Status: AC
  Administered 2024-04-28: 112 mg via INTRAVENOUS
  Filled 2024-04-28: qty 56

## 2024-04-28 NOTE — Patient Instructions (Signed)
 [facility] - A DEPT OF [facility]  Discharge Instructions: Thank you for choosing [facility] to provide your oncology and hematology care.   If you have a lab appointment with the Cancer Center, please go directly to the Cancer Center and check in at the registration area.   Wear comfortable clothing and clothing appropriate for easy access to any Portacath or PICC line.   We strive to give you quality time with your provider. You may need to reschedule your appointment if you arrive late (15 or more minutes).  Arriving late affects you and other patients whose appointments are after yours.  Also, if you miss three or more appointments without notifying the office, you may be dismissed from the clinic at the provider's discretion.      For prescription refill requests, have your pharmacy contact our office and allow 72 hours for refills to be completed.    Today you received the following chemotherapy and/or immunotherapy agents: Doxorubicin.       To help prevent nausea and vomiting after your treatment, we encourage you to take your nausea medication as directed.  BELOW ARE SYMPTOMS THAT SHOULD BE REPORTED IMMEDIATELY: *FEVER GREATER THAN 100.4 F (38 C) OR HIGHER *CHILLS OR SWEATING *NAUSEA AND VOMITING THAT IS NOT CONTROLLED WITH YOUR NAUSEA MEDICATION *UNUSUAL SHORTNESS OF BREATH *UNUSUAL BRUISING OR BLEEDING *URINARY PROBLEMS (pain or burning when urinating, or frequent urination) *BOWEL PROBLEMS (unusual diarrhea, constipation, pain near the anus) TENDERNESS IN MOUTH AND THROAT WITH OR WITHOUT PRESENCE OF ULCERS (sore throat, sores in mouth, or a toothache) UNUSUAL RASH, SWELLING OR PAIN  UNUSUAL VAGINAL DISCHARGE OR ITCHING   Items with * indicate a potential emergency and should be followed up as soon as possible or go to the Emergency Department if any problems should occur.  Please show the CHEMOTHERAPY ALERT CARD or  IMMUNOTHERAPY ALERT CARD at check-in to the Emergency Department and triage nurse.  Should you have questions after your visit or need to cancel or reschedule your appointment, please contact [facility] - A DEPT OF [facility]  Dept: [phone]  and follow the prompts.  Office hours are 8:00 a.m. to 4:30 p.m. Monday - Friday. Please note that voicemails left after 4:00 p.m. may not be returned until the following business day.  We are closed weekends and major holidays. You have access to a nurse at all times for urgent questions. Please call the main number to the clinic Dept: [phone] and follow the prompts.   For any non-urgent questions, you may also contact your provider using MyChart. We now offer e-Visits for anyone 74 and older to request care online for non-urgent symptoms. For details visit [email or web address].   Also download the MyChart app! Go to the app store, search "MyChart", open the app, select [facility], and log in with your MyChart username and password.

## 2024-04-29 ENCOUNTER — Encounter: Payer: Self-pay | Admitting: *Deleted

## 2024-04-29 ENCOUNTER — Encounter: Payer: Self-pay | Admitting: Hematology and Oncology

## 2024-05-04 ENCOUNTER — Inpatient Hospital Stay: Admitting: Hematology and Oncology

## 2024-05-04 VITALS — BP 139/76 | HR 95 | Temp 97.6°F | Resp 17 | Wt 160.2 lb

## 2024-05-04 DIAGNOSIS — Z171 Estrogen receptor negative status [ER-]: Secondary | ICD-10-CM | POA: Diagnosis not present

## 2024-05-04 DIAGNOSIS — C50412 Malignant neoplasm of upper-outer quadrant of left female breast: Secondary | ICD-10-CM | POA: Diagnosis not present

## 2024-05-04 DIAGNOSIS — Z5111 Encounter for antineoplastic chemotherapy: Secondary | ICD-10-CM | POA: Diagnosis not present

## 2024-05-04 NOTE — Assessment & Plan Note (Signed)
 01/13/2024:History of left breast cancer: 2001: Lumpectomy and radiation Screening mammogram detected left breast mass by ultrasound 2.2 cm at 9 o'clock position, axilla negative biopsy: Grade 3 IDC ER 85%, PR 0%, HER2 2+ by IHC, FISH negative ratio 1.60, Ki-67 95%   02/16/2024: Left mastectomy: Grade 3 IDC 3.6 cm, margins negative, 0/9 lymph nodes, repeat prognostic panel: ER 0%, PR 0%, Ki67 90%, HER2 2+ by IHC, FISH negative Recommendations: 1.  Adjuvant chemotherapy with Adriamycin  Cytoxan  dose dense x 4 followed by Taxol weekly x 12  2.  With mastectomy and prior radiation, no role of additional adjuvant radiation.  With estrogen receptor being negative there is no role of antiestrogen therapy -------------------------------------------------------------------------------------------------------------------------------------------------- Current treatment: Cycle 4 dose dense Adriamycin  and Cytoxan  (Cytoxan  discontinued from cycle 3) Hospitalization 03/12/2024-03/15/2024: Post port placement pneumothorax requiring chest tube Echocardiogram 03/03/2024: EF 55 to 60% Pneumothorax status post chest tube placement and removal after port placement.   Chemotoxicities: Fatigue that lasted for 3 to 4 days Profound rash due to Cytoxan , discontinued with cycle 3 and given extra dexamethasone  Denies any nausea vomiting or abdominal symptoms.   This concludes her chemotherapy plan. No role of radiation or antiestrogen therapy.  Return to clinic in 3 months for survivorship care plan visit.

## 2024-05-04 NOTE — Progress Notes (Signed)
 "  Patient Care Team: Loreli Kins, MD as PCP - General (Family Medicine) Shlomo Wilbert SAUNDERS, MD as PCP - Cardiology (Cardiology) Gerome, Devere HERO, RN as Oncology Nurse Navigator Tyree Nanetta SAILOR, RN as Registered Nurse Curvin Deward MOULD, MD as Consulting Physician (General Surgery) Odean Potts, MD as Consulting Physician (Hematology and Oncology) Izell Domino, MD as Attending Physician (Radiation Oncology)  DIAGNOSIS:  Encounter Diagnosis  Name Primary?   Malignant neoplasm of upper-outer quadrant of left breast in female, estrogen receptor negative (HCC) Yes    SUMMARY OF ONCOLOGIC HISTORY: Oncology History  Malignant neoplasm of upper-outer quadrant of left breast in female, estrogen receptor negative (HCC)  01/13/2024 Initial Diagnosis   History of left breast cancer: 2001: Lumpectomy and radiation Screening mammogram detected left breast mass by ultrasound 2.2 cm at 9 o'clock position, axilla negative biopsy: Grade 3 IDC ER 85%, PR 0%, HER2 equivalent FISH pending, Ki-67 95%   01/21/2024 Cancer Staging   Staging form: Breast, AJCC 8th Edition - Clinical: Stage IIB (cT2, cN0, cM0, G3, ER+, PR-, HER2-) - Signed by Odean Potts, MD on 01/21/2024 Stage prefix: Initial diagnosis Histologic grading system: 3 grade system   01/27/2024 Genetic Testing   Negative genetic testing The report date is January 27, 2024.  The Multi-Cancer + RNA Panel offered by [facility] includes sequencing and/or deletion/duplication analysis of the following 70 genes:  AIP*, ALK, APC*, ATM*, AXIN2*, BAP1*, BARD1*, BLM*, BMPR1A*, BRCA1*, BRCA2*, BRIP1*, CDC73*, CDH1*, CDK4, CDKN1B*, CDKN2A, CHEK2*, CTNNA1*, DICER1*, EPCAM (del/dup only), EGFR, FH*, FLCN*, GREM1 (promoter dup only), HOXB13, KIT, LZTR1, MAX*, MBD4, MEN1*, MET, MITF, MLH1*, MSH2*, MSH3*, MSH6*, MUTYH*, NF1*, NF2*, NTHL1*, PALB2*, PDGFRA, PMS2*, POLD1*, POLE*, POT1*, PRKAR1A*, PTCH1*, PTEN*, RAD51C*, RAD51D*, RB1*, RET, SDHA* (sequencing only),  SDHAF2*, SDHB*, SDHC*, SDHD*, SMAD4*, SMARCA4*, SMARCB1*, SMARCE1*, STK11*, SUFU*, TMEM127*, TP53*, TSC1*, TSC2*, VHL*. RNA analysis is performed for * genes.    03/24/2024 -  Chemotherapy   Patient is on Treatment Plan : BREAST DOSE DENSE AC q14d / PACLitaxel q7d     04/20/2024 Cancer Staging   Staging form: Breast, AJCC 8th Edition - Pathologic: Stage IIA (pT2, pN0, cM0, G3, ER-, PR-, HER2-) - Signed by Crawford Morna Pickle, NP on 04/20/2024 Histologic grading system: 3 grade system     CHIEF COMPLIANT: Follow-up to discuss her treatment plan  HISTORY OF PRESENT ILLNESS: History of Present Illness Belita Milady Fleener is a 69 year old female with stage IIB, ER-negative, HER2-negative invasive ductal carcinoma of the left breast, status post left mastectomy, currently receiving adjuvant Adriamycin -based chemotherapy, who presents for oncology follow-up to review treatment-related complications and laboratory monitoring.  She remains on adjuvant Adriamycin . Cyclophosphamide  was discontinued due to a drug-induced rash, and treatment was previously delayed for a collapsed lung and the rash.  She monitors labs closely. Potassium has been intermittently low to 3.2-3.4 mmol/L but is currently normal. She has constipation and no diarrhea. Non-fasting glucose readings have been elevated, and she understands the need for fasting values. Liver enzymes including AST have stayed within acceptable limits. Platelets have remained above 100,000/L. She denies abnormal bleeding or bruising. She has Udenica available if neutropenia occurs.  She requested confirmation and correction of her chart to reflect ER-negative status per final pathology and asks about post-chemotherapy surveillance, including the role of serial ctDNA testing.  She prefers her daughter involved in her care, has authorized her access to medical information, and prefers not to attend visits alone.      ALLERGIES:  has no known  allergies.  MEDICATIONS:  Current Outpatient Medications  Medication Sig Dispense Refill   acetaminophen  (TYLENOL ) 500 MG tablet Take 1 tablet (500 mg total) by mouth every 6 (six) hours as needed for headache, fever or moderate pain (pain score 4-6).     alum & mag hydroxide-simeth-diphenhydrAMINE -lidocaine  Take by mouth 4 (four) times daily as needed for mouth pain. Suspension contains equal amounts of Maalox, diphenhydramine  and lidocaine . 240 mL 1   amphetamine -dextroamphetamine  (ADDERALL) 10 MG tablet Take 10-20 mg by mouth See admin instructions. Take 20 mg by mouth in the morning and 10 mg in the evening     atorvastatin  (LIPITOR) 40 MG tablet Take 1 tablet (40 mg total) by mouth daily. 30 tablet 0   Biotin w/ Vitamins C & E (HAIR/SKIN/NAILS PO) Take 2 Pieces by mouth See admin instructions. Gummies for hair/skin/mails - Chew 2 gummies by mouth once a day     Cholecalciferol (VITAMIN D3) 125 MCG (5000 UT) CAPS Take 5,000 Units by mouth daily.     dexamethasone  (DECADRON ) 4 MG tablet Take 1 tablet daily x 5 days beginning day after chemo 10 tablet 0   diazepam  (VALIUM ) 2 MG tablet Take 1 tablet (2 mg total) by mouth every 12 (twelve) hours as needed for muscle spasms. 20 tablet 0   lidocaine -prilocaine  (EMLA ) cream Apply to affected area once 30 g 3   lisinopril -hydrochlorothiazide  (ZESTORETIC ) 20-12.5 MG tablet Take 1 tablet by mouth daily.     methylPREDNISolone  (MEDROL  DOSEPAK) 4 MG TBPK tablet Taper 6,5,4,3,2,1 21 tablet 0   omeprazole (PRILOSEC) 20 MG capsule Take 20 mg by mouth daily before breakfast.     ondansetron  (ZOFRAN -ODT) 4 MG disintegrating tablet Take 1 tablet (4 mg total) by mouth every 8 (eight) hours as needed for nausea or vomiting. (Patient taking differently: Take 4 mg by mouth every 8 (eight) hours as needed for nausea or vomiting (dissolve orally).) 20 tablet 0   prochlorperazine  (COMPAZINE ) 10 MG tablet Take 1 tablet (10 mg total) by mouth every 6 (six) hours as  needed for nausea or vomiting. 30 tablet 1   traMADol  (ULTRAM ) 50 MG tablet Take 50 mg by mouth 2 (two) times daily as needed (for coughing).     triamcinolone cream (KENALOG) 0.1 % Apply 1 Application topically 2 (two) times daily as needed (for itching).     venlafaxine  XR (EFFEXOR -XR) 150 MG 24 hr capsule Take 150 mg by mouth daily with breakfast.     vitamin B-12 (CYANOCOBALAMIN) 1000 MCG tablet Take 1,000 mcg by mouth daily.     No current facility-administered medications for this visit.    PHYSICAL EXAMINATION: ECOG PERFORMANCE STATUS: 1 - Symptomatic but completely ambulatory  Vitals:   05/04/24 0833  BP: 139/76  Pulse: 95  Resp: 17  Temp: 97.6 F (36.4 C)  SpO2: 99%   Filed Weights   05/04/24 0833  Weight: 160 lb 3.2 oz (72.7 kg)    LABORATORY DATA:  I have reviewed the data as listed    Latest Ref Rng & Units 04/27/2024    7:57 AM 04/20/2024   12:52 PM 04/07/2024    1:26 PM  CMP  Glucose 70 - 99 mg/dL 875  886  882   BUN 8 - 23 mg/dL 16  8  10    Creatinine 0.44 - 1.00 mg/dL 9.16  9.22  9.27   Sodium 135 - 145 mmol/L 141  137  139   Potassium 3.5 - 5.1 mmol/L 3.5  3.2  3.4  Chloride 98 - 111 mmol/L 101  97  101   CO2 22 - 32 mmol/L 28  29  29    Calcium  8.9 - 10.3 mg/dL 9.5  89.9  9.4   Total Protein 6.5 - 8.1 g/dL 6.8  7.1  6.9   Total Bilirubin 0.0 - 1.2 mg/dL 0.4  0.6  0.4   Alkaline Phos 38 - 126 U/L 71  88  82   AST 15 - 41 U/L 14  16  14    ALT 0 - 44 U/L 29  31  23      Lab Results  Component Value Date   WBC 8.5 04/27/2024   HGB 12.2 04/27/2024   HCT 36.7 04/27/2024   MCV 86.6 04/27/2024   PLT 386 04/27/2024   NEUTROABS 6.0 04/27/2024    ASSESSMENT & PLAN:  Malignant neoplasm of upper-inner quadrant of left breast in female, estrogen receptor positive (HCC) 01/13/2024:History of left breast cancer: 2001: Lumpectomy and radiation Screening mammogram detected left breast mass by ultrasound 2.2 cm at 9 o'clock position, axilla negative biopsy:  Grade 3 IDC ER 85%, PR 0%, HER2 2+ by IHC, FISH negative ratio 1.60, Ki-67 95%   02/16/2024: Left mastectomy: Grade 3 IDC 3.6 cm, margins negative, 0/9 lymph nodes, repeat prognostic panel: ER 0%, PR 0%, Ki67 90%, HER2 2+ by IHC, FISH negative Recommendations: 1.  Adjuvant chemotherapy with Adriamycin  Cytoxan  dose dense x 4 followed by Taxol weekly x 12  2.  With mastectomy and prior radiation, no role of additional adjuvant radiation.  With estrogen receptor being negative there is no role of antiestrogen therapy -------------------------------------------------------------------------------------------------------------------------------------------------- Current treatment: Cycle 4 dose dense Adriamycin  and Cytoxan  (Cytoxan  discontinued from cycle 3) Hospitalization 03/12/2024-03/15/2024: Post port placement pneumothorax requiring chest tube Echocardiogram 03/03/2024: EF 55 to 60% Pneumothorax status post chest tube placement and removal after port placement.   Chemotoxicities: Fatigue that lasted for 3 to 4 days Profound rash due to Cytoxan , discontinued with cycle 3 and given extra dexamethasone .  The rash has nearly completely resolved Denies any nausea vomiting or abdominal symptoms.   Discussion regarding overall treatment plan: Patient had multiple questions regarding some of the abnormalities on her blood work as well as future treatment plan with regards to how her disease will be monitored and so on.  I answered many of her questions.  She will also have a survivorship care plan visit at the end of chemo to have additional questions answered regarding the surveillance plan.  Return to clinic next week for fourth cycle of Adriamycin  and after that she will get weekly Taxol treatments.   No orders of the defined types were placed in this encounter.  The patient has a good understanding of the overall plan. she agrees with it. she will call with any problems that may develop before  the next visit here.  I personally spent a total of 30 minutes in the care of the patient today including preparing to see the patient, getting/reviewing separately obtained history, performing a medically appropriate exam/evaluation, counseling and educating, placing orders, referring and communicating with other health care professionals, documenting clinical information in the EHR, independently interpreting results, communicating results, and coordinating care.   Viinay K Oziel Beitler, MD 05/06/24    "

## 2024-05-05 ENCOUNTER — Inpatient Hospital Stay: Admitting: Hematology and Oncology

## 2024-05-05 ENCOUNTER — Inpatient Hospital Stay

## 2024-05-05 ENCOUNTER — Encounter: Payer: Self-pay | Admitting: Hematology and Oncology

## 2024-05-05 ENCOUNTER — Other Ambulatory Visit: Payer: Self-pay

## 2024-05-06 ENCOUNTER — Other Ambulatory Visit: Payer: Self-pay | Admitting: Hematology and Oncology

## 2024-05-07 ENCOUNTER — Encounter: Payer: Self-pay | Admitting: Hematology and Oncology

## 2024-05-07 ENCOUNTER — Other Ambulatory Visit (HOSPITAL_BASED_OUTPATIENT_CLINIC_OR_DEPARTMENT_OTHER): Payer: Self-pay

## 2024-05-10 ENCOUNTER — Encounter: Payer: Self-pay | Admitting: Hematology and Oncology

## 2024-05-10 ENCOUNTER — Other Ambulatory Visit: Payer: Self-pay | Admitting: *Deleted

## 2024-05-10 ENCOUNTER — Inpatient Hospital Stay

## 2024-05-10 DIAGNOSIS — C50412 Malignant neoplasm of upper-outer quadrant of left female breast: Secondary | ICD-10-CM

## 2024-05-10 DIAGNOSIS — Z5111 Encounter for antineoplastic chemotherapy: Secondary | ICD-10-CM | POA: Diagnosis not present

## 2024-05-10 MED ORDER — SODIUM CHLORIDE 0.9 % IV SOLN: INTRAVENOUS | Status: DC

## 2024-05-10 MED FILL — Fosaprepitant Dimeglumine For IV Infusion 150 MG (Base Eq): INTRAVENOUS | Qty: 5 | Status: AC

## 2024-05-10 NOTE — Progress Notes (Signed)
 Received call from pt with complaint of increase in fatigue, orthostatic hypotension and dehydration, requesting appt for IVF.  RN reviewed with provider and orders received and placed.  Infusion staff notified and appt scheduled. Pt educated and verbalized understanding.

## 2024-05-11 ENCOUNTER — Inpatient Hospital Stay

## 2024-05-11 ENCOUNTER — Encounter: Payer: Self-pay | Admitting: Adult Health

## 2024-05-11 ENCOUNTER — Encounter: Payer: Self-pay | Admitting: Hematology and Oncology

## 2024-05-11 ENCOUNTER — Inpatient Hospital Stay: Admitting: Adult Health

## 2024-05-11 VITALS — BP 108/65 | HR 96 | Temp 98.0°F | Resp 16 | Ht 67.0 in

## 2024-05-11 DIAGNOSIS — C50412 Malignant neoplasm of upper-outer quadrant of left female breast: Secondary | ICD-10-CM

## 2024-05-11 DIAGNOSIS — D701 Agranulocytosis secondary to cancer chemotherapy: Secondary | ICD-10-CM | POA: Diagnosis not present

## 2024-05-11 DIAGNOSIS — T451X5A Adverse effect of antineoplastic and immunosuppressive drugs, initial encounter: Secondary | ICD-10-CM | POA: Insufficient documentation

## 2024-05-11 DIAGNOSIS — Z171 Estrogen receptor negative status [ER-]: Secondary | ICD-10-CM | POA: Diagnosis not present

## 2024-05-11 DIAGNOSIS — Z5111 Encounter for antineoplastic chemotherapy: Secondary | ICD-10-CM | POA: Diagnosis not present

## 2024-05-11 LAB — CMP (CANCER CENTER ONLY)
ALT: 23 U/L (ref 0–44)
AST: 13 U/L — ABNORMAL LOW (ref 15–41)
Albumin: 4.1 g/dL (ref 3.5–5.0)
Alkaline Phosphatase: 55 U/L (ref 38–126)
Anion gap: 11 (ref 5–15)
BUN: 13 mg/dL (ref 8–23)
CO2: 28 mmol/L (ref 22–32)
Calcium: 9.6 mg/dL (ref 8.9–10.3)
Chloride: 101 mmol/L (ref 98–111)
Creatinine: 0.76 mg/dL (ref 0.44–1.00)
GFR, Estimated: >60 mL/min
Glucose, Bld: 127 mg/dL — ABNORMAL HIGH (ref 70–99)
Potassium: 3.3 mmol/L — ABNORMAL LOW (ref 3.5–5.1)
Sodium: 139 mmol/L (ref 135–145)
Total Bilirubin: 0.7 mg/dL (ref 0.0–1.2)
Total Protein: 6.6 g/dL (ref 6.5–8.1)

## 2024-05-11 LAB — CBC WITH DIFFERENTIAL (CANCER CENTER ONLY)
Abs Immature Granulocytes: 0 K/uL (ref 0.00–0.07)
Basophils Absolute: 0 K/uL (ref 0.0–0.1)
Basophils Relative: 1 %
Eosinophils Absolute: 0 K/uL (ref 0.0–0.5)
Eosinophils Relative: 0 %
HCT: 28.7 % — ABNORMAL LOW (ref 36.0–46.0)
Hemoglobin: 9.8 g/dL — ABNORMAL LOW (ref 12.0–15.0)
Immature Granulocytes: 0 %
Lymphocytes Relative: 35 %
Lymphs Abs: 0.3 K/uL — ABNORMAL LOW (ref 0.7–4.0)
MCH: 29 pg (ref 26.0–34.0)
MCHC: 34.1 g/dL (ref 30.0–36.0)
MCV: 84.9 fL (ref 80.0–100.0)
Monocytes Absolute: 0.2 K/uL (ref 0.1–1.0)
Monocytes Relative: 21 %
Neutro Abs: 0.4 K/uL — CL (ref 1.7–7.7)
Neutrophils Relative %: 43 %
Platelet Count: 75 K/uL — ABNORMAL LOW (ref 150–400)
RBC: 3.38 MIL/uL — ABNORMAL LOW (ref 3.87–5.11)
RDW: 16 % — ABNORMAL HIGH (ref 11.5–15.5)
WBC Count: 0.9 K/uL — CL (ref 4.0–10.5)
nRBC: 0 % (ref 0.0–0.2)

## 2024-05-11 MED ORDER — FILGRASTIM-SNDZ 300 MCG/0.5ML IJ SOSY
300.0000 ug | PREFILLED_SYRINGE | Freq: Once | INTRAMUSCULAR | Status: AC
  Administered 2024-05-11: 300 ug via SUBCUTANEOUS
  Filled 2024-05-11: qty 0.5

## 2024-05-11 NOTE — Progress Notes (Addendum)
 Orders entered for Zarxio  300mcg x 3 per Morna Kendall, NP. PA approved per shara team.  Zvi Duplantis, PharmD, MBA

## 2024-05-11 NOTE — Progress Notes (Unsigned)
 [facility] Cancer Follow up:    Amber Kins, MD [address] [place] [address]   DIAGNOSIS: Cancer Staging  Malignant neoplasm of upper-outer quadrant of left breast in female, estrogen receptor negative (HCC) Staging form: Breast, AJCC 8th Edition - Clinical: Stage IIB (cT2, cN0, cM0, G3, ER+, PR-, HER2-) - Signed by Odean Potts, MD on 01/21/2024 Stage prefix: Initial diagnosis Histologic grading system: 3 grade system - Pathologic: Stage IIA (pT2, pN0, cM0, G3, ER-, PR-, HER2-) - Signed by Crawford Morna Pickle, NP on 04/20/2024 Histologic grading system: 3 grade system    SUMMARY OF ONCOLOGIC HISTORY: Oncology History  Malignant neoplasm of upper-outer quadrant of left breast in female, estrogen receptor negative (HCC)  01/13/2024 Initial Diagnosis   History of left breast cancer: 2001: Lumpectomy and radiation Screening mammogram detected left breast mass by ultrasound 2.2 cm at 9 o'clock position, axilla negative biopsy: Grade 3 IDC ER 85%, PR 0%, HER2 equivalent FISH pending, Ki-67 95%   01/21/2024 Cancer Staging   Staging form: Breast, AJCC 8th Edition - Clinical: Stage IIB (cT2, cN0, cM0, G3, ER+, PR-, HER2-) - Signed by Odean Potts, MD on 01/21/2024 Stage prefix: Initial diagnosis Histologic grading system: 3 grade system   01/27/2024 Genetic Testing   Negative genetic testing The report date is January 27, 2024.  The Multi-Cancer + RNA Panel offered by [facility] includes sequencing and/or deletion/duplication analysis of the following 70 genes:  AIP*, ALK, APC*, ATM*, AXIN2*, BAP1*, BARD1*, BLM*, BMPR1A*, BRCA1*, BRCA2*, BRIP1*, CDC73*, CDH1*, CDK4, CDKN1B*, CDKN2A, CHEK2*, CTNNA1*, DICER1*, EPCAM (del/dup only), EGFR, FH*, FLCN*, GREM1 (promoter dup only), HOXB13, KIT, LZTR1, MAX*, MBD4, MEN1*, MET, MITF, MLH1*, MSH2*, MSH3*, MSH6*, MUTYH*, NF1*, NF2*, NTHL1*, PALB2*, PDGFRA, PMS2*, POLD1*, POLE*, POT1*, PRKAR1A*, PTCH1*, PTEN*,  RAD51C*, RAD51D*, RB1*, RET, SDHA* (sequencing only), SDHAF2*, SDHB*, SDHC*, SDHD*, SMAD4*, SMARCA4*, SMARCB1*, SMARCE1*, STK11*, SUFU*, TMEM127*, TP53*, TSC1*, TSC2*, VHL*. RNA analysis is performed for * genes.    03/24/2024 -  Chemotherapy   Patient is on Treatment Plan : BREAST DOSE DENSE AC q14d / PACLitaxel q7d     04/20/2024 Cancer Staging   Staging form: Breast, AJCC 8th Edition - Pathologic: Stage IIA (pT2, pN0, cM0, G3, ER-, PR-, HER2-) - Signed by Crawford Morna Pickle, NP on 04/20/2024 Histologic grading system: 3 grade system     CURRENT THERAPY: Adriamycin   INTERVAL HISTORY:  Discussed the use of AI scribe software for clinical note transcription with the patient, who gave verbal consent to proceed.  History of Present Illness      Patient Active Problem List   Diagnosis Date Noted   Pneumothorax, right 03/12/2024   Genetic testing 01/29/2024   Family history of breast cancer    Malignant neoplasm of upper-outer quadrant of left breast in female, estrogen receptor negative (HCC) 01/19/2024   Aortic atherosclerosis    PFO (patent foramen ovale)    Agatston coronary artery calcium  score less than 100    Postoperative breast asymmetry 06/05/2018   History of breast cancer in female 08/10/2015   Upper airway cough syndrome 11/17/2013    has no known allergies.  MEDICAL HISTORY: Past Medical History:  Diagnosis Date   ADHD    Agatston coronary artery calcium  score less than 100    coronary CTA showed minimal plaque in the pLAD 07/2020   Aortic atherosclerosis    Atrophic vaginitis    Breast cancer (HCC)    left breast cancer 20 yrs ago   Coronary artery  disease    minimal CAD in LAD   Cough    COVID 05/17/2019   Elevated cholesterol    Elevated cholesterol    Estrogen deficiency    Family history of breast cancer    Gastric reflux    GERD (gastroesophageal reflux disease)    History of breast cancer    Hypertension    Osteopenia    Personal  history of radiation therapy    PFO (patent foramen ovale)    small and noted on cardiac CTA   Psoriasis    Sleep apnea    Mild. No CPAP   SOB (shortness of breath)     SURGICAL HISTORY: Past Surgical History:  Procedure Laterality Date   BREAST BIOPSY Left 01/13/2024   US  LT BREAST BX W LOC DEV 1ST LESION IMG BX SPEC US  GUIDE 01/13/2024 [facility] MAMMOGRAPHY   BREAST LUMPECTOMY Left 2001   BREAST REDUCTION SURGERY Right 09/28/2018   Procedure: MAMMARY REDUCTION  (BREAST);  Surgeon: Lowery Estefana RAMAN, DO;  Location: [facility];  Service: Plastics;  Laterality: Right;   CESAREAN SECTION  1992   FACIAL COSMETIC SURGERY     MASTECTOMY W/ SENTINEL NODE BIOPSY Left 02/16/2024   Procedure: MASTECTOMY WITH SENTINEL LYMPH NODE BIOPSY;  Surgeon: Curvin Deward MOULD, MD;  Location: [facility] OR;  Service: General;  Laterality: Left;  GEN w/PEC BLOCK LEFT MASTECTOMY SENTINEL NODE BIOPSY   MASTOPEXY Left 09/28/2018   Procedure: MASTOPEXY;  Surgeon: Lowery Estefana RAMAN, DO;  Location: [facility];  Service: Plastics;  Laterality: Left;   PORTACATH PLACEMENT N/A 03/11/2024   Procedure: INSERTION, TUNNELED CENTRAL VENOUS DEVICE, WITH PORT and ASPIRATION OF SEROMA;  Surgeon: Curvin Deward MOULD, MD;  Location: [facility] OR;  Service: General;  Laterality: N/A;  PORT PLACEMENT WITH ULTRASOUND GUIDANCE   TUBAL LIGATION      SOCIAL HISTORY: Social History   Socioeconomic History   Marital status: Divorced    Spouse name: Not on file   Number of children: Not on file   Years of education: Not on file   Highest education level: Not on file  Occupational History   Occupation: Mortgage loan processor   Tobacco Use   Smoking status: Former    Current packs/day: 0.00    Average packs/day: 0.3 packs/day for 15.0 years (3.8 ttl pk-yrs)    Types: Cigarettes    Start date: 04/22/1974    Quit date: 04/22/1989    Years since quitting: 35.0   Smokeless tobacco: Never  Vaping Use   Vaping status:  Never Used  Substance and Sexual Activity   Alcohol use: Yes    Comment: socially   Drug use: No   Sexual activity: Not Currently    Birth control/protection: Post-menopausal  Other Topics Concern   Not on file  Social History Narrative   Not on file   Social Drivers of Health   Tobacco Use: Medium Risk (05/11/2024)   Patient History    Smoking Tobacco Use: Former    Smokeless Tobacco Use: Never    Passive Exposure: Not on Actuary Strain: Low Risk (04/27/2024)   Overall Financial Resource Strain (CARDIA)    Difficulty of Paying Living Expenses: Not hard at all  Food Insecurity: No Food Insecurity (04/27/2024)   Epic    Worried About Programme Researcher, Broadcasting/film/video in the Last Year: Never true    Ran Out of Food in the Last Year: Never true  Transportation Needs: No Transportation Needs (  04/27/2024)   Epic    Lack of Transportation (Medical): No    Lack of Transportation (Non-Medical): No  Physical Activity: Inactive (04/27/2024)   Exercise Vital Sign    Days of Exercise per Week: 0 days    Minutes of Exercise per Session: 0 min  Stress: No Stress Concern Present (04/27/2024)   [employer] of Occupational Health - Occupational Stress Questionnaire    Feeling of Stress: Only a little  Social Connections: Socially Isolated (04/27/2024)   Social Connection and Isolation Panel    Frequency of Communication with Friends and Family: More than three times a week    Frequency of Social Gatherings with Friends and Family: Twice a week    Attends Religious Services: Never    Database Administrator or Organizations: No    Attends Banker Meetings: Never    Marital Status: Divorced  Catering Manager Violence: Not At Risk (04/27/2024)   Epic    Fear of Current or Ex-Partner: No    Emotionally Abused: No    Physically Abused: No    Sexually Abused: No  Depression (PHQ2-9): Low Risk (05/10/2024)   Depression (PHQ2-9)    PHQ-2 Score: 0  Alcohol Screen: Low Risk (04/27/2024)    Alcohol Screen    Last Alcohol Screening Score (AUDIT): 0  Housing: Unknown (04/27/2024)   Epic    Unable to Pay for Housing in the Last Year: No    Number of Times Moved in the Last Year: Not on file    Homeless in the Last Year: No  Utilities: Not At Risk (04/27/2024)   Epic    Threatened with loss of utilities: No  Health Literacy: Adequate Health Literacy (04/27/2024)   B1300 Health Literacy    Frequency of need for help with medical instructions: Never    FAMILY HISTORY: Family History  Problem Relation Age of Onset   Dementia Mother    Fibroids Mother    Breast cancer Maternal Aunt    Lung cancer Maternal Aunt    Cancer Maternal Uncle        Nasal   Stroke Maternal Uncle    Breast cancer Half-Sister    Cancer Half-Sister        NOS   Cancer Half-Sister        NOS    Review of Systems - Oncology    PHYSICAL EXAMINATION    There were no vitals filed for this visit.  Physical Exam  LABORATORY DATA:  CBC    Component Value Date/Time   WBC 8.5 04/27/2024 0757   WBC 8.2 03/13/2024 0425   RBC 4.24 04/27/2024 0757   HGB 12.2 04/27/2024 0757   HCT 36.7 04/27/2024 0757   PLT 386 04/27/2024 0757   MCV 86.6 04/27/2024 0757   MCV 83.0 08/10/2015 0935   MCH 28.8 04/27/2024 0757   MCHC 33.2 04/27/2024 0757   RDW 15.7 (H) 04/27/2024 0757   LYMPHSABS 1.2 04/27/2024 0757   MONOABS 1.1 (H) 04/27/2024 0757   EOSABS 0.0 04/27/2024 0757   BASOSABS 0.1 04/27/2024 0757    CMP     Component Value Date/Time   NA 141 04/27/2024 0757   NA 139 08/08/2020 1257   K 3.5 04/27/2024 0757   CL 101 04/27/2024 0757   CO2 28 04/27/2024 0757   GLUCOSE 124 (H) 04/27/2024 0757   BUN 16 04/27/2024 0757   BUN 15 08/08/2020 1257   CREATININE 0.83 04/27/2024 0757   CALCIUM  9.5 04/27/2024 0757  PROT 6.8 04/27/2024 0757   ALBUMIN  4.1 04/27/2024 0757   AST 14 (L) 04/27/2024 0757   ALT 29 04/27/2024 0757   ALKPHOS 71 04/27/2024 0757   BILITOT 0.4 04/27/2024 0757   GFRNONAA >60  04/27/2024 0757     ASSESSMENT and THERAPY PLAN:   No problem-specific Assessment & Plan notes found for this encounter.   Assessment and Plan Assessment & Plan       All questions were answered. The patient knows to call the clinic with any problems, questions or concerns. We can certainly see the patient much sooner if necessary.  Total encounter time:*** minutes*in face-to-face visit time, chart review, lab review, care coordination, order entry, and documentation of the encounter time.    Morna Kendall, NP 05/11/24 9:45 AM Medical Oncology and Hematology [facility] [address] [place], [place] [address] Tel. [phone]    Fax. [phone]  *Total Encounter Time as defined by the Centers for Medicare and Medicaid Services includes, in addition to the face-to-face time of a patient visit (documented in the note above) non-face-to-face time: obtaining and reviewing outside history, ordering and reviewing medications, tests or procedures, care coordination (communications with other health care professionals or caregivers) and documentation in the medical record.

## 2024-05-12 ENCOUNTER — Encounter: Payer: Self-pay | Admitting: Adult Health

## 2024-05-12 ENCOUNTER — Inpatient Hospital Stay

## 2024-05-12 ENCOUNTER — Encounter: Payer: Self-pay | Admitting: Hematology and Oncology

## 2024-05-12 VITALS — BP 105/66 | HR 104 | Temp 99.4°F | Resp 18

## 2024-05-12 DIAGNOSIS — Z5111 Encounter for antineoplastic chemotherapy: Secondary | ICD-10-CM | POA: Diagnosis not present

## 2024-05-12 DIAGNOSIS — T451X5A Adverse effect of antineoplastic and immunosuppressive drugs, initial encounter: Secondary | ICD-10-CM

## 2024-05-12 MED ORDER — FILGRASTIM-SNDZ 300 MCG/0.5ML IJ SOSY
300.0000 ug | PREFILLED_SYRINGE | Freq: Once | INTRAMUSCULAR | Status: AC
  Administered 2024-05-12: 300 ug via SUBCUTANEOUS
  Filled 2024-05-12: qty 0.5

## 2024-05-13 ENCOUNTER — Inpatient Hospital Stay

## 2024-05-13 ENCOUNTER — Telehealth: Payer: Self-pay

## 2024-05-13 VITALS — BP 111/69 | HR 110 | Temp 98.3°F | Resp 18

## 2024-05-13 DIAGNOSIS — Z5111 Encounter for antineoplastic chemotherapy: Secondary | ICD-10-CM | POA: Diagnosis not present

## 2024-05-13 DIAGNOSIS — D701 Agranulocytosis secondary to cancer chemotherapy: Secondary | ICD-10-CM

## 2024-05-13 MED ORDER — FILGRASTIM-SNDZ 300 MCG/0.5ML IJ SOSY
300.0000 ug | PREFILLED_SYRINGE | Freq: Once | INTRAMUSCULAR | Status: AC
  Administered 2024-05-13: 300 ug via SUBCUTANEOUS
  Filled 2024-05-13: qty 0.5

## 2024-05-13 NOTE — Telephone Encounter (Signed)
 Per Morna Kendall, Please schedule pt to see her. Scheduled for 05/14/2024 @ 0900.

## 2024-05-14 ENCOUNTER — Encounter: Payer: Self-pay | Admitting: Adult Health

## 2024-05-14 ENCOUNTER — Inpatient Hospital Stay: Admitting: Adult Health

## 2024-05-14 VITALS — BP 116/69 | HR 113 | Temp 98.3°F | Resp 18 | Wt 156.2 lb

## 2024-05-14 DIAGNOSIS — C50412 Malignant neoplasm of upper-outer quadrant of left female breast: Secondary | ICD-10-CM

## 2024-05-14 DIAGNOSIS — C50212 Malignant neoplasm of upper-inner quadrant of left female breast: Secondary | ICD-10-CM | POA: Diagnosis not present

## 2024-05-14 DIAGNOSIS — Z171 Estrogen receptor negative status [ER-]: Secondary | ICD-10-CM

## 2024-05-14 DIAGNOSIS — Z170 Estrogen receptor positive status [ER+]: Secondary | ICD-10-CM

## 2024-05-14 DIAGNOSIS — Z5111 Encounter for antineoplastic chemotherapy: Secondary | ICD-10-CM | POA: Diagnosis not present

## 2024-05-14 MED ORDER — METHYLPREDNISOLONE 4 MG PO TBPK: ORAL_TABLET | ORAL | 1 refills | Status: AC

## 2024-05-14 NOTE — Progress Notes (Signed)
 [facility] Cancer Follow up:    Amber Kins, MD [address] [place] [address]   DIAGNOSIS: Cancer Staging  Malignant neoplasm of upper-outer quadrant of left breast in female, estrogen receptor negative (HCC) Staging form: Breast, AJCC 8th Edition - Clinical: Stage IIB (cT2, cN0, cM0, G3, ER+, PR-, HER2-) - Signed by Odean Potts, MD on 01/21/2024 Stage prefix: Initial diagnosis Histologic grading system: 3 grade system - Pathologic: Stage IIA (pT2, pN0, cM0, G3, ER-, PR-, HER2-) - Signed by Crawford Morna Pickle, NP on 04/20/2024 Histologic grading system: 3 grade system    SUMMARY OF ONCOLOGIC HISTORY: Oncology History  Malignant neoplasm of upper-outer quadrant of left breast in female, estrogen receptor negative (HCC)  01/13/2024 Initial Diagnosis   History of left breast cancer: 2001: Lumpectomy and radiation Screening mammogram detected left breast mass by ultrasound 2.2 cm at 9 o'clock position, axilla negative biopsy: Grade 3 IDC ER 85%, PR 0%, HER2 equivalent FISH pending, Ki-67 95%   01/21/2024 Cancer Staging   Staging form: Breast, AJCC 8th Edition - Clinical: Stage IIB (cT2, cN0, cM0, G3, ER+, PR-, HER2-) - Signed by Odean Potts, MD on 01/21/2024 Stage prefix: Initial diagnosis Histologic grading system: 3 grade system   01/27/2024 Genetic Testing   Negative genetic testing The report date is January 27, 2024.  The Multi-Cancer + RNA Panel offered by [facility] includes sequencing and/or deletion/duplication analysis of the following 70 genes:  AIP*, ALK, APC*, ATM*, AXIN2*, BAP1*, BARD1*, BLM*, BMPR1A*, BRCA1*, BRCA2*, BRIP1*, CDC73*, CDH1*, CDK4, CDKN1B*, CDKN2A, CHEK2*, CTNNA1*, DICER1*, EPCAM (del/dup only), EGFR, FH*, FLCN*, GREM1 (promoter dup only), HOXB13, KIT, LZTR1, MAX*, MBD4, MEN1*, MET, MITF, MLH1*, MSH2*, MSH3*, MSH6*, MUTYH*, NF1*, NF2*, NTHL1*, PALB2*, PDGFRA, PMS2*, POLD1*, POLE*, POT1*, PRKAR1A*, PTCH1*, PTEN*,  RAD51C*, RAD51D*, RB1*, RET, SDHA* (sequencing only), SDHAF2*, SDHB*, SDHC*, SDHD*, SMAD4*, SMARCA4*, SMARCB1*, SMARCE1*, STK11*, SUFU*, TMEM127*, TP53*, TSC1*, TSC2*, VHL*. RNA analysis is performed for * genes.    02/16/2024 Surgery   Left mastectomy: Grade 3 IDC 3.6 cm, margins negative, 0/9 lymph nodes, repeat prognostic panel: ER 0%, PR 0%, Ki67 90%, HER2 2+ by IHC, FISH negative    03/24/2024 -  Chemotherapy   Patient is on Treatment Plan : BREAST DOSE DENSE AC q14d / PACLitaxel q7d     04/20/2024 Cancer Staging   Staging form: Breast, AJCC 8th Edition - Pathologic: Stage IIA (pT2, pN0, cM0, G3, ER-, PR-, HER2-) - Signed by Crawford Morna Pickle, NP on 04/20/2024 Histologic grading system: 3 grade system     CURRENT THERAPY:  INTERVAL HISTORY:  Discussed the use of AI scribe software for clinical note transcription with the patient, who gave verbal consent to proceed.  History of Present Illness Amber Ross is a 69 year old female with left breast cancer undergoing chemotherapy who presents for evaluation of recurrent drug-induced rash and chemotherapy-induced neutropenia.  After starting cyclophosphamide  as part of her chemotherapy regimen, she developed a rash, so cyclophosphamide  was stopped and she missed her last scheduled injection. She subsequently developed neutropenia and received three injections of Xarxia for growth factor support, after which the rash recurred. The rash has improved over several days, shrinking from about twenty inches to a few inches. She has sent photographs of the rash to a consulting physician.  She is frustrated with barriers to obtaining growth factor injections, including insurance coverage issues and poor communication with the coordinating nurse, and has obtained the nurse supervisor's contact information. She also has questions about the  timing and logistics of upcoming chemotherapy appointments given her leave of absence and limited  transportation during bad weather.  She is drinking 35 to 40 ounces of water daily, down from her usual 60 to 80 ounces, and finds it increasingly difficult to stay hydrated as treatment continues. With steroid use she has increased hunger and mood lability without increased energy. She has purchased compression socks in anticipation of possible neuropathy. She has also received an Eugenica injection as part of her treatment regimen.     Patient Active Problem List   Diagnosis Date Noted   Chemotherapy induced neutropenia 05/11/2024   Pneumothorax, right 03/12/2024   Genetic testing 01/29/2024   Family history of breast cancer    Malignant neoplasm of upper-outer quadrant of left breast in female, estrogen receptor negative (HCC) 01/19/2024   Aortic atherosclerosis    PFO (patent foramen ovale)    Agatston coronary artery calcium  score less than 100    Postoperative breast asymmetry 06/05/2018   History of breast cancer in female 08/10/2015   Upper airway cough syndrome 11/17/2013    has no known allergies.  MEDICAL HISTORY: Past Medical History:  Diagnosis Date   ADHD    Agatston coronary artery calcium  score less than 100    coronary CTA showed minimal plaque in the pLAD 07/2020   Aortic atherosclerosis    Atrophic vaginitis    Breast cancer (HCC)    left breast cancer 20 yrs ago   Coronary artery disease    minimal CAD in LAD   Cough    COVID 05/17/2019   Elevated cholesterol    Elevated cholesterol    Estrogen deficiency    Family history of breast cancer    Gastric reflux    GERD (gastroesophageal reflux disease)    History of breast cancer    Hypertension    Osteopenia    Personal history of radiation therapy    PFO (patent foramen ovale)    small and noted on cardiac CTA   Psoriasis    Sleep apnea    Mild. No CPAP   SOB (shortness of breath)     SURGICAL HISTORY: Past Surgical History:  Procedure Laterality Date   BREAST BIOPSY Left 01/13/2024   US  LT  BREAST BX W LOC DEV 1ST LESION IMG BX SPEC US  GUIDE 01/13/2024 [facility] MAMMOGRAPHY   BREAST LUMPECTOMY Left 2001   BREAST REDUCTION SURGERY Right 09/28/2018   Procedure: MAMMARY REDUCTION  (BREAST);  Surgeon: Lowery Estefana RAMAN, DO;  Location: [facility];  Service: Plastics;  Laterality: Right;   CESAREAN SECTION  1992   FACIAL COSMETIC SURGERY     MASTECTOMY W/ SENTINEL NODE BIOPSY Left 02/16/2024   Procedure: MASTECTOMY WITH SENTINEL LYMPH NODE BIOPSY;  Surgeon: Curvin Deward MOULD, MD;  Location: [facility] OR;  Service: General;  Laterality: Left;  GEN w/PEC BLOCK LEFT MASTECTOMY SENTINEL NODE BIOPSY   MASTOPEXY Left 09/28/2018   Procedure: MASTOPEXY;  Surgeon: Lowery Estefana RAMAN, DO;  Location: [facility];  Service: Plastics;  Laterality: Left;   PORTACATH PLACEMENT N/A 03/11/2024   Procedure: INSERTION, TUNNELED CENTRAL VENOUS DEVICE, WITH PORT and ASPIRATION OF SEROMA;  Surgeon: Curvin Deward MOULD, MD;  Location: [facility] OR;  Service: General;  Laterality: N/A;  PORT PLACEMENT WITH ULTRASOUND GUIDANCE   TUBAL LIGATION      SOCIAL HISTORY: Social History   Socioeconomic History   Marital status: Divorced    Spouse name: Not on file   Number of children:  Not on file   Years of education: Not on file   Highest education level: Not on file  Occupational History   Occupation: Mortgage loan processor   Tobacco Use   Smoking status: Former    Current packs/day: 0.00    Average packs/day: 0.3 packs/day for 15.0 years (3.8 ttl pk-yrs)    Types: Cigarettes    Start date: 04/22/1974    Quit date: 04/22/1989    Years since quitting: 35.0   Smokeless tobacco: Never  Vaping Use   Vaping status: Never Used  Substance and Sexual Activity   Alcohol use: Yes    Comment: socially   Drug use: No   Sexual activity: Not Currently    Birth control/protection: Post-menopausal  Other Topics Concern   Not on file  Social History Narrative   Not on file   Social Drivers of Health    Tobacco Use: Medium Risk (05/14/2024)   Patient History    Smoking Tobacco Use: Former    Smokeless Tobacco Use: Never    Passive Exposure: Not on file  Financial Resource Strain: Low Risk (05/11/2024)   Overall Financial Resource Strain (CARDIA)    Difficulty of Paying Living Expenses: Not hard at all  Food Insecurity: No Food Insecurity (05/11/2024)   Epic    Worried About Radiation Protection Practitioner of Food in the Last Year: Never true    Ran Out of Food in the Last Year: Never true  Transportation Needs: No Transportation Needs (05/11/2024)   Epic    Lack of Transportation (Medical): No    Lack of Transportation (Non-Medical): No  Physical Activity: Inactive (05/11/2024)   Exercise Vital Sign    Days of Exercise per Week: 0 days    Minutes of Exercise per Session: 0 min  Stress: No Stress Concern Present (05/11/2024)   [employer] of Occupational Health - Occupational Stress Questionnaire    Feeling of Stress: Not at all  Social Connections: Unknown (05/11/2024)   Social Connection and Isolation Panel    Frequency of Communication with Friends and Family: More than three times a week    Frequency of Social Gatherings with Friends and Family: More than three times a week    Attends Religious Services: Never    Database Administrator or Organizations: No    Attends Banker Meetings: Never    Marital Status: Not on file  Recent Concern: Social Connections - Socially Isolated (04/27/2024)   Social Connection and Isolation Panel    Frequency of Communication with Friends and Family: More than three times a week    Frequency of Social Gatherings with Friends and Family: Twice a week    Attends Religious Services: Never    Database Administrator or Organizations: No    Attends Banker Meetings: Never    Marital Status: Divorced  Catering Manager Violence: Not At Risk (05/11/2024)   Epic    Fear of Current or Ex-Partner: No    Emotionally Abused: No    Physically  Abused: No    Sexually Abused: No  Depression (PHQ2-9): Low Risk (05/11/2024)   Depression (PHQ2-9)    PHQ-2 Score: 0  Alcohol Screen: Low Risk (05/11/2024)   Alcohol Screen    Last Alcohol Screening Score (AUDIT): 0  Housing: Unknown (05/11/2024)   Epic    Unable to Pay for Housing in the Last Year: No    Number of Times Moved in the Last Year: Not on file    Homeless  in the Last Year: No  Utilities: Not At Risk (05/11/2024)   Epic    Threatened with loss of utilities: No  Health Literacy: Adequate Health Literacy (05/11/2024)   B1300 Health Literacy    Frequency of need for help with medical instructions: Never    FAMILY HISTORY: Family History  Problem Relation Age of Onset   Dementia Mother    Fibroids Mother    Breast cancer Maternal Aunt    Lung cancer Maternal Aunt    Cancer Maternal Uncle        Nasal   Stroke Maternal Uncle    Breast cancer Half-Sister    Cancer Half-Sister        NOS   Cancer Half-Sister        NOS    Review of Systems  Constitutional:  Negative for appetite change, chills, fatigue, fever and unexpected weight change.  HENT:   Negative for hearing loss, lump/mass and trouble swallowing.   Eyes:  Negative for eye problems and icterus.  Respiratory:  Negative for chest tightness, cough and shortness of breath.   Cardiovascular:  Negative for chest pain, leg swelling and palpitations.  Gastrointestinal:  Negative for abdominal distention, abdominal pain, constipation, diarrhea, nausea and vomiting.  Endocrine: Negative for hot flashes.  Genitourinary:  Negative for difficulty urinating.   Musculoskeletal:  Negative for arthralgias.  Skin:  Positive for rash. Negative for itching.  Neurological:  Negative for dizziness, extremity weakness, headaches and numbness.  Hematological:  Negative for adenopathy. Does not bruise/bleed easily.  Psychiatric/Behavioral:  Negative for depression. The patient is not nervous/anxious.       PHYSICAL  EXAMINATION    Vitals:   05/14/24 0910  BP: 116/69  Pulse: (!) 113  Resp: 18  Temp: 98.3 F (36.8 C)  SpO2: 96%    Physical Exam Constitutional:      General: She is not in acute distress.    Appearance: Normal appearance. She is not toxic-appearing.  HENT:     Head: Normocephalic and atraumatic.     Mouth/Throat:     Mouth: Mucous membranes are moist.     Pharynx: Oropharynx is clear. No oropharyngeal exudate or posterior oropharyngeal erythema.  Eyes:     General: No scleral icterus. Cardiovascular:     Rate and Rhythm: Normal rate and regular rhythm.     Pulses: Normal pulses.     Heart sounds: Normal heart sounds.  Pulmonary:     Effort: Pulmonary effort is normal.     Breath sounds: Normal breath sounds.  Abdominal:     General: Abdomen is flat. Bowel sounds are normal. There is no distension.     Palpations: Abdomen is soft.     Tenderness: There is no abdominal tenderness.  Musculoskeletal:        General: No swelling.     Cervical back: Neck supple.  Lymphadenopathy:     Cervical: No cervical adenopathy.  Skin:    General: Skin is warm and dry.     Findings: No rash.  Neurological:     General: No focal deficit present.     Mental Status: She is alert.  Psychiatric:        Mood and Affect: Mood normal.        Behavior: Behavior normal.          LABORATORY DATA:  CBC    Component Value Date/Time   WBC 0.9 (LL) 05/11/2024 0941   WBC 8.2 03/13/2024 0425   RBC 3.38 (  L) 05/11/2024 0941   HGB 9.8 (L) 05/11/2024 0941   HCT 28.7 (L) 05/11/2024 0941   PLT 75 (L) 05/11/2024 0941   MCV 84.9 05/11/2024 0941   MCV 83.0 08/10/2015 0935   MCH 29.0 05/11/2024 0941   MCHC 34.1 05/11/2024 0941   RDW 16.0 (H) 05/11/2024 0941   LYMPHSABS 0.3 (L) 05/11/2024 0941   MONOABS 0.2 05/11/2024 0941   EOSABS 0.0 05/11/2024 0941   BASOSABS 0.0 05/11/2024 0941    CMP     Component Value Date/Time   NA 139 05/11/2024 0941   NA 139 08/08/2020 1257   K 3.3  (L) 05/11/2024 0941   CL 101 05/11/2024 0941   CO2 28 05/11/2024 0941   GLUCOSE 127 (H) 05/11/2024 0941   BUN 13 05/11/2024 0941   BUN 15 08/08/2020 1257   CREATININE 0.76 05/11/2024 0941   CALCIUM  9.6 05/11/2024 0941   PROT 6.6 05/11/2024 0941   ALBUMIN  4.1 05/11/2024 0941   AST 13 (L) 05/11/2024 0941   ALT 23 05/11/2024 0941   ALKPHOS 55 05/11/2024 0941   BILITOT 0.7 05/11/2024 0941   GFRNONAA >60 05/11/2024 0941     ASSESSMENT and THERAPY PLAN:   Assessment and Plan Assessment & Plan Drug-induced rash Rash likely growth factor-induced, initially suspected from Cytoxan , however Cytoxan  not given recently then the rash occurred after Zarxio . - Prescribed Medrol  Dosepak  - Will follow up with consulting physician regarding Cytoxan  reintroduction and further management.  Chemotherapy-induced neutropenia Neutropenia developed post-Cytoxan  hold, managed with Xarxia injections. Future chemotherapy scheduling under consideration. - Administered three Zarxio  injections for neutropenia. - Discussed scheduling and safety considerations for future chemotherapy.  Left breast cancer Undergoing chemotherapy for left breast cancer.  - The patient will return next week for labs, follow-up, and her final Adriamycin  and Cytoxan .    All questions were answered. The patient knows to call the clinic with any problems, questions or concerns. We can certainly see the patient much sooner if necessary.  Total encounter time:20 minutes*in face-to-face visit time, chart review, lab review, care coordination, order entry, and documentation of the encounter time.    Morna Kendall, NP 05/14/24 9:24 AM Medical Oncology and Hematology [facility] [address] [place], [place] [address] Tel. [phone]    Fax. [phone]  *Total Encounter Time as defined by the Centers for Medicare and Medicaid Services includes, in addition to the face-to-face time of a patient visit  (documented in the note above) non-face-to-face time: obtaining and reviewing outside history, ordering and reviewing medications, tests or procedures, care coordination (communications with other health care professionals or caregivers) and documentation in the medical record.

## 2024-05-18 ENCOUNTER — Inpatient Hospital Stay: Admitting: Hematology and Oncology

## 2024-05-18 ENCOUNTER — Inpatient Hospital Stay

## 2024-05-18 VITALS — BP 127/87 | HR 99 | Temp 97.9°F | Resp 18 | Wt 155.0 lb

## 2024-05-18 DIAGNOSIS — Z171 Estrogen receptor negative status [ER-]: Secondary | ICD-10-CM

## 2024-05-18 DIAGNOSIS — Z5111 Encounter for antineoplastic chemotherapy: Secondary | ICD-10-CM | POA: Diagnosis not present

## 2024-05-18 DIAGNOSIS — C50412 Malignant neoplasm of upper-outer quadrant of left female breast: Secondary | ICD-10-CM

## 2024-05-18 LAB — CBC WITH DIFFERENTIAL (CANCER CENTER ONLY)
Abs Immature Granulocytes: 0.8 10*3/uL — ABNORMAL HIGH (ref 0.00–0.07)
Basophils Absolute: 0 10*3/uL (ref 0.0–0.1)
Basophils Relative: 1 %
Eosinophils Absolute: 0 10*3/uL (ref 0.0–0.5)
Eosinophils Relative: 0 %
HCT: 36 % (ref 36.0–46.0)
Hemoglobin: 11.7 g/dL — ABNORMAL LOW (ref 12.0–15.0)
Immature Granulocytes: 9 %
Lymphocytes Relative: 18 %
Lymphs Abs: 1.5 10*3/uL (ref 0.7–4.0)
MCH: 28.9 pg (ref 26.0–34.0)
MCHC: 32.5 g/dL (ref 30.0–36.0)
MCV: 88.9 fL (ref 80.0–100.0)
Monocytes Absolute: 1.3 10*3/uL — ABNORMAL HIGH (ref 0.1–1.0)
Monocytes Relative: 16 %
Neutro Abs: 4.8 10*3/uL (ref 1.7–7.7)
Neutrophils Relative %: 56 %
Platelet Count: 224 10*3/uL (ref 150–400)
RBC: 4.05 MIL/uL (ref 3.87–5.11)
RDW: 18.1 % — ABNORMAL HIGH (ref 11.5–15.5)
WBC Count: 8.5 10*3/uL (ref 4.0–10.5)
nRBC: 0.4 % — ABNORMAL HIGH (ref 0.0–0.2)

## 2024-05-18 LAB — CMP (CANCER CENTER ONLY)
ALT: 32 U/L (ref 0–44)
AST: 16 U/L (ref 15–41)
Albumin: 4.5 g/dL (ref 3.5–5.0)
Alkaline Phosphatase: 67 U/L (ref 38–126)
Anion gap: 12 (ref 5–15)
BUN: 19 mg/dL (ref 8–23)
CO2: 29 mmol/L (ref 22–32)
Calcium: 10.1 mg/dL (ref 8.9–10.3)
Chloride: 99 mmol/L (ref 98–111)
Creatinine: 0.75 mg/dL (ref 0.44–1.00)
GFR, Estimated: >60 mL/min
Glucose, Bld: 112 mg/dL — ABNORMAL HIGH (ref 70–99)
Potassium: 3.4 mmol/L — ABNORMAL LOW (ref 3.5–5.1)
Sodium: 140 mmol/L (ref 135–145)
Total Bilirubin: 0.5 mg/dL (ref 0.0–1.2)
Total Protein: 7.2 g/dL (ref 6.5–8.1)

## 2024-05-18 MED ORDER — DEXAMETHASONE SOD PHOSPHATE PF 10 MG/ML IJ SOLN
10.0000 mg | Freq: Once | INTRAMUSCULAR | Status: AC
  Administered 2024-05-18: 10 mg via INTRAVENOUS
  Filled 2024-05-18: qty 1

## 2024-05-18 MED ORDER — SODIUM CHLORIDE 0.9 % IV SOLN: INTRAVENOUS | Status: DC

## 2024-05-18 MED ORDER — PALONOSETRON HCL INJECTION 0.25 MG/5ML
0.2500 mg | Freq: Once | INTRAVENOUS | Status: AC
  Administered 2024-05-18: 0.25 mg via INTRAVENOUS
  Filled 2024-05-18: qty 5

## 2024-05-18 MED ORDER — DOXORUBICIN HCL CHEMO IV INJECTION 2 MG/ML
60.0000 mg/m2 | Freq: Once | INTRAVENOUS | Status: AC
  Administered 2024-05-18: 112 mg via INTRAVENOUS
  Filled 2024-05-18: qty 56

## 2024-05-18 MED ORDER — SODIUM CHLORIDE 0.9 % IV SOLN
150.0000 mg | Freq: Once | INTRAVENOUS | Status: AC
  Administered 2024-05-18: 150 mg via INTRAVENOUS
  Filled 2024-05-18: qty 5
  Filled 2024-05-18: qty 150

## 2024-05-18 NOTE — Patient Instructions (Signed)
 [facility] - A DEPT OF [facility]  Discharge Instructions: Thank you for choosing [facility] to provide your oncology and hematology care.   If you have a lab appointment with the Cancer Center, please go directly to the Cancer Center and check in at the registration area.   Wear comfortable clothing and clothing appropriate for easy access to any Portacath or PICC line.   We strive to give you quality time with your provider. You may need to reschedule your appointment if you arrive late (15 or more minutes).  Arriving late affects you and other patients whose appointments are after yours.  Also, if you miss three or more appointments without notifying the office, you may be dismissed from the clinic at the providers discretion.      For prescription refill requests, have your pharmacy contact our office and allow 72 hours for refills to be completed.    Today you received the following chemotherapy and/or immunotherapy agents Adriamycin       To help prevent nausea and vomiting after your treatment, we encourage you to take your nausea medication as directed.  BELOW ARE SYMPTOMS THAT SHOULD BE REPORTED IMMEDIATELY: *FEVER GREATER THAN 100.4 F (38 C) OR HIGHER *CHILLS OR SWEATING *NAUSEA AND VOMITING THAT IS NOT CONTROLLED WITH YOUR NAUSEA MEDICATION *UNUSUAL SHORTNESS OF BREATH *UNUSUAL BRUISING OR BLEEDING *URINARY PROBLEMS (pain or burning when urinating, or frequent urination) *BOWEL PROBLEMS (unusual diarrhea, constipation, pain near the anus) TENDERNESS IN MOUTH AND THROAT WITH OR WITHOUT PRESENCE OF ULCERS (sore throat, sores in mouth, or a toothache) UNUSUAL RASH, SWELLING OR PAIN  UNUSUAL VAGINAL DISCHARGE OR ITCHING   Items with * indicate a potential emergency and should be followed up as soon as possible or go to the Emergency Department if any problems should occur.  Please show the CHEMOTHERAPY ALERT CARD or IMMUNOTHERAPY  ALERT CARD at check-in to the Emergency Department and triage nurse.  Should you have questions after your visit or need to cancel or reschedule your appointment, please contact [facility] - A DEPT OF [facility]  Dept: [phone]  and follow the prompts.  Office hours are 8:00 a.m. to 4:30 p.m. Monday - Friday. Please note that voicemails left after 4:00 p.m. may not be returned until the following business day.  We are closed weekends and major holidays. You have access to a nurse at all times for urgent questions. Please call the main number to the clinic Dept: [phone] and follow the prompts.   For any non-urgent questions, you may also contact your provider using MyChart. We now offer e-Visits for anyone 7 and older to request care online for non-urgent symptoms. For details visit [email or web address].   Also download the MyChart app! Go to the app store, search MyChart, open the app, select [place], and log in with your MyChart username and password.

## 2024-05-18 NOTE — Progress Notes (Signed)
 "  Patient Care Team: Loreli Kins, MD as PCP - General (Family Medicine) Shlomo Wilbert SAUNDERS, MD as PCP - Cardiology (Cardiology) Gerome, Devere HERO, RN as Oncology Nurse Navigator Tyree Nanetta SAILOR, RN as Registered Nurse Curvin Deward MOULD, MD as Consulting Physician (General Surgery) Odean Potts, MD as Consulting Physician (Hematology and Oncology) Izell Domino, MD as Attending Physician (Radiation Oncology)  DIAGNOSIS:  Encounter Diagnosis  Name Primary?   Malignant neoplasm of upper-outer quadrant of left breast in female, estrogen receptor negative (HCC) Yes    SUMMARY OF ONCOLOGIC HISTORY: Oncology History  Malignant neoplasm of upper-outer quadrant of left breast in female, estrogen receptor negative (HCC)  01/13/2024 Initial Diagnosis   History of left breast cancer: 2001: Lumpectomy and radiation Screening mammogram detected left breast mass by ultrasound 2.2 cm at 9 o'clock position, axilla negative biopsy: Grade 3 IDC ER 85%, PR 0%, HER2 equivalent FISH pending, Ki-67 95%   01/21/2024 Cancer Staging   Staging form: Breast, AJCC 8th Edition - Clinical: Stage IIB (cT2, cN0, cM0, G3, ER+, PR-, HER2-) - Signed by Odean Potts, MD on 01/21/2024 Stage prefix: Initial diagnosis Histologic grading system: 3 grade system   01/27/2024 Genetic Testing   Negative genetic testing The report date is January 27, 2024.  The Multi-Cancer + RNA Panel offered by [facility] includes sequencing and/or deletion/duplication analysis of the following 70 genes:  AIP*, ALK, APC*, ATM*, AXIN2*, BAP1*, BARD1*, BLM*, BMPR1A*, BRCA1*, BRCA2*, BRIP1*, CDC73*, CDH1*, CDK4, CDKN1B*, CDKN2A, CHEK2*, CTNNA1*, DICER1*, EPCAM (del/dup only), EGFR, FH*, FLCN*, GREM1 (promoter dup only), HOXB13, KIT, LZTR1, MAX*, MBD4, MEN1*, MET, MITF, MLH1*, MSH2*, MSH3*, MSH6*, MUTYH*, NF1*, NF2*, NTHL1*, PALB2*, PDGFRA, PMS2*, POLD1*, POLE*, POT1*, PRKAR1A*, PTCH1*, PTEN*, RAD51C*, RAD51D*, RB1*, RET, SDHA* (sequencing only),  SDHAF2*, SDHB*, SDHC*, SDHD*, SMAD4*, SMARCA4*, SMARCB1*, SMARCE1*, STK11*, SUFU*, TMEM127*, TP53*, TSC1*, TSC2*, VHL*. RNA analysis is performed for * genes.    02/16/2024 Surgery   Left mastectomy: Grade 3 IDC 3.6 cm, margins negative, 0/9 lymph nodes, repeat prognostic panel: ER 0%, PR 0%, Ki67 90%, HER2 2+ by IHC, FISH negative    03/24/2024 -  Chemotherapy   Patient is on Treatment Plan : BREAST DOSE DENSE AC q14d / PACLitaxel q7d     04/20/2024 Cancer Staging   Staging form: Breast, AJCC 8th Edition - Pathologic: Stage IIA (pT2, pN0, cM0, G3, ER-, PR-, HER2-) - Signed by Crawford Morna Pickle, NP on 04/20/2024 Histologic grading system: 3 grade system     CHIEF COMPLIANT: Cycle 4 Adriamycin   HISTORY OF PRESENT ILLNESS: Marval is 69 year old with above-mentioned history of breast cancer he is here for her final chemotherapy.  She had a rash after second cycle of Adriamycin  Cytoxan  and Cytoxan  was discontinued but in spite of that she developed rash after third cycle.  Therefore it could be either Adriamycin  or the Udenyca  injection.  However the rash appears to be well-controlled and she is fine to proceed with her final treatment with Adriamycin  and receive the Udenyca  injection as well.     ALLERGIES:  has no known allergies.  MEDICATIONS:  Current Outpatient Medications  Medication Sig Dispense Refill   acetaminophen  (TYLENOL ) 500 MG tablet Take 1 tablet (500 mg total) by mouth every 6 (six) hours as needed for headache, fever or moderate pain (pain score 4-6).     alum & mag hydroxide-simeth-diphenhydrAMINE -lidocaine  Swish and swallow with 5-10 ml by mouth 4 (four) times daily as needed for mouth pain. Suspension contains equal amounts of Maalox, diphenhydramine  and lidocaine . 240  mL 1   amphetamine -dextroamphetamine  (ADDERALL) 10 MG tablet Take 10-20 mg by mouth See admin instructions. Take 20 mg by mouth in the morning and 10 mg in the evening     atorvastatin  (LIPITOR) 40 MG  tablet Take 1 tablet (40 mg total) by mouth daily. 30 tablet 0   Biotin w/ Vitamins C & E (HAIR/SKIN/NAILS PO) Take 2 Pieces by mouth See admin instructions. Gummies for hair/skin/mails - Chew 2 gummies by mouth once a day     Cholecalciferol (VITAMIN D3) 125 MCG (5000 UT) CAPS Take 5,000 Units by mouth daily.     dexamethasone  (DECADRON ) 4 MG tablet Take 1 tablet daily x 5 days beginning day after chemo 10 tablet 0   diazepam  (VALIUM ) 2 MG tablet Take 1 tablet (2 mg total) by mouth every 12 (twelve) hours as needed for muscle spasms. 20 tablet 0   lidocaine -prilocaine  (EMLA ) cream Apply to affected area once 30 g 3   lisinopril -hydrochlorothiazide  (ZESTORETIC ) 20-12.5 MG tablet Take 1 tablet by mouth daily.     methylPREDNISolone  (MEDROL  DOSEPAK) 4 MG TBPK tablet Taper 6,5,4,3,2,1 21 tablet 1   omeprazole (PRILOSEC) 20 MG capsule Take 20 mg by mouth daily before breakfast.     ondansetron  (ZOFRAN -ODT) 4 MG disintegrating tablet Take 1 tablet (4 mg total) by mouth every 8 (eight) hours as needed for nausea or vomiting. (Patient taking differently: Take 4 mg by mouth every 8 (eight) hours as needed for nausea or vomiting (dissolve orally).) 20 tablet 0   prochlorperazine  (COMPAZINE ) 10 MG tablet Take 1 tablet (10 mg total) by mouth every 6 (six) hours as needed for nausea or vomiting. 30 tablet 1   traMADol  (ULTRAM ) 50 MG tablet Take 50 mg by mouth 2 (two) times daily as needed (for coughing).     triamcinolone cream (KENALOG) 0.1 % Apply 1 Application topically 2 (two) times daily as needed (for itching).     venlafaxine  XR (EFFEXOR -XR) 150 MG 24 hr capsule Take 150 mg by mouth daily with breakfast.     vitamin B-12 (CYANOCOBALAMIN) 1000 MCG tablet Take 1,000 mcg by mouth daily.     No current facility-administered medications for this visit.   Facility-Administered Medications Ordered in Other Visits  Medication Dose Route Frequency Provider Last Rate Last Admin   0.9 %  sodium chloride  infusion    Intravenous Continuous Shena Vinluan, MD 500 mL/hr at 05/18/24 1127 New Bag at 05/18/24 1127   DOXOrubicin  (ADRIAMYCIN ) chemo injection 112 mg  60 mg/m2 (Treatment Plan Recorded) Intravenous Once Jeffrey Graefe, MD       fosaprepitant  (EMEND) 150 mg in sodium chloride  0.9 % 145 mL IVPB  150 mg Intravenous Once Lamia Mariner, MD 450 mL/hr at 05/18/24 1214 150 mg at 05/18/24 1214    PHYSICAL EXAMINATION: ECOG PERFORMANCE STATUS: 1 - Symptomatic but completely ambulatory  There were no vitals filed for this visit. There were no vitals filed for this visit. Rash is under good control  LABORATORY DATA:  I have reviewed the data as listed    Latest Ref Rng & Units 05/18/2024   10:28 AM 05/11/2024    9:41 AM 04/27/2024    7:57 AM  CMP  Glucose 70 - 99 mg/dL 887  872  875   BUN 8 - 23 mg/dL 19  13  16    Creatinine 0.44 - 1.00 mg/dL 9.24  9.23  9.16   Sodium 135 - 145 mmol/L 140  139  141   Potassium 3.5 -  5.1 mmol/L 3.4  3.3  3.5   Chloride 98 - 111 mmol/L 99  101  101   CO2 22 - 32 mmol/L 29  28  28    Calcium  8.9 - 10.3 mg/dL 89.8  9.6  9.5   Total Protein 6.5 - 8.1 g/dL 7.2  6.6  6.8   Total Bilirubin 0.0 - 1.2 mg/dL 0.5  0.7  0.4   Alkaline Phos 38 - 126 U/L 67  55  71   AST 15 - 41 U/L 16  13  14    ALT 0 - 44 U/L 32  23  29     Lab Results  Component Value Date   WBC 8.5 05/18/2024   HGB 11.7 (L) 05/18/2024   HCT 36.0 05/18/2024   MCV 88.9 05/18/2024   PLT 224 05/18/2024   NEUTROABS 4.8 05/18/2024    ASSESSMENT & PLAN:  Malignant neoplasm of upper-outer quadrant of left breast in female, estrogen receptor negative (HCC) 01/13/2024:History of left breast cancer: 2001: Lumpectomy and radiation Screening mammogram detected left breast mass by ultrasound 2.2 cm at 9 o'clock position, axilla negative biopsy: Grade 3 IDC ER 85%, PR 0%, HER2 2+ by IHC, FISH negative ratio 1.60, Ki-67 95%   02/16/2024: Left mastectomy: Grade 3 IDC 3.6 cm, margins negative, 0/9 lymph nodes, repeat  prognostic panel: ER 0%, PR 0%, Ki67 90%, HER2 2+ by IHC, FISH negative Recommendations: 1.  Adjuvant chemotherapy with Adriamycin  Cytoxan  dose dense x 4 followed by Taxol weekly x 12  2.  With mastectomy and prior radiation, no role of additional adjuvant radiation.  With estrogen receptor being negative there is no role of antiestrogen therapy -------------------------------------------------------------------------------------------------------------------------------------------------- Current treatment: Cycle 4 dose dense Adriamycin  and Cytoxan  (Cytoxan  discontinued from cycle 3) Hospitalization 03/12/2024-03/15/2024: Post port placement pneumothorax requiring chest tube Echocardiogram 03/03/2024: EF 55 to 60% Pneumothorax status post chest tube placement and removal after port placement.   Chemotoxicities: Fatigue that lasted for 3 to 4 days Profound rash: Felt to be related to Cytoxan  but the rash happened again after cycle 3 and spite of discontinuing Cytoxan .  So therefore it could either be from Adriamycin  or the growth factor injection.  She is okay to proceed with today's treatment with Adriamycin  and Udenyca  injection. Denies any nausea vomiting or abdominal symptoms.   She will come back in 2 weeks for cycle 1 Taxol.  No orders of the defined types were placed in this encounter.  The patient has a good understanding of the overall plan. she agrees with it. she will call with any problems that may develop before the next visit here.  I personally spent a total of 30 minutes in the care of the patient today including preparing to see the patient, getting/reviewing separately obtained history, performing a medically appropriate exam/evaluation, counseling and educating, placing orders, referring and communicating with other health care professionals, documenting clinical information in the EHR, independently interpreting results, communicating results, and coordinating care.   Viinay  K Keyla Milone, MD 05/18/24    "

## 2024-05-18 NOTE — Progress Notes (Signed)
 Per Dr Odean, patient to have NS 500 ml's over 30 minutes today, order entered.

## 2024-05-18 NOTE — Assessment & Plan Note (Signed)
 01/13/2024:History of left breast cancer: 2001: Lumpectomy and radiation Screening mammogram detected left breast mass by ultrasound 2.2 cm at 9 o'clock position, axilla negative biopsy: Grade 3 IDC ER 85%, PR 0%, HER2 2+ by IHC, FISH negative ratio 1.60, Ki-67 95%   02/16/2024: Left mastectomy: Grade 3 IDC 3.6 cm, margins negative, 0/9 lymph nodes, repeat prognostic panel: ER 0%, PR 0%, Ki67 90%, HER2 2+ by IHC, FISH negative Recommendations: 1.  Adjuvant chemotherapy with Adriamycin  Cytoxan  dose dense x 4 followed by Taxol weekly x 12  2.  With mastectomy and prior radiation, no role of additional adjuvant radiation.  With estrogen receptor being negative there is no role of antiestrogen therapy -------------------------------------------------------------------------------------------------------------------------------------------------- Current treatment: Cycle 4 dose dense Adriamycin  and Cytoxan  (Cytoxan  discontinued from cycle 3) Hospitalization 03/12/2024-03/15/2024: Post port placement pneumothorax requiring chest tube Echocardiogram 03/03/2024: EF 55 to 60% Pneumothorax status post chest tube placement and removal after port placement.   Chemotoxicities: Fatigue that lasted for 3 to 4 days Profound rash: Felt to be related to Cytoxan  but the rash happened again after cycle 3 and spite of discontinuing Cytoxan .  So therefore it could either be from Adriamycin  or the growth factor injection. Denies any nausea vomiting or abdominal symptoms.

## 2024-05-19 ENCOUNTER — Inpatient Hospital Stay

## 2024-05-19 ENCOUNTER — Inpatient Hospital Stay: Admitting: Hematology and Oncology

## 2024-05-25 ENCOUNTER — Inpatient Hospital Stay

## 2024-05-25 ENCOUNTER — Inpatient Hospital Stay: Admitting: Adult Health

## 2024-05-27 ENCOUNTER — Encounter: Payer: Self-pay | Admitting: *Deleted

## 2024-06-02 ENCOUNTER — Inpatient Hospital Stay: Admitting: Hematology and Oncology

## 2024-06-02 ENCOUNTER — Inpatient Hospital Stay

## 2024-06-02 ENCOUNTER — Inpatient Hospital Stay: Attending: Hematology and Oncology

## 2024-06-08 ENCOUNTER — Inpatient Hospital Stay: Admitting: Hematology and Oncology

## 2024-06-08 ENCOUNTER — Inpatient Hospital Stay

## 2024-06-15 ENCOUNTER — Inpatient Hospital Stay: Admitting: Hematology and Oncology

## 2024-06-15 ENCOUNTER — Inpatient Hospital Stay

## 2024-06-15 ENCOUNTER — Inpatient Hospital Stay: Admitting: Physician Assistant

## 2024-06-22 ENCOUNTER — Inpatient Hospital Stay: Admitting: Hematology and Oncology

## 2024-06-22 ENCOUNTER — Inpatient Hospital Stay

## 2024-06-29 ENCOUNTER — Inpatient Hospital Stay

## 2024-06-29 ENCOUNTER — Inpatient Hospital Stay: Admitting: Hematology and Oncology

## 2024-07-06 ENCOUNTER — Inpatient Hospital Stay: Admitting: Hematology and Oncology

## 2024-07-06 ENCOUNTER — Inpatient Hospital Stay
